# Patient Record
Sex: Male | Born: 1965 | Race: White | Hispanic: No | Marital: Married | Smoking: Former smoker
Health system: Southern US, Community
[De-identification: ages and names within clinical notes are randomized; demographics above are authoritative.]

## PROBLEM LIST (undated history)

## (undated) DIAGNOSIS — Z87442 Personal history of urinary calculi: Secondary | ICD-10-CM

## (undated) DIAGNOSIS — J45909 Unspecified asthma, uncomplicated: Secondary | ICD-10-CM

## (undated) DIAGNOSIS — N200 Calculus of kidney: Secondary | ICD-10-CM

## (undated) DIAGNOSIS — F32A Depression, unspecified: Secondary | ICD-10-CM

## (undated) DIAGNOSIS — F329 Major depressive disorder, single episode, unspecified: Secondary | ICD-10-CM

## (undated) DIAGNOSIS — N289 Disorder of kidney and ureter, unspecified: Secondary | ICD-10-CM

## (undated) DIAGNOSIS — E785 Hyperlipidemia, unspecified: Secondary | ICD-10-CM

## (undated) DIAGNOSIS — E119 Type 2 diabetes mellitus without complications: Secondary | ICD-10-CM

## (undated) DIAGNOSIS — G4730 Sleep apnea, unspecified: Secondary | ICD-10-CM

## (undated) DIAGNOSIS — Z91199 Patient's noncompliance with other medical treatment and regimen due to unspecified reason: Secondary | ICD-10-CM

## (undated) DIAGNOSIS — K219 Gastro-esophageal reflux disease without esophagitis: Secondary | ICD-10-CM

## (undated) DIAGNOSIS — I10 Essential (primary) hypertension: Secondary | ICD-10-CM

## (undated) DIAGNOSIS — E7800 Pure hypercholesterolemia, unspecified: Secondary | ICD-10-CM

## (undated) DIAGNOSIS — Z9119 Patient's noncompliance with other medical treatment and regimen: Secondary | ICD-10-CM

## (undated) HISTORY — PX: STRABISMUS SURGERY: SHX218

## (undated) HISTORY — DX: Disorder of kidney and ureter, unspecified: N28.9

## (undated) HISTORY — PX: KNEE CARTILAGE SURGERY: SHX688

## (undated) HISTORY — PX: EYE SURGERY: SHX253

## (undated) HISTORY — DX: Hyperlipidemia, unspecified: E78.5

## (undated) HISTORY — DX: Personal history of urinary calculi: Z87.442

## (undated) HISTORY — PX: LEG SURGERY: SHX1003

## (undated) HISTORY — PX: CARDIAC CATHETERIZATION: SHX172

## (undated) HISTORY — PX: CERVICAL SPINE SURGERY: SHX589

## (undated) HISTORY — PX: APPENDECTOMY: SHX54

## (undated) HISTORY — DX: Gastro-esophageal reflux disease without esophagitis: K21.9

## (undated) HISTORY — DX: Essential (primary) hypertension: I10

## (undated) HISTORY — PX: HERNIA REPAIR: SHX51

## (undated) HISTORY — PX: CHOLECYSTECTOMY: SHX55

## (undated) HISTORY — PX: SKIN GRAFT FULL THICKNESS LEG: SUR1299

---
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
## 1998-11-05 ENCOUNTER — Emergency Department (HOSPITAL_COMMUNITY): Admission: EM | Admit: 1998-11-05 | Discharge: 1998-11-05 | Payer: Self-pay | Admitting: Emergency Medicine

## 2000-12-01 ENCOUNTER — Emergency Department (HOSPITAL_COMMUNITY): Admission: EM | Admit: 2000-12-01 | Discharge: 2000-12-01 | Payer: Self-pay | Admitting: Emergency Medicine

## 2000-12-02 ENCOUNTER — Emergency Department (HOSPITAL_COMMUNITY): Admission: EM | Admit: 2000-12-02 | Discharge: 2000-12-02 | Payer: Self-pay | Admitting: Emergency Medicine

## 2002-07-22 HISTORY — PX: CHOLECYSTECTOMY: SHX55

## 2002-07-22 HISTORY — PX: APPENDECTOMY: SHX54

## 2002-08-27 ENCOUNTER — Encounter: Payer: Self-pay | Admitting: Emergency Medicine

## 2002-08-27 ENCOUNTER — Emergency Department (HOSPITAL_COMMUNITY): Admission: EM | Admit: 2002-08-27 | Discharge: 2002-08-27 | Payer: Self-pay | Admitting: Emergency Medicine

## 2002-08-29 ENCOUNTER — Inpatient Hospital Stay (HOSPITAL_COMMUNITY): Admission: EM | Admit: 2002-08-29 | Discharge: 2002-08-31 | Payer: Self-pay | Admitting: Emergency Medicine

## 2002-08-29 ENCOUNTER — Encounter: Payer: Self-pay | Admitting: Urology

## 2002-08-30 ENCOUNTER — Encounter: Payer: Self-pay | Admitting: Urology

## 2002-11-02 ENCOUNTER — Emergency Department (HOSPITAL_COMMUNITY): Admission: EM | Admit: 2002-11-02 | Discharge: 2002-11-02 | Payer: Self-pay | Admitting: Emergency Medicine

## 2002-11-15 ENCOUNTER — Emergency Department (HOSPITAL_COMMUNITY): Admission: EM | Admit: 2002-11-15 | Discharge: 2002-11-15 | Payer: Self-pay | Admitting: *Deleted

## 2002-11-15 ENCOUNTER — Encounter: Payer: Self-pay | Admitting: *Deleted

## 2002-11-17 ENCOUNTER — Ambulatory Visit (HOSPITAL_BASED_OUTPATIENT_CLINIC_OR_DEPARTMENT_OTHER): Admission: RE | Admit: 2002-11-17 | Discharge: 2002-11-17 | Payer: Self-pay | Admitting: Urology

## 2003-02-10 ENCOUNTER — Emergency Department (HOSPITAL_COMMUNITY): Admission: EM | Admit: 2003-02-10 | Discharge: 2003-02-10 | Payer: Self-pay | Admitting: Emergency Medicine

## 2003-02-10 ENCOUNTER — Encounter: Payer: Self-pay | Admitting: Emergency Medicine

## 2003-02-28 ENCOUNTER — Emergency Department (HOSPITAL_COMMUNITY): Admission: EM | Admit: 2003-02-28 | Discharge: 2003-02-28 | Payer: Self-pay | Admitting: Emergency Medicine

## 2003-03-29 ENCOUNTER — Encounter: Admission: RE | Admit: 2003-03-29 | Discharge: 2003-06-27 | Payer: Self-pay | Admitting: Family Medicine

## 2003-09-17 ENCOUNTER — Emergency Department (HOSPITAL_COMMUNITY): Admission: EM | Admit: 2003-09-17 | Discharge: 2003-09-17 | Payer: Self-pay | Admitting: Emergency Medicine

## 2003-09-28 ENCOUNTER — Emergency Department (HOSPITAL_COMMUNITY): Admission: EM | Admit: 2003-09-28 | Discharge: 2003-09-28 | Payer: Self-pay | Admitting: Emergency Medicine

## 2003-10-25 ENCOUNTER — Emergency Department (HOSPITAL_COMMUNITY): Admission: EM | Admit: 2003-10-25 | Discharge: 2003-10-25 | Payer: Self-pay | Admitting: Emergency Medicine

## 2003-12-08 ENCOUNTER — Emergency Department (HOSPITAL_COMMUNITY): Admission: EM | Admit: 2003-12-08 | Discharge: 2003-12-08 | Payer: Self-pay | Admitting: Emergency Medicine

## 2004-04-10 ENCOUNTER — Emergency Department (HOSPITAL_COMMUNITY): Admission: EM | Admit: 2004-04-10 | Discharge: 2004-04-10 | Payer: Self-pay | Admitting: Emergency Medicine

## 2004-04-19 ENCOUNTER — Emergency Department (HOSPITAL_COMMUNITY): Admission: EM | Admit: 2004-04-19 | Discharge: 2004-04-19 | Payer: Self-pay | Admitting: Emergency Medicine

## 2004-07-18 ENCOUNTER — Emergency Department (HOSPITAL_COMMUNITY): Admission: EM | Admit: 2004-07-18 | Discharge: 2004-07-19 | Payer: Self-pay | Admitting: Internal Medicine

## 2004-07-25 ENCOUNTER — Emergency Department (HOSPITAL_COMMUNITY): Admission: EM | Admit: 2004-07-25 | Discharge: 2004-07-25 | Payer: Self-pay | Admitting: *Deleted

## 2004-10-03 ENCOUNTER — Emergency Department (HOSPITAL_COMMUNITY): Admission: EM | Admit: 2004-10-03 | Discharge: 2004-10-03 | Payer: Self-pay | Admitting: Emergency Medicine

## 2004-10-17 ENCOUNTER — Emergency Department (HOSPITAL_COMMUNITY): Admission: EM | Admit: 2004-10-17 | Discharge: 2004-10-17 | Payer: Self-pay | Admitting: Emergency Medicine

## 2005-03-09 ENCOUNTER — Emergency Department (HOSPITAL_COMMUNITY): Admission: EM | Admit: 2005-03-09 | Discharge: 2005-03-09 | Payer: Self-pay | Admitting: Emergency Medicine

## 2005-03-14 ENCOUNTER — Emergency Department (HOSPITAL_COMMUNITY): Admission: EM | Admit: 2005-03-14 | Discharge: 2005-03-14 | Payer: Self-pay | Admitting: Emergency Medicine

## 2005-05-30 ENCOUNTER — Emergency Department (HOSPITAL_COMMUNITY): Admission: EM | Admit: 2005-05-30 | Discharge: 2005-05-30 | Payer: Self-pay | Admitting: Emergency Medicine

## 2005-07-15 ENCOUNTER — Emergency Department (HOSPITAL_COMMUNITY): Admission: EM | Admit: 2005-07-15 | Discharge: 2005-07-15 | Payer: Self-pay | Admitting: Emergency Medicine

## 2005-07-16 ENCOUNTER — Emergency Department (HOSPITAL_COMMUNITY): Admission: EM | Admit: 2005-07-16 | Discharge: 2005-07-16 | Payer: Self-pay | Admitting: *Deleted

## 2005-08-26 ENCOUNTER — Emergency Department (HOSPITAL_COMMUNITY): Admission: EM | Admit: 2005-08-26 | Discharge: 2005-08-26 | Payer: Self-pay | Admitting: Emergency Medicine

## 2005-10-04 ENCOUNTER — Emergency Department (HOSPITAL_COMMUNITY): Admission: EM | Admit: 2005-10-04 | Discharge: 2005-10-05 | Payer: Self-pay | Admitting: Emergency Medicine

## 2005-10-06 ENCOUNTER — Emergency Department (HOSPITAL_COMMUNITY): Admission: EM | Admit: 2005-10-06 | Discharge: 2005-10-07 | Payer: Self-pay | Admitting: Emergency Medicine

## 2005-12-02 ENCOUNTER — Emergency Department (HOSPITAL_COMMUNITY): Admission: EM | Admit: 2005-12-02 | Discharge: 2005-12-02 | Payer: Self-pay | Admitting: Emergency Medicine

## 2006-01-02 ENCOUNTER — Emergency Department (HOSPITAL_COMMUNITY): Admission: EM | Admit: 2006-01-02 | Discharge: 2006-01-02 | Payer: Self-pay | Admitting: Emergency Medicine

## 2006-01-05 ENCOUNTER — Emergency Department (HOSPITAL_COMMUNITY): Admission: EM | Admit: 2006-01-05 | Discharge: 2006-01-06 | Payer: Self-pay | Admitting: Emergency Medicine

## 2006-01-28 ENCOUNTER — Emergency Department (HOSPITAL_COMMUNITY): Admission: EM | Admit: 2006-01-28 | Discharge: 2006-01-28 | Payer: Self-pay | Admitting: Emergency Medicine

## 2006-02-22 ENCOUNTER — Emergency Department (HOSPITAL_COMMUNITY): Admission: EM | Admit: 2006-02-22 | Discharge: 2006-02-22 | Payer: Self-pay | Admitting: Emergency Medicine

## 2006-02-28 ENCOUNTER — Emergency Department (HOSPITAL_COMMUNITY): Admission: EM | Admit: 2006-02-28 | Discharge: 2006-02-28 | Payer: Self-pay | Admitting: Emergency Medicine

## 2006-03-04 ENCOUNTER — Emergency Department (HOSPITAL_COMMUNITY): Admission: EM | Admit: 2006-03-04 | Discharge: 2006-03-04 | Payer: Self-pay | Admitting: Emergency Medicine

## 2006-03-06 ENCOUNTER — Emergency Department (HOSPITAL_COMMUNITY): Admission: EM | Admit: 2006-03-06 | Discharge: 2006-03-06 | Payer: Self-pay | Admitting: Emergency Medicine

## 2006-07-01 ENCOUNTER — Emergency Department (HOSPITAL_COMMUNITY): Admission: EM | Admit: 2006-07-01 | Discharge: 2006-07-01 | Payer: Self-pay | Admitting: Emergency Medicine

## 2006-09-05 ENCOUNTER — Emergency Department (HOSPITAL_COMMUNITY): Admission: EM | Admit: 2006-09-05 | Discharge: 2006-09-05 | Payer: Self-pay | Admitting: Emergency Medicine

## 2006-09-13 ENCOUNTER — Emergency Department (HOSPITAL_COMMUNITY): Admission: EM | Admit: 2006-09-13 | Discharge: 2006-09-13 | Payer: Self-pay | Admitting: Emergency Medicine

## 2006-12-03 ENCOUNTER — Emergency Department (HOSPITAL_COMMUNITY): Admission: EM | Admit: 2006-12-03 | Discharge: 2006-12-03 | Payer: Self-pay | Admitting: *Deleted

## 2006-12-19 ENCOUNTER — Emergency Department (HOSPITAL_COMMUNITY): Admission: EM | Admit: 2006-12-19 | Discharge: 2006-12-19 | Payer: Self-pay | Admitting: Emergency Medicine

## 2007-01-19 ENCOUNTER — Emergency Department (HOSPITAL_COMMUNITY): Admission: EM | Admit: 2007-01-19 | Discharge: 2007-01-19 | Payer: Self-pay | Admitting: Emergency Medicine

## 2007-04-05 ENCOUNTER — Emergency Department (HOSPITAL_COMMUNITY): Admission: EM | Admit: 2007-04-05 | Discharge: 2007-04-05 | Payer: Self-pay | Admitting: Emergency Medicine

## 2007-05-07 ENCOUNTER — Emergency Department (HOSPITAL_COMMUNITY): Admission: EM | Admit: 2007-05-07 | Discharge: 2007-05-07 | Payer: Self-pay | Admitting: Emergency Medicine

## 2007-07-29 ENCOUNTER — Emergency Department (HOSPITAL_COMMUNITY): Admission: EM | Admit: 2007-07-29 | Discharge: 2007-07-29 | Payer: Self-pay | Admitting: Emergency Medicine

## 2007-11-26 ENCOUNTER — Encounter: Payer: Self-pay | Admitting: Endocrinology

## 2007-12-01 ENCOUNTER — Encounter: Payer: Self-pay | Admitting: Endocrinology

## 2007-12-08 ENCOUNTER — Telehealth: Payer: Self-pay | Admitting: Endocrinology

## 2007-12-15 ENCOUNTER — Encounter: Payer: Self-pay | Admitting: Endocrinology

## 2007-12-29 ENCOUNTER — Encounter: Payer: Self-pay | Admitting: Endocrinology

## 2008-01-06 ENCOUNTER — Telehealth: Payer: Self-pay | Admitting: Endocrinology

## 2008-01-13 ENCOUNTER — Telehealth: Payer: Self-pay | Admitting: Endocrinology

## 2008-01-19 ENCOUNTER — Telehealth: Payer: Self-pay | Admitting: Endocrinology

## 2008-01-20 ENCOUNTER — Telehealth: Payer: Self-pay | Admitting: Endocrinology

## 2008-01-26 ENCOUNTER — Encounter: Payer: Self-pay | Admitting: Endocrinology

## 2008-01-26 ENCOUNTER — Ambulatory Visit (HOSPITAL_COMMUNITY): Admission: RE | Admit: 2008-01-26 | Discharge: 2008-01-26 | Payer: Self-pay | Admitting: Internal Medicine

## 2008-02-01 ENCOUNTER — Encounter: Payer: Self-pay | Admitting: Endocrinology

## 2008-02-23 ENCOUNTER — Encounter: Payer: Self-pay | Admitting: Endocrinology

## 2008-04-28 ENCOUNTER — Ambulatory Visit: Payer: Self-pay | Admitting: Endocrinology

## 2008-04-28 DIAGNOSIS — R609 Edema, unspecified: Secondary | ICD-10-CM | POA: Insufficient documentation

## 2008-04-28 DIAGNOSIS — E785 Hyperlipidemia, unspecified: Secondary | ICD-10-CM | POA: Insufficient documentation

## 2008-04-28 DIAGNOSIS — Z87442 Personal history of urinary calculi: Secondary | ICD-10-CM | POA: Insufficient documentation

## 2008-04-28 DIAGNOSIS — E109 Type 1 diabetes mellitus without complications: Secondary | ICD-10-CM | POA: Insufficient documentation

## 2008-04-28 DIAGNOSIS — Z9189 Other specified personal risk factors, not elsewhere classified: Secondary | ICD-10-CM | POA: Insufficient documentation

## 2008-04-28 DIAGNOSIS — I00 Rheumatic fever without heart involvement: Secondary | ICD-10-CM | POA: Insufficient documentation

## 2008-04-28 DIAGNOSIS — I10 Essential (primary) hypertension: Secondary | ICD-10-CM | POA: Insufficient documentation

## 2008-05-06 ENCOUNTER — Inpatient Hospital Stay (HOSPITAL_COMMUNITY): Admission: EM | Admit: 2008-05-06 | Discharge: 2008-05-10 | Payer: Self-pay | Admitting: Emergency Medicine

## 2008-05-19 ENCOUNTER — Ambulatory Visit: Payer: Self-pay | Admitting: Endocrinology

## 2008-07-01 ENCOUNTER — Inpatient Hospital Stay (HOSPITAL_COMMUNITY): Admission: AD | Admit: 2008-07-01 | Discharge: 2008-07-04 | Payer: Self-pay | Admitting: Internal Medicine

## 2008-07-01 ENCOUNTER — Ambulatory Visit: Payer: Self-pay | Admitting: Cardiology

## 2008-07-28 ENCOUNTER — Ambulatory Visit: Payer: Self-pay | Admitting: Cardiology

## 2008-09-11 ENCOUNTER — Observation Stay (HOSPITAL_COMMUNITY): Admission: EM | Admit: 2008-09-11 | Discharge: 2008-09-13 | Payer: Self-pay | Admitting: Emergency Medicine

## 2008-09-11 ENCOUNTER — Ambulatory Visit: Payer: Self-pay | Admitting: Cardiology

## 2008-09-12 ENCOUNTER — Encounter (INDEPENDENT_AMBULATORY_CARE_PROVIDER_SITE_OTHER): Payer: Self-pay | Admitting: Family Medicine

## 2008-09-16 ENCOUNTER — Ambulatory Visit: Payer: Self-pay | Admitting: Cardiology

## 2008-10-29 ENCOUNTER — Emergency Department (HOSPITAL_COMMUNITY): Admission: EM | Admit: 2008-10-29 | Discharge: 2008-10-29 | Payer: Self-pay | Admitting: Emergency Medicine

## 2008-11-29 ENCOUNTER — Ambulatory Visit (HOSPITAL_COMMUNITY): Admission: RE | Admit: 2008-11-29 | Discharge: 2008-11-29 | Payer: Self-pay | Admitting: Family Medicine

## 2008-12-20 ENCOUNTER — Emergency Department (HOSPITAL_COMMUNITY): Admission: EM | Admit: 2008-12-20 | Discharge: 2008-12-20 | Payer: Self-pay | Admitting: Emergency Medicine

## 2008-12-27 ENCOUNTER — Inpatient Hospital Stay (HOSPITAL_COMMUNITY): Admission: EM | Admit: 2008-12-27 | Discharge: 2009-01-03 | Payer: Self-pay | Admitting: Emergency Medicine

## 2009-01-02 ENCOUNTER — Ambulatory Visit: Payer: Self-pay | Admitting: Psychiatry

## 2009-01-03 ENCOUNTER — Inpatient Hospital Stay (HOSPITAL_COMMUNITY): Admission: RE | Admit: 2009-01-03 | Discharge: 2009-01-09 | Payer: Self-pay | Admitting: Psychiatry

## 2009-01-18 ENCOUNTER — Emergency Department (HOSPITAL_COMMUNITY): Admission: EM | Admit: 2009-01-18 | Discharge: 2009-01-18 | Payer: Self-pay | Admitting: Emergency Medicine

## 2009-01-24 ENCOUNTER — Emergency Department (HOSPITAL_COMMUNITY): Admission: EM | Admit: 2009-01-24 | Discharge: 2009-01-24 | Payer: Self-pay | Admitting: Emergency Medicine

## 2009-04-20 ENCOUNTER — Emergency Department (HOSPITAL_COMMUNITY): Admission: EM | Admit: 2009-04-20 | Discharge: 2009-04-20 | Payer: Self-pay | Admitting: Emergency Medicine

## 2009-07-25 ENCOUNTER — Emergency Department (HOSPITAL_COMMUNITY): Admission: EM | Admit: 2009-07-25 | Discharge: 2009-07-25 | Payer: Self-pay | Admitting: Emergency Medicine

## 2009-10-30 ENCOUNTER — Emergency Department (HOSPITAL_COMMUNITY): Admission: EM | Admit: 2009-10-30 | Discharge: 2009-10-31 | Payer: Self-pay | Admitting: Emergency Medicine

## 2009-10-31 ENCOUNTER — Inpatient Hospital Stay (HOSPITAL_COMMUNITY): Admission: EM | Admit: 2009-10-31 | Discharge: 2009-11-03 | Payer: Self-pay | Admitting: Psychiatry

## 2009-10-31 ENCOUNTER — Ambulatory Visit: Payer: Self-pay | Admitting: Psychiatry

## 2010-08-07 IMAGING — CR DG FOOT COMPLETE 3+V*R*
3 series · 3 of 3 positions shown · non-contrast
Comparison: None

CLINICAL DATA: Trauma

RIGHT FOOT COMPLETE - 3+ VIEW

[view not recorded (1 of 3)]
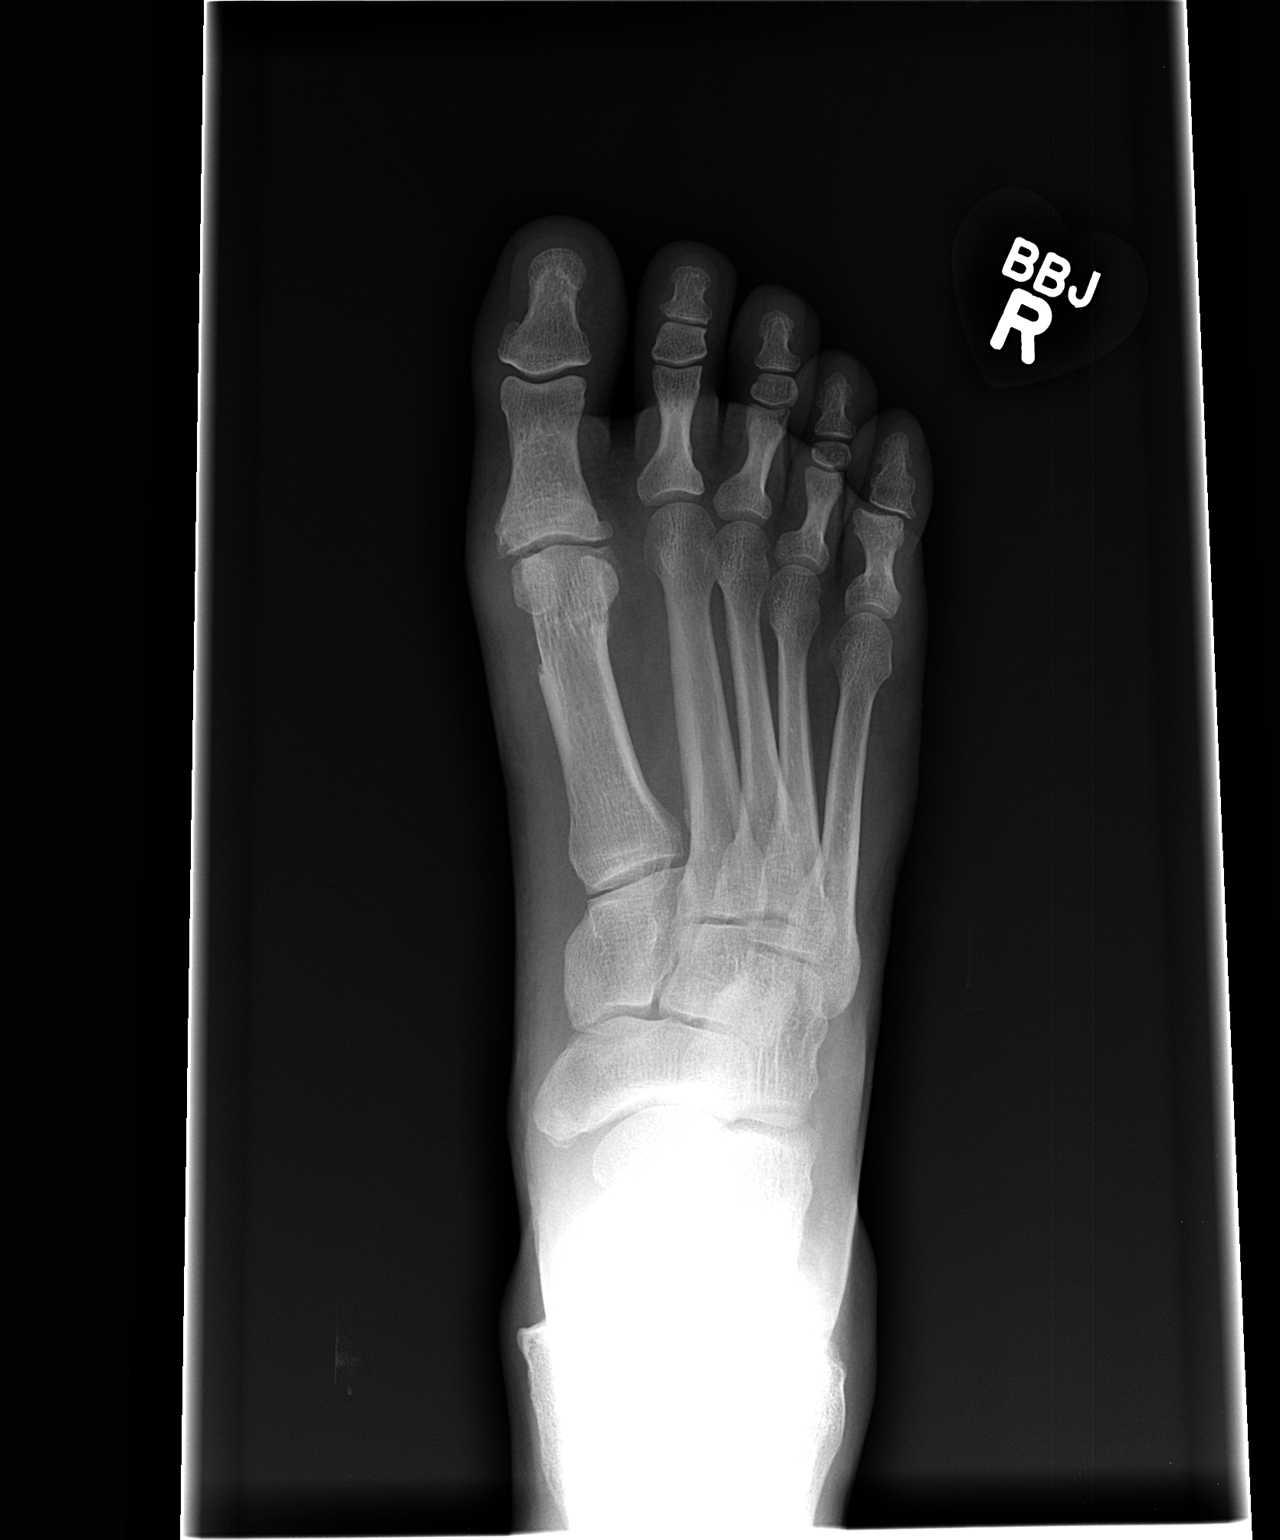

[view not recorded (2 of 3)]
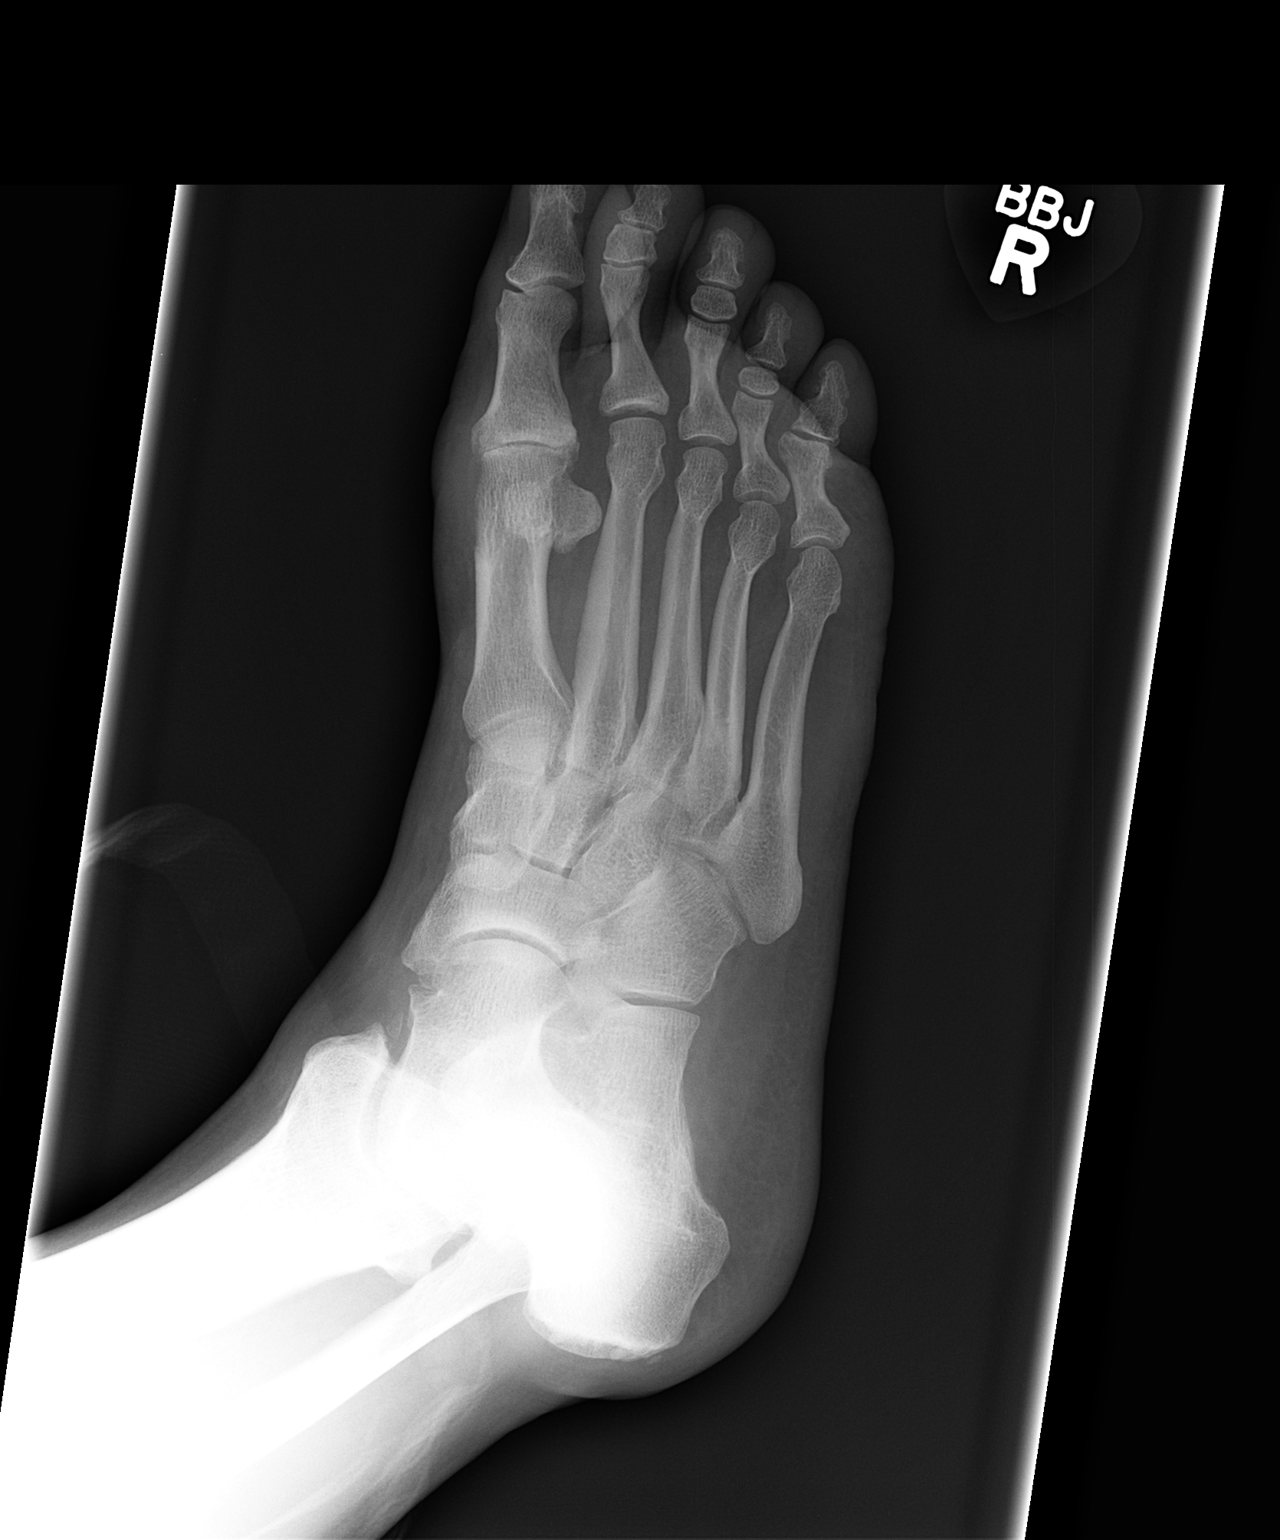

[view not recorded (3 of 3)]
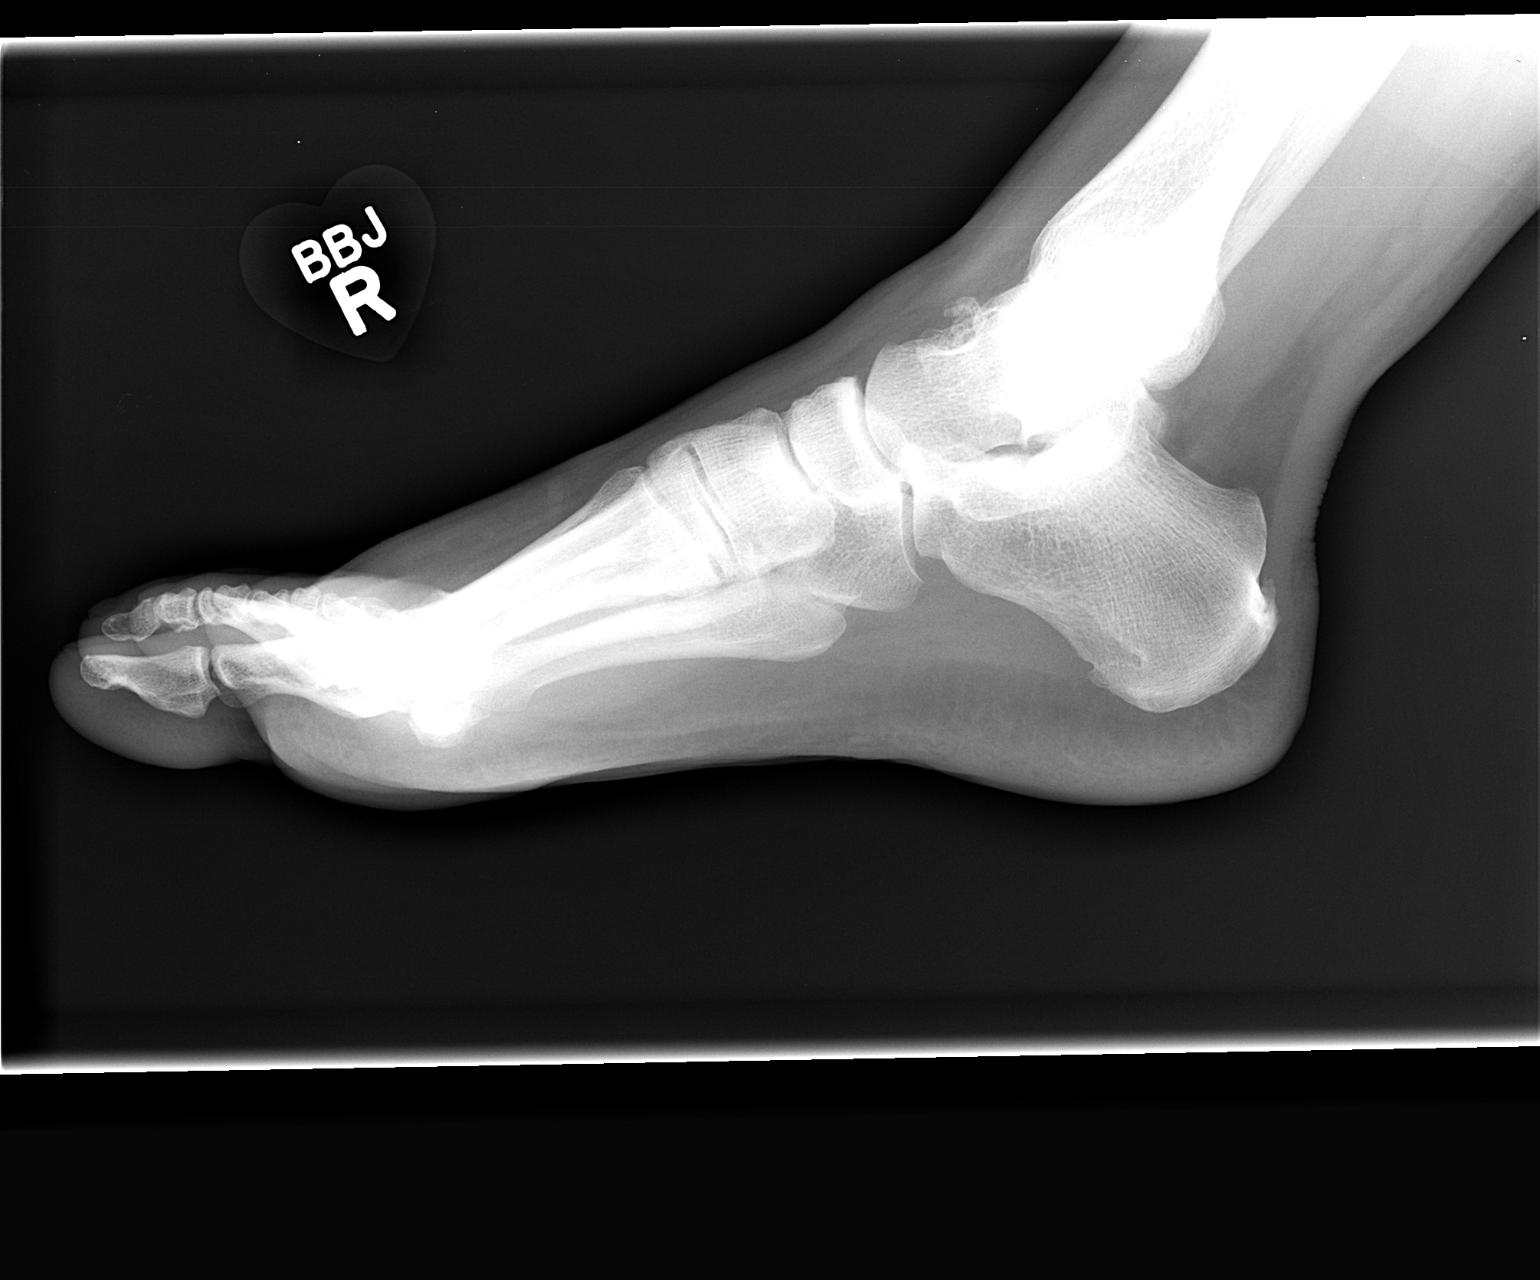

[3 of 3 positions shown; findings below may reference images not displayed]

FINDINGS: First metatarsal phalangeal degenerative changes.  Soft
tissue swelling medially.  No fracture or radiopaque soft tissue
abnormality.
IMPRESSION: Degenerative changes first MTP joint.  No fracture.

## 2010-10-06 LAB — URINALYSIS, ROUTINE W REFLEX MICROSCOPIC
Nitrite: NEGATIVE
Protein, ur: NEGATIVE mg/dL
Specific Gravity, Urine: 1.02 (ref 1.005–1.030)
Urobilinogen, UA: 0.2 mg/dL (ref 0.0–1.0)

## 2010-10-06 LAB — COMPREHENSIVE METABOLIC PANEL
BUN: 16 mg/dL (ref 6–23)
CO2: 27 mEq/L (ref 19–32)
Chloride: 104 mEq/L (ref 96–112)
Creatinine, Ser: 1.08 mg/dL (ref 0.4–1.5)
GFR calc non Af Amer: >60 mL/min (ref >60)
Glucose, Bld: 82 mg/dL (ref 70–99)
Total Bilirubin: 0.3 mg/dL (ref 0.3–1.2)

## 2010-10-06 LAB — GLUCOSE, CAPILLARY
Glucose-Capillary: 224 mg/dL — ABNORMAL HIGH (ref 70–99)
Glucose-Capillary: 78 mg/dL (ref 70–99)

## 2010-10-09 LAB — GLUCOSE, CAPILLARY: Glucose-Capillary: 153 mg/dL — ABNORMAL HIGH (ref 70–99)

## 2010-10-10 LAB — COMPREHENSIVE METABOLIC PANEL
Albumin: 4.1 g/dL (ref 3.5–5.2)
BUN: 14 mg/dL (ref 6–23)
Calcium: 9.5 mg/dL (ref 8.4–10.5)
Creatinine, Ser: 0.97 mg/dL (ref 0.4–1.5)
Glucose, Bld: 235 mg/dL — ABNORMAL HIGH (ref 70–99)
Potassium: 3.5 mEq/L (ref 3.5–5.1)
Total Protein: 7.6 g/dL (ref 6.0–8.3)

## 2010-10-10 LAB — URINALYSIS, ROUTINE W REFLEX MICROSCOPIC
Leukocytes, UA: NEGATIVE
Nitrite: NEGATIVE
Urobilinogen, UA: 0.2 mg/dL (ref 0.0–1.0)
pH: 5 (ref 5.0–8.0)

## 2010-10-10 LAB — CBC
HCT: 45.3 % (ref 39.0–52.0)
Hemoglobin: 15.6 g/dL (ref 13.0–17.0)
MCHC: 34.4 g/dL (ref 30.0–36.0)
MCV: 88.8 fL (ref 78.0–100.0)
Platelets: 258 10*3/uL (ref 150–400)
RDW: 12.9 % (ref 11.5–15.5)

## 2010-10-10 LAB — GLUCOSE, CAPILLARY
Glucose-Capillary: 200 mg/dL — ABNORMAL HIGH (ref 70–99)
Glucose-Capillary: 202 mg/dL — ABNORMAL HIGH (ref 70–99)
Glucose-Capillary: 207 mg/dL — ABNORMAL HIGH (ref 70–99)
Glucose-Capillary: 217 mg/dL — ABNORMAL HIGH (ref 70–99)
Glucose-Capillary: 241 mg/dL — ABNORMAL HIGH (ref 70–99)
Glucose-Capillary: 276 mg/dL — ABNORMAL HIGH (ref 70–99)
Glucose-Capillary: 322 mg/dL — ABNORMAL HIGH (ref 70–99)
Glucose-Capillary: 335 mg/dL — ABNORMAL HIGH (ref 70–99)

## 2010-10-10 LAB — DIFFERENTIAL
Basophils Relative: 1 % (ref 0–1)
Lymphocytes Relative: 40 % (ref 12–46)
Lymphs Abs: 4.1 10*3/uL — ABNORMAL HIGH (ref 0.7–4.0)
Monocytes Absolute: 0.8 10*3/uL (ref 0.1–1.0)
Monocytes Relative: 8 % (ref 3–12)
Neutro Abs: 5.1 10*3/uL (ref 1.7–7.7)
Neutrophils Relative %: 50 % (ref 43–77)

## 2010-10-10 LAB — RAPID URINE DRUG SCREEN, HOSP PERFORMED
Amphetamines: NOT DETECTED
Cocaine: NOT DETECTED
Tetrahydrocannabinol: NOT DETECTED

## 2010-10-10 LAB — ETHANOL: Alcohol, Ethyl (B): <5 mg/dL (ref 0–10)

## 2010-10-28 LAB — BASIC METABOLIC PANEL
BUN: 13 mg/dL (ref 6–23)
Chloride: 105 mEq/L (ref 96–112)
GFR calc Af Amer: >60 mL/min (ref >60)
GFR calc non Af Amer: >60 mL/min (ref >60)
Potassium: 3.8 mEq/L (ref 3.5–5.1)
Sodium: 139 mEq/L (ref 135–145)

## 2010-10-28 LAB — URINALYSIS, ROUTINE W REFLEX MICROSCOPIC
Glucose, UA: 500 mg/dL — AB
Hgb urine dipstick: NEGATIVE
Leukocytes, UA: NEGATIVE
pH: 6 (ref 5.0–8.0)

## 2010-10-28 LAB — CBC
HCT: 44.2 % (ref 39.0–52.0)
Hemoglobin: 15.5 g/dL (ref 13.0–17.0)
MCV: 88.7 fL (ref 78.0–100.0)
Platelets: 255 10*3/uL (ref 150–400)
RBC: 4.98 MIL/uL (ref 4.22–5.81)
WBC: 9.2 10*3/uL (ref 4.0–10.5)

## 2010-10-28 LAB — RAPID URINE DRUG SCREEN, HOSP PERFORMED
Amphetamines: NOT DETECTED
Barbiturates: NOT DETECTED
Cocaine: NOT DETECTED
Opiates: NOT DETECTED

## 2010-10-28 LAB — DIFFERENTIAL
Eosinophils Relative: 2 % (ref 0–5)
Lymphocytes Relative: 30 % (ref 12–46)
Lymphs Abs: 2.7 10*3/uL (ref 0.7–4.0)
Monocytes Relative: 7 % (ref 3–12)

## 2010-10-28 LAB — GLUCOSE, CAPILLARY

## 2010-10-28 LAB — URINE MICROSCOPIC-ADD ON

## 2010-10-28 LAB — ETHANOL: Alcohol, Ethyl (B): <5 mg/dL (ref 0–10)

## 2010-10-29 LAB — CBC
HCT: 40.4 % (ref 39.0–52.0)
HCT: 43.1 % (ref 39.0–52.0)
Hemoglobin: 14.9 g/dL (ref 13.0–17.0)
MCHC: 34.5 g/dL (ref 30.0–36.0)
MCV: 89.8 fL (ref 78.0–100.0)
MCV: 89.9 fL (ref 78.0–100.0)
Platelets: 310 10*3/uL (ref 150–400)
RBC: 4.5 MIL/uL (ref 4.22–5.81)
RBC: 4.77 MIL/uL (ref 4.22–5.81)
WBC: 13.2 10*3/uL — ABNORMAL HIGH (ref 4.0–10.5)
WBC: 7.3 10*3/uL (ref 4.0–10.5)

## 2010-10-29 LAB — GLUCOSE, CAPILLARY
Glucose-Capillary: 160 mg/dL — ABNORMAL HIGH (ref 70–99)
Glucose-Capillary: 182 mg/dL — ABNORMAL HIGH (ref 70–99)
Glucose-Capillary: 194 mg/dL — ABNORMAL HIGH (ref 70–99)
Glucose-Capillary: 207 mg/dL — ABNORMAL HIGH (ref 70–99)
Glucose-Capillary: 247 mg/dL — ABNORMAL HIGH (ref 70–99)
Glucose-Capillary: 295 mg/dL — ABNORMAL HIGH (ref 70–99)
Glucose-Capillary: 385 mg/dL — ABNORMAL HIGH (ref 70–99)
Glucose-Capillary: 102 mg/dL — ABNORMAL HIGH (ref 70–99)
Glucose-Capillary: 107 mg/dL — ABNORMAL HIGH (ref 70–99)
Glucose-Capillary: 117 mg/dL — ABNORMAL HIGH (ref 70–99)
Glucose-Capillary: 121 mg/dL — ABNORMAL HIGH (ref 70–99)
Glucose-Capillary: 122 mg/dL — ABNORMAL HIGH (ref 70–99)
Glucose-Capillary: 129 mg/dL — ABNORMAL HIGH (ref 70–99)
Glucose-Capillary: 130 mg/dL — ABNORMAL HIGH (ref 70–99)
Glucose-Capillary: 131 mg/dL — ABNORMAL HIGH (ref 70–99)
Glucose-Capillary: 132 mg/dL — ABNORMAL HIGH (ref 70–99)
Glucose-Capillary: 135 mg/dL — ABNORMAL HIGH (ref 70–99)
Glucose-Capillary: 137 mg/dL — ABNORMAL HIGH (ref 70–99)
Glucose-Capillary: 137 mg/dL — ABNORMAL HIGH (ref 70–99)
Glucose-Capillary: 144 mg/dL — ABNORMAL HIGH (ref 70–99)
Glucose-Capillary: 144 mg/dL — ABNORMAL HIGH (ref 70–99)
Glucose-Capillary: 144 mg/dL — ABNORMAL HIGH (ref 70–99)
Glucose-Capillary: 144 mg/dL — ABNORMAL HIGH (ref 70–99)
Glucose-Capillary: 148 mg/dL — ABNORMAL HIGH (ref 70–99)
Glucose-Capillary: 153 mg/dL — ABNORMAL HIGH (ref 70–99)
Glucose-Capillary: 156 mg/dL — ABNORMAL HIGH (ref 70–99)
Glucose-Capillary: 160 mg/dL — ABNORMAL HIGH (ref 70–99)
Glucose-Capillary: 160 mg/dL — ABNORMAL HIGH (ref 70–99)
Glucose-Capillary: 170 mg/dL — ABNORMAL HIGH (ref 70–99)
Glucose-Capillary: 181 mg/dL — ABNORMAL HIGH (ref 70–99)
Glucose-Capillary: 185 mg/dL — ABNORMAL HIGH (ref 70–99)
Glucose-Capillary: 191 mg/dL — ABNORMAL HIGH (ref 70–99)
Glucose-Capillary: 208 mg/dL — ABNORMAL HIGH (ref 70–99)
Glucose-Capillary: 212 mg/dL — ABNORMAL HIGH (ref 70–99)
Glucose-Capillary: 225 mg/dL — ABNORMAL HIGH (ref 70–99)
Glucose-Capillary: 236 mg/dL — ABNORMAL HIGH (ref 70–99)
Glucose-Capillary: 240 mg/dL — ABNORMAL HIGH (ref 70–99)
Glucose-Capillary: 274 mg/dL — ABNORMAL HIGH (ref 70–99)
Glucose-Capillary: 48 mg/dL — ABNORMAL LOW (ref 70–99)
Glucose-Capillary: 54 mg/dL — ABNORMAL LOW (ref 70–99)
Glucose-Capillary: 57 mg/dL — ABNORMAL LOW (ref 70–99)
Glucose-Capillary: 58 mg/dL — ABNORMAL LOW (ref 70–99)
Glucose-Capillary: 60 mg/dL — ABNORMAL LOW (ref 70–99)
Glucose-Capillary: 61 mg/dL — ABNORMAL LOW (ref 70–99)
Glucose-Capillary: 62 mg/dL — ABNORMAL LOW (ref 70–99)
Glucose-Capillary: 64 mg/dL — ABNORMAL LOW (ref 70–99)
Glucose-Capillary: 66 mg/dL — ABNORMAL LOW (ref 70–99)
Glucose-Capillary: 75 mg/dL (ref 70–99)
Glucose-Capillary: 76 mg/dL (ref 70–99)
Glucose-Capillary: 79 mg/dL (ref 70–99)
Glucose-Capillary: 81 mg/dL (ref 70–99)
Glucose-Capillary: 83 mg/dL (ref 70–99)
Glucose-Capillary: 84 mg/dL (ref 70–99)
Glucose-Capillary: 84 mg/dL (ref 70–99)
Glucose-Capillary: 87 mg/dL (ref 70–99)
Glucose-Capillary: 93 mg/dL (ref 70–99)
Glucose-Capillary: 95 mg/dL (ref 70–99)
Glucose-Capillary: 99 mg/dL (ref 70–99)
Glucose-Capillary: 185 mg/dL — ABNORMAL HIGH (ref 70–99)
Glucose-Capillary: 210 mg/dL — ABNORMAL HIGH (ref 70–99)

## 2010-10-29 LAB — DIFFERENTIAL
Basophils Relative: 0 % (ref 0–1)
Eosinophils Absolute: 0.1 10*3/uL (ref 0.0–0.7)
Eosinophils Absolute: 0.1 10*3/uL (ref 0.0–0.7)
Eosinophils Absolute: 0.2 10*3/uL (ref 0.0–0.7)
Lymphocytes Relative: 41 % (ref 12–46)
Lymphs Abs: 2.7 10*3/uL (ref 0.7–4.0)
Lymphs Abs: 3 10*3/uL (ref 0.7–4.0)
Lymphs Abs: 3.2 10*3/uL (ref 0.7–4.0)
Monocytes Relative: 13 % — ABNORMAL HIGH (ref 3–12)
Neutro Abs: 7 10*3/uL (ref 1.7–7.7)
Neutrophils Relative %: 44 % (ref 43–77)
Neutrophils Relative %: 63 % (ref 43–77)
Neutrophils Relative %: 69 % (ref 43–77)

## 2010-10-29 LAB — BASIC METABOLIC PANEL
BUN: 11 mg/dL (ref 6–23)
BUN: 5 mg/dL — ABNORMAL LOW (ref 6–23)
BUN: 9 mg/dL (ref 6–23)
BUN: 9 mg/dL (ref 6–23)
CO2: 27 mEq/L (ref 19–32)
CO2: 33 mEq/L — ABNORMAL HIGH (ref 19–32)
Calcium: 9.3 mg/dL (ref 8.4–10.5)
Chloride: 102 mEq/L (ref 96–112)
Chloride: 102 mEq/L (ref 96–112)
Chloride: 107 mEq/L (ref 96–112)
Creatinine, Ser: 0.86 mg/dL (ref 0.4–1.5)
Creatinine, Ser: 0.92 mg/dL (ref 0.4–1.5)
Creatinine, Ser: 0.99 mg/dL (ref 0.4–1.5)
GFR calc Af Amer: >60 mL/min (ref >60)
GFR calc Af Amer: >60 mL/min (ref >60)
GFR calc Af Amer: >60 mL/min (ref >60)
GFR calc non Af Amer: >60 mL/min (ref >60)
Glucose, Bld: 127 mg/dL — ABNORMAL HIGH (ref 70–99)
Potassium: 3.7 mEq/L (ref 3.5–5.1)
Potassium: 3.9 mEq/L (ref 3.5–5.1)
Potassium: 4 mEq/L (ref 3.5–5.1)

## 2010-10-29 LAB — URINALYSIS, ROUTINE W REFLEX MICROSCOPIC
Bilirubin Urine: NEGATIVE
Leukocytes, UA: NEGATIVE
Nitrite: NEGATIVE
Specific Gravity, Urine: >1.03 — ABNORMAL HIGH (ref 1.005–1.030)
Urobilinogen, UA: 0.2 mg/dL (ref 0.0–1.0)
pH: 6 (ref 5.0–8.0)

## 2010-10-29 LAB — COMPREHENSIVE METABOLIC PANEL
ALT: 22 U/L (ref 0–53)
Alkaline Phosphatase: 89 U/L (ref 39–117)
BUN: 11 mg/dL (ref 6–23)
CO2: 26 mEq/L (ref 19–32)
Calcium: 9.7 mg/dL (ref 8.4–10.5)
GFR calc non Af Amer: >60 mL/min (ref >60)
Glucose, Bld: 54 mg/dL — ABNORMAL LOW (ref 70–99)
Sodium: 142 mEq/L (ref 135–145)

## 2010-10-29 LAB — HEMOGLOBIN AND HEMATOCRIT, BLOOD
HCT: 40.2 % (ref 39.0–52.0)
Hemoglobin: 14.1 g/dL (ref 13.0–17.0)

## 2010-10-29 LAB — URINE MICROSCOPIC-ADD ON

## 2010-10-29 LAB — ACETAMINOPHEN LEVEL: Acetaminophen (Tylenol), Serum: <10 ug/mL — ABNORMAL LOW (ref 10–30)

## 2010-10-29 LAB — MAGNESIUM: Magnesium: 1.9 mg/dL (ref 1.5–2.5)

## 2010-10-29 LAB — SALICYLATE LEVEL: Salicylate Lvl: <4 mg/dL (ref 2.8–20.0)

## 2010-10-31 LAB — DIFFERENTIAL
Basophils Absolute: 0 10*3/uL (ref 0.0–0.1)
Basophils Relative: 0 % (ref 0–1)
Neutro Abs: 6.7 10*3/uL (ref 1.7–7.7)
Neutrophils Relative %: 70 % (ref 43–77)

## 2010-10-31 LAB — CBC
HCT: 40.9 % (ref 39.0–52.0)
Hemoglobin: 14.5 g/dL (ref 13.0–17.0)
RDW: 13.4 % (ref 11.5–15.5)

## 2010-10-31 LAB — GLUCOSE, CAPILLARY

## 2010-10-31 LAB — COMPREHENSIVE METABOLIC PANEL
Alkaline Phosphatase: 95 U/L (ref 39–117)
BUN: 11 mg/dL (ref 6–23)
GFR calc non Af Amer: >60 mL/min (ref >60)
Glucose, Bld: 119 mg/dL — ABNORMAL HIGH (ref 70–99)
Potassium: 2.9 mEq/L — ABNORMAL LOW (ref 3.5–5.1)
Total Bilirubin: 0.5 mg/dL (ref 0.3–1.2)
Total Protein: 6.6 g/dL (ref 6.0–8.3)

## 2010-10-31 LAB — LIPASE, BLOOD: Lipase: 31 U/L (ref 11–59)

## 2010-11-06 LAB — COMPREHENSIVE METABOLIC PANEL
ALT: 71 U/L — ABNORMAL HIGH (ref 0–53)
AST: 40 U/L — ABNORMAL HIGH (ref 0–37)
CO2: 24 mEq/L (ref 19–32)
Chloride: 102 mEq/L (ref 96–112)
GFR calc Af Amer: >60 mL/min (ref >60)
GFR calc non Af Amer: >60 mL/min (ref >60)
Sodium: 137 mEq/L (ref 135–145)
Total Bilirubin: 0.4 mg/dL (ref 0.3–1.2)

## 2010-11-06 LAB — MAGNESIUM: Magnesium: 2 mg/dL (ref 1.5–2.5)

## 2010-11-06 LAB — PHOSPHORUS: Phosphorus: 2.6 mg/dL (ref 2.3–4.6)

## 2010-11-06 LAB — HOMOCYSTEINE: Homocysteine: 4.6 umol/L (ref 4.0–15.4)

## 2010-11-06 LAB — HEPATIC FUNCTION PANEL
Albumin: 3.4 g/dL — ABNORMAL LOW (ref 3.5–5.2)
Alkaline Phosphatase: 110 U/L (ref 39–117)
Total Bilirubin: 0.4 mg/dL (ref 0.3–1.2)
Total Protein: 6.5 g/dL (ref 6.0–8.3)

## 2010-11-06 LAB — CARDIAC PANEL(CRET KIN+CKTOT+MB+TROPI)
CK, MB: 1 ng/mL (ref 0.3–4.0)
Relative Index: INVALID (ref 0.0–2.5)
Relative Index: INVALID (ref 0.0–2.5)
Relative Index: INVALID (ref 0.0–2.5)
Total CK: 69 U/L (ref 7–232)
Troponin I: <0.01 ng/mL (ref 0.00–0.06)

## 2010-11-06 LAB — CBC
Hemoglobin: 14.5 g/dL (ref 13.0–17.0)
MCHC: 35 g/dL (ref 30.0–36.0)
Platelets: 241 10*3/uL (ref 150–400)
RBC: 5.03 MIL/uL (ref 4.22–5.81)
RDW: 13.4 % (ref 11.5–15.5)
WBC: 8.1 10*3/uL (ref 4.0–10.5)

## 2010-11-06 LAB — POCT CARDIAC MARKERS: CKMB, poc: <1 ng/mL — ABNORMAL LOW (ref 1.0–8.0)

## 2010-11-06 LAB — DIFFERENTIAL
Basophils Absolute: 0 10*3/uL (ref 0.0–0.1)
Basophils Absolute: 0 10*3/uL (ref 0.0–0.1)
Basophils Relative: 1 % (ref 0–1)
Eosinophils Absolute: 0.1 10*3/uL (ref 0.0–0.7)
Eosinophils Relative: 1 % (ref 0–5)
Lymphocytes Relative: 38 % (ref 12–46)
Lymphs Abs: 2.4 10*3/uL (ref 0.7–4.0)
Monocytes Absolute: 0.8 10*3/uL (ref 0.1–1.0)
Monocytes Absolute: 0.9 10*3/uL (ref 0.1–1.0)
Neutro Abs: 3.9 10*3/uL (ref 1.7–7.7)
Neutrophils Relative %: 49 % (ref 43–77)

## 2010-11-06 LAB — GLUCOSE, CAPILLARY
Glucose-Capillary: 199 mg/dL — ABNORMAL HIGH (ref 70–99)
Glucose-Capillary: 308 mg/dL — ABNORMAL HIGH (ref 70–99)
Glucose-Capillary: 351 mg/dL — ABNORMAL HIGH (ref 70–99)

## 2010-11-06 LAB — LIPID PANEL
HDL: 25 mg/dL — ABNORMAL LOW (ref >39)
Total CHOL/HDL Ratio: 8.5 RATIO
Triglycerides: 516 mg/dL — ABNORMAL HIGH (ref <150)

## 2010-11-06 LAB — BASIC METABOLIC PANEL
GFR calc Af Amer: >60 mL/min (ref >60)
GFR calc non Af Amer: >60 mL/min (ref >60)
Glucose, Bld: 336 mg/dL — ABNORMAL HIGH (ref 70–99)
Potassium: 3.1 mEq/L — ABNORMAL LOW (ref 3.5–5.1)
Sodium: 138 mEq/L (ref 135–145)

## 2010-11-06 LAB — PROTIME-INR
Prothrombin Time: 12.4 seconds (ref 11.6–15.2)
Prothrombin Time: 12.8 seconds (ref 11.6–15.2)
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
## 2010-12-04 NOTE — H&P (Signed)
NAME:  Levi Meyers, NURSE NO.:  [id number]   MEDICAL RECORD NO.:  [id number]          PATIENT TYPE:  INP   LOCATION:  0107                         FACILITY:  [facility]   PHYSICIAN:  Lucita Ferrara, MD         DATE OF BIRTH:  [birth year]   DATE OF ADMISSION:  05/06/2008  DATE OF DISCHARGE:                              HISTORY & PHYSICAL   PRIMARY CARE DOCTOR:  Unassigned. The patient sees a charity care clinic  in [place], [place].   Primary endocrinologist is Dr. Everardo All.   CHIEF COMPLAINT:  Hypoglycemia.   HISTORY OF PRESENT ILLNESS:  The patient 45 year old nondiabetic  presented with low blood sugars here to the emergency room.  The patient  supposedly was in his usual state of health when he presented with blood  sugar that was persistently low, in the range of 29.  The patient  obviously was diaphoretic and symptomatic with changes in his mentation.  He denies recent sources of infection such as; cough, shortness of  breath, urinary frequency, urgency or burning.  The patient has had  adjustments in his insulin and diabetic regimen recently where, I think  he had a reduction of his Lantus from 70 to 60 per discussion with the  endocrinologist, Dr. Everardo All, and he was also stopped on his Avandia.  He had some nausea but no vomiting.  He denies any fevers, chills, chest  pain, shortness of breath.  His p.o. intake had been moderately well.  He also denies any diarrhea, change in bowel habits, constipation.  The  patient denies any pleuritis shortness of breath, cough or fevers.   PAST MEDICAL HISTORY:  Significant for  1. Hypertension.  2. Diabetes.  3. Hyperlipidemia.  4. History of kidney stones in the past.   FAMILY HISTORY:  Noncontributory.   SURGICAL HISTORY:  Status post:  1. Appendectomy.  2. Cholecystectomy.   SOCIAL HISTORY:  Denies drugs, alcohol, or tobacco.  He denies recent  travel.   ALLERGIES:  Allergic to CODEINE.   MEDICATIONS AT  HOME:  Include:  1. Lantus 60 mg daily  2. Lisinopril unknown dose p.o. daily.  Patient is unsure about his medications right now.   REVIEW OF SYSTEMS:  As per the HPI.  Otherwise negative.   PHYSICAL EXAMINATION:  GENERAL:  The patient is in no acute distress.  HEENT: Normocephalic, atraumatic.  Sclerae are anicteric.  NECK: Supple.  No JVD or carotid bruits.  PERRLA.  Extraocular muscles  are intact.  CARDIOVASCULAR:  S1 and S2.  Regular rate and rhythm.  No murmurs, rubs  or gallops.  LUNGS:  Clear to auscultation bilaterally. No wheezes, rales, or  rhonchi.  ABDOMEN:  Soft, nontender, nondistended.  Positive bowel sounds tremors.  EXTREMITIES:  No clubbing, cyanosis or edema.  NEURO:  Patient is alert and oriented x3.  Cranial nerves II through XII  are grossly intact.   LABORATORY DATA:  Basic metabolic panel shows abnormalities including;  potassium 2.4, glucose level down to 61, BUN 10, creatinine 0.75. Blood  alcohol level less than 5.  INR is 1.  CBC shows white count 14.9,  hemoglobin 14.5, hematocrit 42.4, platelet count 300.  Urine drug screen  is essentially negative.  Urinalysis is negative.  Chest x-ray shows a right lower lobe perihilar pneumonia.   ASSESSMENT/PLAN:  A 45 year old with;  1. Hypoglycemia.  2. Chest x-ray showing a right lower lobe perihilar pneumonia.  3. Leukocytosis.  4. hypokalemia   DISCUSSION AND PLAN:  I believe that his hypoglycemia likely persistent  from his not controlled and optimized diabetic regimen.  This could also  be exacerbated for infection which is present in his lung. Will admit  him to the medical telemetry unit for initiate him on IV fluids, and  will monitor his blood sugars every 6 hours x24 hours without any  coverage. Then will start his coverage without Lantus. Of note, I had a  discussion with Dr. Everardo All.  Will need earlier  follow-up with Dr.  Everardo All than later.  For his pneumonia will cover for  community-acquired  pneumonia with Zithromax and Rocephin.  Will get a set of blood  cultures, UA and urine culture. Will need to replete his potassium both  orally and through the intravenous line.  Will need to do diabetic  teaching, although I think earlier follow up with endocrinology, Dr.  Everardo All, is probably the best solution. The patient may eventually need  a different regimen or perhaps if he is compliant.  The rest of the  plans are really dependent on his progress, DVT and GI prophylaxis with  Lovenox and Protonix.      Lucita Ferrara, MD  Electronically Signed     RR/MEDQ  D:  05/06/2008  T:  05/06/2008  Job:  [id number]

## 2010-12-04 NOTE — Discharge Summary (Signed)
NAME:  Levi Meyers, Levi Meyers NO.:  [id number]   MEDICAL RECORD NO.:  [id number]          PATIENT TYPE:  INP   LOCATION:  2036                         FACILITY:  [facility]   PHYSICIAN:  Jesse Sans. Wall, MD, FACCDATE OF BIRTH:  15-Nov-1965   DATE OF ADMISSION:  07/01/2008  DATE OF DISCHARGE:  07/04/2008                               DISCHARGE SUMMARY   PRIMARY CARDIOLOGIST:  Maisie Fus C. Wall, MD, [facility].   DISCHARGE DIAGNOSIS:  Atypical non-cardiac chest pain.   SECONDARY DIAGNOSES:  1. Hyperlipidemia.  2. Hypertension.  3. Diabetes mellitus, insulin dependent.   ALLERGIES:  The patient is allergic to CODEINE.   PROCEDURES PERFORMED:  During this hospitalization, the patient had  electrocardiogram performed on July 01, 2008, that showed normal  sinus rhythm with a rate of 83, no acute ST changes.  The patient had an  electrocardiogram performed on July 02, 2008, normal sinus rhythm  with a rate of 83 with some non-specific T-wave abnormalities, no  significant change from July 01, 2008, and the patient had a left  heart catheterization on July 04, 2008, that showed minimal non-  obstructive coronary artery disease and an ejection fraction of  approximately 65%.   BRIEF HISTORY OF PRESENT ILLNESS:  [name] is a 45 year old white male  with a history of hypertension, diabetes mellitus, and hyperlipidemia,  who was seen in [facility] complaining of chest tightness.  He  has had multiple episodes in the last couple of months, but he has not  complained to any one including his PMDs until July 01, 2008.  These  spells  are associated with chest tightness, pressure, shortness of  breath, and last up to 30 minutes.  His latest episode lasted 30 minutes  when he was playing with his child the night previous to July 01, 2008.  He took 2 nitroglycerin with some relief.   HOSPITAL COURSE:  The patient was admitted to tele and had one set of  negative cardiac enzymes, but due to his risk factors and the nature of  his chest pain, it was felt best to do a diagnostic heart  catheterization, which was completed on July 04, 2008, that showed  minimal non-obstructive coronary artery disease.  The patient had a  negative D-dimer on July 04, 2008, as well, and it was determined  that the chest pain was non-cardiac and not associated with pulmonary  embolism and therefore could be followed on outpatient basis.  The  patient's chest pain had resolved prior to discharge.  Vital signs at  discharge, blood pressure 123/83, O2 saturation 95% on room air,  respirations 20, pulse was 88.   DISCHARGE LABS:  The patient's sodium was 135, potassium 3.3, chloride  105, CO2 24, BUN 13, creatinine 1.05, glucose 206, calcium 8.9.  D-dimer  less than 0.22.   FOLLOWUP APPOINTMENTS:  The patient is scheduled to see Dr. Daleen Squibb on  July 28, 2007, at 10:45 a.m.   DISCHARGE MEDICATIONS:  The patient was given a one-time dose of  potassium 40 mEq by mouth.  The patient  will go home on his home  medications, which are,  1. Lantus 30 units subcu at bedtime.  2. Lisinopril 5 mg p.o. daily.  3. Asa 81 mg p.o. daily.  4. Lipitor 10 mg p.o. daily.  5. Glipizide 5 mg p.o. daily.   DURATION OF DISCHARGE ENCOUNTER:  45 minutes including physician time.      Jarrett Ables, PAC      Thomas C. Daleen Squibb, MD, [facility]  Electronically Signed    MS/MEDQ  D:  07/04/2008  T:  07/05/2008  Job:  [id number]

## 2010-12-04 NOTE — Assessment & Plan Note (Signed)
[facility]                       Levi CARDIOLOGY OFFICE NOTE   Levi, Meyers                        MRN:          [id number]  DATE:09/16/2008                            DOB:          August 22, 1965    Levi Meyers comes in today for followup.   PROBLEM LIST:  1. Nonobstructive coronary artery disease.  2. Labile hypertension.  3. Type 2 diabetes.  4. Hyperlipidemia.   He just saw Dr. Phillips Odor yesterday and was asked to start Norvasc 5 mg  per day and metoprolol 25 mg p.o. b.i.d.  This was suggested after Mr.  Meyers states that he had some chest discomfort and his wife said he  looked pale during a meal recently.  He really has not had any true  angina per se.   His meds with the same as last time except he has been asked to start:  1. Norvasc 5 mg per day.  2. Metoprolol 25 b.i.d.  3. He carries sublingual nitroglycerin.   PHYSICAL EXAMINATION:  VITAL SIGNS:  His blood pressure today was  118/80, his pulse is 92 and regular, and his weight is 198.  GENERAL:  He is in no acute distress.  SKIN:  Warm and dry.  HEENT:  Unchanged.  NECK:  Carotid upstrokes are equal bilateral without bruits.  No JVD.  Thyroid is not enlarged.  Trachea is midline.  LUNGS:  Clear to auscultation and percussion.  HEART:  A nondisplaced PMI.  Soft S1 and S2.  No chest wall tenderness.  ABDOMEN:  Soft.  Good bowel sounds.  EXTREMITIES:  No cyanosis, clubbing, or edema.  Pulses are intact.  NEUROLOGIC:  Intact.   EKG was repeated and shows normal sinus rhythm with nonspecific ST-  segment changes in 1 and AVL.  There has been no acute changes.   ASSESSMENT:  1. Noncardiac chest pain.  2. Labile hypertension.   With his heart rate being in the 90s today, and his blood pressure being  on the low side, I asked him to just start the metoprolol 25 b.i.d.  We  will hold off on the Norvasc for now.  He may need this in the future.     Levi C. Daleen Squibb, MD,  [facility]  Electronically Signed    TCW/MedQ  DD: 09/16/2008  DT: 09/16/2008  Job #: [id number]   cc:   Dr. Phillips Odor

## 2010-12-04 NOTE — Discharge Summary (Signed)
NAME:  Levi Meyers, Levi Meyers NO.:  [id number]   MEDICAL RECORD NO.:  [id number]          PATIENT TYPE:  OBV   LOCATION:  A337                          FACILITY:  [facility]   PHYSICIAN:  Dorris Singh, DO    DATE OF BIRTH:  12/21/1965   DATE OF ADMISSION:  09/11/2008  DATE OF DISCHARGE:  02/23/2010LH                               DISCHARGE SUMMARY   ADMISSION DIAGNOSES:  1. Chest pain.  2. Hypertension.  3. Insulin-dependent diabetes.   DISCHARGE DIAGNOSES:  1. Chest pain, noncardiac origin.  2. Hypercholesterolemia.  3. Hypertriglyceridemia.  4. Hypertension.  5. Insulin-dependent diabetes.  6. Gastroesophageal reflux disease.  7. Elevated liver function tests.   His H and P was done by Dr. Elige Radon.  His chronic care physician is Dr.  Phillips Odor.  Testing that was done includes:  On February 21 he had a  portable chest x-ray which demonstrated marked enlargement of the  pericardial silhouette and a low __________ volume.  On the 22nd he had  an echocardiogram done which demonstrated overall left ventricular  systolic function was normal.  Left ventricle ejection fraction was  estimated range being 55-60%.  There are no left ventricular regional  wall motion abnormalities.  There is mild flattening of the  intervertebral septum, and the right ventricle was mildly dilated.   HOSPITAL COURSE:  The patient was admitted to the service of [facility]  with the above diagnoses.  He was started on a beta blocker, IV  hydration, and placed on all of his home medication.  Dr. Dietrich Pates was  consulted to see him.  He saw him today, and put him on Norvasc as well  as a PPI and read his echo.  His lipids came back with his triglycerides  elevated and his total cholesterol still high.  At this point in time we  will increase his simvastatin, and have him follow up with his primary  care physician for an addition of either a strictly triglyceride  medication or continue with the  combo, and use diet and exercise to help  adjust the other results.  Patient also had elevated LFTs it was noted  upon admission.  However, they were trending down and were almost back  to baseline at discharge.  The patient will be sent home today with the  following instructions.  He is to take his old medications which  include:   1. Lantus 34 units subcu daily.  2. Lisinopril 20 mg twice a day.  3. Simvastatin.  We are increasing that particular one from 40 mg      daily to 80 mg.  4. Glipizide 10 mg twice a day.  5. Celexa 40 mg twice a day.  6. It is recommended that he take an aspirin 81 mg p.o. daily.  7. Norvasc 5 mg p.o. daily.  8. Protonix 40 mg p.o. daily.   He is to follow up with Dr. Phillips Odor and cardiology.  We recommend Dr.  Phillips Odor this week due to his recent hospitalization and with his  cardiologist within 1 month.   DISCHARGE PLANNING:  Includes his instructions.  He will be discharged  today, and he will need to see his primary care physician.  Prior to  going back to work he will increase his activity slowly.  He will need a  diabetic, heart-healthy diet, and he is to return if symptoms worsen.   CONDITION:  Stable.   DISPOSITION:  Will be to home.      Dorris Singh, DO  Electronically Signed     CB/MEDQ  D:  09/13/2008  T:  09/13/2008  Job:  [id number]
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
## 2010-12-04 NOTE — H&P (Signed)
NAME:  KRISTOF, NADEEM NO.:  [id number]   MEDICAL RECORD NO.:  [id number]          PATIENT TYPE:  INP   LOCATION:  IC04                          FACILITY:  [facility]   PHYSICIAN:  Margaretmary Dys, M.D.DATE OF BIRTH:  [birth year]   DATE OF ADMISSION:  12/27/2008  DATE OF DISCHARGE:  LH                              HISTORY & PHYSICAL   ADMISSION DIAGNOSES:  1. Drug overdose with Lantus, possibly Victoza.  2. Severe depression with suicidal thoughts, for awhile.  3. Hypokalemia.  4. Poorly controlled type 2 diabetes.   CHIEF COMPLAINT:  The patient today injected himself with unknown dose  of insulin, Lantus, last night about 8 o'clock.   HISTORY OF PRESENT ILLNESS:  Mr. Loeber is a 45 year old male who has a 9-  year history of diabetes mellitus type 2.  Patient has a history of poor  control but recently his blood sugars have some slightly improved.  The  patient reports having significant stresses and anxiety lately,  especially with his wife.  He also reports a fair amount of depression.   The patient said it got to a height last night when they exchanged  words.  There was no physical incident.  However, the patient went into  the room and then picked up his insulin pen and injected himself with  several with multiple pens.  The patient does not know how much he took  or the strength of the pens.  The patient reports being suicidal,  feeling very stressed lately.  The patient was then brought to the  emergency room. On arrival here, the patient was awake, alert and not in  any acute distress.  The patient's blood sugars have been ranging  between the 40s and 60s.  The patient has received multiple doses of 50%  dextrose.  The patient denies any prior history of suicide attempt or  any intention or plan to do this last night.   REVIEW OF SYSTEMS:  As mentioned in history of present illness.   PAST MEDICAL HISTORY:  1. Depression.  2. Type 2 diabetes.   SOCIAL HISTORY:  The patient is married.  He lives with lives with his  wife. Does not smoke, does not drink alcohol.  Denies any substance  abuse.  Has one child who is 53 years old.  They have been married for  17 years.  The patient reports significant marital distress at this  point.   FAMILY HISTORY:  Mother had an MI at age 83 and also a stroke.   MEDICATIONS:  The patient's medications are listed as:  1. Lantus insulin, exact dose is not known, but last discharge summary      back in February 2010 reports the Lantus is 34 units subcutaneously      once a day.  2. Lisinopril 20 mg p.o. twice a day.  3. Simvastatin 80 mg p.o. once a day.  4. Glipizide 10  mg p.o. b.i.d.  5. Celexa 40 mg p.o. b.i.d.  6. Aspirin 81 mg p.o. once a day.  7. Norvasc 5 mg p.o. daily.  8. Protonix 40 mg p.o. once a day.   ALLERGIES:  CODEINE.   PHYSICAL EXAMINATION:  The patient was conscious, alert, comfortable,  not in acute distress, well-oriented in time, place and person.  VITAL SIGNS: Blood pressure is 120/60, pulse is 108, respirations 22,  temperature 98.4 degrees Fahrenheit. Oxygen saturation was 96% on room  air.  HEENT EXAM:  Normocephalic, atraumatic.  Oral mucosa was moist.  No  exudates were noted.  NECK:  Supple.  No JVD or lymphadenopathy.  LUNGS:  Clear clinically with good air entry bilaterally.  HEART: S1, S2 regular.  No S3, S4, gallops or rubs.  ABDOMEN:  Obese but soft, nontender.  Bowel sounds positive.  EXTREMITIES: No edema.  CNS EXAM:  Grossly intact with no focal neurological deficits.   LABORATORY AND DIAGNOSTIC DATA:  White blood cell count was 11,000,  hemoglobin of 14.9, hematocrit 43.1, platelet count was 314, glucose 99.  Sodium is 142, potassium is 3.0,  chloride 107, CO2 was 26, glucose of  54, BUN of 11, creatinine was 0.83.  Liver function tests were normal.  Acetaminophen level was less than 10, salicylate level was less than 4.  Alcohol level was less than  5.  Urine specific gravity was high at 1.03.  Urine glucose is also positive.  Protein was also positive.   ASSESSMENT/PLAN:  This is a 45 year old male who was admitted through  the emergency room after an intentional overdose.  The patient injected  itself of unknown amount of Lantus.  The patient has been having  episodes of hypoglycemia, although he has not been symptomatic.  The  patient has been getting 50% dextrose and has been given a regular diet.  1. Admit the patient to the intensive care unit to monitor very      closely.  2. Will put the patient on suicide precautions and suicide watch.  3. Will initiate the patient on 5% dextrose infusion at 75 mL/hour.  4. Will check his blood sugars every hour.  If blood sugars go above      250 to 300, will stop his dextrose infusion.  5. Will hold all hypoglycemic agents at this time.  6. The patient will be seen by the ACT team, in preparation for when      he is medically cleared as the patient will need psychiatric      evaluation.  7. Will replace his potassium.   I have discussed the above plan with the patient who verbalized full  understanding.     Margaretmary Dys, M.D.  Electronically Signed    AM/MEDQ  D:  12/28/2008  T:  12/28/2008  Job:  [id number]

## 2010-12-04 NOTE — H&P (Signed)
NAME:  Levi Meyers, MELLS NO.:  [id number]   MEDICAL RECORD NO.:  [id number]          PATIENT TYPE:  OBV   LOCATION:  IC05                          FACILITY:  [facility]   PHYSICIAN:  Dorris Singh, DO    DATE OF BIRTH:  05-Feb-1966   DATE OF ADMISSION:  09/11/2008  DATE OF DISCHARGE:  LH                              HISTORY & PHYSICAL   CHIEF COMPLAINT:  The patient is a 45 year old white male who presented  to [facility] Emergency Room with a chief complaint of chest pain.   He recently was seen back in December by [facility] in which he  had cardiac cath that was done that did  not find any pertinent  positives.  He was then discharged.  He has been following up with  [facility] office since that time.  He came in today complaining  of chest pain.  He was given Nitro and the pain went away, so we  readmitted him for rule out.   PAST MEDICAL HISTORY:  1. Hypertension.  2. Diabetes.  3. Hyperlipidemia.  4. History of kidney stones in the past.   PAST SURGICAL HISTORY:  1. Appendectomy.  2. Cholecystectomy.   FAMILY HISTORY:  History of his mother having an MI in her 13s.   MEDICATIONS:  1. Lantus 34 units subcutaneously once a day.  2. Lisinopril 20 mg p.o. twice a day.  3. Simvastatin.  No dose given.  4. Glipizide.  No dose given.  5. Celexa.  No dose given.   ALLERGIES:  CODEINE.   SOCIAL HISTORY:  He denies drugs, alcohol or tobacco use.  He is  currently unemployed.  He is married and has one child.   REVIEW OF SYSTEMS:  CONSTITUTIONAL:  Negative for changes in appetite.  EYES:  Negative for changes in vision.  HEAD:  Positive for migraine  headaches in the past.  EARS, NOSE, MOUTH, THROAT:  All negative.  CHEST:  Positive for chest pain.  CARDIOVASCULAR:  Positive for chest  pain.  RESPIRATORY:  Positive for dyspnea.  GI: Negative for nausea,  vomiting, abdominal pain or constipation.  MUSCULOSKELETAL:  Negative  for arthralgias,  arthritis.  NEURO:  Negative for any loss of  consciousness.   PHYSICAL EXAMINATION:  VITAL SIGNS:  Blood pressure 165/99, heart rate  80, respirations 20.  GENERAL:  The patient is a 45 year old Caucasian male who is well-  developed, well-nourished, in no acute distress.  HEENT:  No scleral icterus or a conjunctival injection.  Ears, nose,  mouth and throat:  No erythema noted.  NECK:  Supple.  No lymphadenopathy.  HEART:  Regular rate and rhythm.  S1 and S2 present.  No murmurs,  gallops or rubs.  CHEST:  Clear to auscultation bilaterally.  No wheezes, rales or  rhonchi.  ABDOMEN:  Soft, nontender, nondistended.  Bowel sounds in all for  quadrants.  EXTREMITIES:  Positive pulses.  No edema, ecchymosis or cyanosis.  NEURO:  Cranial nerves II-XII are grossly intact.  The patient is alert  and oriented x3.   CURRENT LABORATORY  DATA:  White count of 8.1, hemoglobin 15.4,  hematocrit for 4.6, platelet count 247,000.  Sodium 137, potassium 3.6,  chloride 102, carbon dioxide 24, glucose 334, BUN 8, creatinine 1.05.  First set of cardiac markers was negative.  INR is 0.9.   ASSESSMENT/PLAN:  1. Chest pain.  2. Hypertension.  3. Insulin-dependent diabetes.   PLAN:  Plan will be to admit the patient to service of [facility].  Will  go ahead and place him on observation.  Due to the fact that he has had  a recent cardiac catheter, will still have cardiology come and consult  and participate regarding his recent chest pain.  Will also have been  sublingual nitroglycerin available for the patient as well.  The  patient's blood pressure is elevated.  Will put Nitrol paste on him to  see if that improves.  We will also continue to monitor that and get  that under better control.  Will place him on his home medications.  Will have the patient obtain a dose of the medications we do not have.  Also will place him on sliding scale coverage. Will do DVT and GI  prophylaxis and continue to  monitor the patient and make changes as  necessary.      Dorris Singh, DO  Electronically Signed     CB/MEDQ  D:  09/12/2008  T:  09/12/2008  Job:  [id number]

## 2010-12-04 NOTE — Cardiovascular Report (Signed)
NAME:  Levi Meyers, BUTTRAM NO.:  [id number]   MEDICAL RECORD NO.:  [id number]          PATIENT TYPE:  INP   LOCATION:  2036                         FACILITY:  [facility]   PHYSICIAN:  Everardo Beals. Juanda Chance, MD, FACCDATE OF BIRTH:  1965/08/25   DATE OF PROCEDURE:  07/04/2008  DATE OF DISCHARGE:                            CARDIAC CATHETERIZATION   PRIMARY CARE Trevin Gartrell:  [facility] [place]   CARDIOLOGIST:  Jesse Sans. Wall, MD, [facility]   CLINICAL HISTORY:  Mr. [name] is 45 years old and has no prior history of  known heart disease, although he does have diabetes, hypertension, and  hyperlipidemia.  He had an accident related to hypoglycemia and has not  been able to drive and lost his job about a year ago.  Recently, he  developed symptoms of chest tightness and was seen in consultation by  [name] and was scheduled for catheterization today.   PROCEDURE:  The procedure was performed via the right femoral arterial  sheath, and 5-French Performa coronary catheters.  A front wall arterial  puncture was performed and Omnipaque contrast was used.  The patient  tolerated the procedure well and left the laboratory in satisfactory  condition.   RESULTS:  Left main coronary artery:  The left main coronary artery is  free of disease.   Left anterior descending artery:  The left anterior descending artery  was irregular mainly in its proximal portion and there was 30% proximal  narrowing.  This was after the first diagonal branch.  The LAD gave rise  to two diagonal branches and several septal perforators.   Circumflex artery:  The circumflex artery gave rise to a small marginal  branch, atrial branch, and a posterolateral branch.  There was 30%  ostial stenosis in the circumflex artery but no other significant  obstruction.   Right coronary artery:  The right coronary artery was a moderately large  vessel gave rise to a conus branch, 2 right ventricular branch,  posterior  descending branch, and posterolateral branch.  There are  irregularities in this vessel, but no significant obstruction.   Left ventriculogram:  The left ventriculogram performed in the RAO  projection showed  vigorous wall motion with no areas of hypokinesis.  The estimated fraction was 65%.   The aortic pressure was 112/78 with mean of 94 and left neck pressure  was 112/23.   CONCLUSION:  Mild nonobstructive coronary artery disease with 30%  narrowing in the proximal LAD, 30% ostial stenosis in the circumflex  artery, irregularities in the right coronary, and normal LV function.   RECOMMENDATIONS:  In view of the these finding, I think the patient's  recent symptoms are not likely ischemic.  We will check a D-dimer and  decide about further evaluation of his chest discomfort.      Bruce Elvera Lennox Juanda Chance, MD, [facility]  Electronically Signed     BRB/MEDQ  D:  07/04/2008  T:  07/04/2008  Job:  [id number]   cc:   Madelin Rear. Sherwood Gambler, MD  Michela Pitcher, PharmD

## 2010-12-04 NOTE — Discharge Summary (Signed)
NAME:  Levi Meyers, Levi Meyers NO.:  [id number]   MEDICAL RECORD NO.:  [id number]          PATIENT TYPE:  INP   LOCATION:  A321                          FACILITY:  [facility]   PHYSICIAN:  Osvaldo Shipper, MD     DATE OF BIRTH:  1965/08/30   DATE OF ADMISSION:  12/27/2008  DATE OF DISCHARGE:  06/13/2010LH                               DISCHARGE SUMMARY   DISPOSITION:  The patient is being transferred to [facility] for  inpatient psychiatric evaluation.   PRIMARY CARE PHYSICIAN:  Dr. Phillips Odor.   HISTORY:  Please review the history and physical dictated at the time of  admission for details regarding the patient's present illness.   DISCHARGE DIAGNOSES:  1. Intentional overdose with Lantus.  2. History of depression.  3. Insulin-dependent diabetes.  4. History of hypertension.  5. History of anxiety disorder.   HOSPITAL COURSE:  Briefly, this is a 45 year old Caucasian male who has  a past medical history of diabetes and hypertension, who presented to  the hospital after he overdosed on Lantus.  He got into an argument with  his wife, which prompted this action.  The patient was profoundly  hypoglycemic and so he was admitted to the hospital.  He was on a D-10  drip for almost two to three days.  He was taken off of the D-10 drip  yesterday, and his blood sugars have been stable.  They are running 147,  129, somewhere between 120 and 160.  His hemoglobin A1c unfortunately  was not checked during this admission.   He had hypokalemia when he came in.  That also was corrected  aggressively.  Acetaminophen and salicylate levels were non-toxic.  His  alcohol level was 10.5 as well.   The patient has been very cooperative here during this admission.  He  had some left back pain yesterday.  He was recently diagnosed with a  kidney stone, and he required a dose of morphine overnight and has not  required any since then.   At this time I am going to leave him off of his  insulin.  I have told  him to continue checking his CBGs twice a day, and if his blood sugars  go above 200, to start Lantus only at 5 units every night, and then go  up by 5 units every other day, if the blood sugars remain at about 200,  and to call his doctor, to be seen within the next one week.   CONDITION ON DISCHARGE:  On the day of discharge the patient is feeling  well.  Apart from the back pain that he had last night, he does not have  any other complaints.  He is eating well.  No nausea or vomiting.  His  vital signs are all stable.  His blood pressure was 93/59 this morning.  His lungs are clear to auscultation.  Cardiovascular:  S1 and S2 are  regular.   DISCHARGE MEDICATIONS:  1. Aspirin 81 mg daily.  2. Lisinopril 10 mg twice daily.  3. Niaspan 500 mg three times daily.  4. Simvastatin 40 mg daily.  5. Metoprolol 25 mg b.i.d.  6. Citalopram 20 mg daily.   NOTATION:  1. He has been asked to discontinue Victoza for now.  2. Lantus as instructed to him.  I have told him to stay off of Lantus      for now until his blood sugars go above 200.  Then he can start it      at 5 units q.h.s. and increase by 5 units q.o.d. if his blood      sugars remain above 200.   FOLLOWUP:  1. To follow up with Dr. Phillips Odor within one week.  2. To follow up with his endocrinologist, Dr. Alan Mulder  in      the next few weeks.   DISCHARGE DIET:  A modified carbohydrate diet.   DISCHARGE ACTIVITIES:  No restrictions to physical activity.   CONSULTATIONS:  None obtained during this admission.   PROCEDURE:  No procedures done.   X-RAYS:  He did not have any imaging studies.   The total time of this encounter was 35 minutes.   Addendum: CBG's have been greater than 200 the last three occasions.  Will resume Lantus 5 Units at bedtime daily. Further instructions as  above.      Osvaldo Shipper, MD  Electronically Signed     GK/MEDQ  D:  01/01/2009  T:  01/01/2009  Job:   [id number]   cc:   Corrie Mckusick, M.D.  Fax: [phone]   Alan Mulder, M.D.  Fax: [phone]

## 2010-12-04 NOTE — Assessment & Plan Note (Signed)
[facility]                       [facility] OFFICE NOTE   XIAN, ALVES                        MRN:          [id number]  DATE:07/28/2008                            DOB:          Jun 22, 1966    Mr. Gnau comes in today after being admitted to the hospital at [facility] via our clinic here in [place] for unstable angina.   Catheterization showed a 30% ostial lesion in the circumflex, 30%  narrowing in the proximal LAD, and irregularities in right coronary  artery.  He had normal left ventricular systolic function, EF 65%.  His  pressures were normal in the lab.   He has multiple risk factors as outlined in his discharge summary, not  to mention in my office note and admission note.   He also had a D-dimer checked which was normal.   MEDICATIONS:  He is currently on,  1. Simvastatin 40 mg nightly.  2. Glipizide 10 mg p.o. b.i.d.  3. Lisinopril 10 mg per day.  4. Lantus 30 units nightly.  5. Aspirin 81 mg a day.  6. Nitroglycerin p.r.n.   He has had no further chest pain.  He does not take any nitro.   PHYSICAL EXAMINATION:  His blood pressure is 120/90, his pulse 68 and  regular, his weight is 196.  Rest of the exam is unchanged.  His right  groin is stable.   His biggest concern is his blood sugars running from 130 up to 400.  He  recently had a change in his glipizide at the [facility].  It sounds  like this morning his blood sugar was better at 130, but by lunchtime,  it was over 250-300.  I suggest he talk to the [facility] about some  sliding-scale insulin in the morning.  He does take his Lantus at night.   I will plan on seeing him back again in [facility] in [facility] or  here, whichever is most convenient.     Thomas C. Daleen Squibb, MD, [facility]  Electronically Signed    TCW/MedQ  DD: 07/28/2008  DT: 07/29/2008  Job #: [id number]   cc:   [facility].

## 2010-12-07 NOTE — Discharge Summary (Signed)
   NAME:  Levi Meyers, Levi Meyers NO.:  [id number]   MEDICAL RECORD NO.:  [id number]                   PATIENT TYPE:  INP   LOCATION:  A313                                 FACILITY:  [facility]   PHYSICIAN:  Kirk Ruths, M.D.            DATE OF BIRTH:  1966/03/10   DATE OF ADMISSION:  08/29/2002  DATE OF DISCHARGE:  08/31/2002                                 DISCHARGE SUMMARY   DISCHARGE DIAGNOSES:  1. Intractable nausea and vomiting secondary to renal colic.  2. Recurrent ureteral stone.  3. Simple cyst of the right kidney.   HOSPITAL COURSE:  This 45 year old white male with a previous history of  ureteral stones was admitted to the emergency room with protracted nausea  and vomiting, severe sudden onset of pain.  He was treated with iron  medications without significant relief so he was admitted for oral IV  antiemetics and pain medications.  The patient's initial white count was  19,000.  This returned to normal range on repeat the following day.  The  patient also initially had an elevated blood sugar of 208.  This was while  he had D5 running in his IV fluids.  After that was corrected sugar returned  to normal.  The patient was admitted to floor, on IV antibiotics, IV pain  medications, IV antiemetics.  Was seen on consultation by Dennie Maizes,  M.D. for urology.  The patient's complaint nausea and vomiting subsided.  He  underwent an ultrasound of his kidneys which showed a simple cyst in the  right kidney.  The patient had been 24 hours without nausea, vomiting, or  pain.  The kidney stone had passed and he was discharged home on previous  medications to follow up with Dennie Maizes, M.D. in the office.  The  patient is stable at discharge.                                               Kirk Ruths, M.D.    WMM/MEDQ  D:  09/09/2002  T:  09/09/2002  Job:  [id number]

## 2010-12-07 NOTE — Discharge Summary (Signed)
NAME:  Levi Meyers, Levi Meyers NO.:  [id number]   MEDICAL RECORD NO.:  [id number]                   PATIENT TYPE:  INP   LOCATION:  A313                                 FACILITY:  [facility]   PHYSICIAN:  Kirk Ruths, M.D.            DATE OF BIRTH:  [birth year]   DATE OF ADMISSION:  08/29/2002  DATE OF DISCHARGE:  08/31/2002                                 DISCHARGE SUMMARY   DISCHARGE DIAGNOSIS:  Ureteral calculus.   HISTORY OF PRESENT ILLNESS:  This 45 year old white male has a history of  multiple ureteral stones.  He has undergone lithotripsy in the past.  He  presented to the emergency room two days before admission with symptoms of  renal colic.  A CT at the time confirmed diagnosis.  The patient was  discharged on p.o. medications.  On the day of admission, he had sudden  onset of severe pain with nausea and vomiting and was admitted through the  emergency room by the hospitalist with a complaint of nausea and vomiting  with ureteral stones.  The patient's CT showed a tiny 1 mm left renal  calculus without hydronephrosis.  There was a 1 cm exophytic lesion  associated with the lower pole of the right kidney.  The patient's symptoms  were only left.   HOSPITAL COURSE:  After being admitted to the floor, the patient was treated  with antiemetics and pain medications per IV.  The patient was somewhat  improved the following day.  He was seen in consultation by Dennie Maizes,  M.D.  The patient underwent ultrasound to evaluate the exophytic lesion on  the kidney.  This was found to be a simple cyst of the right kidney  __________  nephrosis.  The patient's acute abdominal series showed positive  gas.  The patient's labs showed a white count of 19,000 on admission.  This  was improved to 8800 the following day.  The patient's metabolic panel  showed an initial glucose of 208 right D5 was taken out of his IV fluids.  This was 120 the following day  fasting.  The patient's amylase was initially  146, but this was back to normal on the following day at 68.  There is no  urinalysis on the record.  At the time of discharge, the patient is without  pain or nausea and tolerating a regular diet.  It is felt that he has passed  the stone.   DISPOSITION:  He will be discharged home to be followed in two weeks by  Dennie Maizes, M.D.  The patient is stable condition.                                               Kirk Ruths, M.D.  WMM/MEDQ  D:  08/31/2002  T:  08/31/2002  Job:  [id number]

## 2010-12-07 NOTE — Op Note (Signed)
NAME:  Levi Meyers, Levi Meyers NO.:  [id number]   MEDICAL RECORD NO.:  [id number]                   PATIENT TYPE:  AMB   LOCATION:  NESC                                 FACILITY:  [facility]   PHYSICIAN:  Bertram Millard. Dahlstedt, M.D.          DATE OF BIRTH:  1966/03/27   DATE OF PROCEDURE:  11/17/2002  DATE OF DISCHARGE:                                 OPERATIVE REPORT   PREOPERATIVE DIAGNOSIS:  Left flank pain, hematuria.   POSTOPERATIVE DIAGNOSIS:  Left flank pain, hematuria.  No evidence of  ureteral obstruction or stone.   OPERATION PERFORMED:  Cystoscopy, bilateral retrograde ureteral pyelograms,  left ureteroscopy.   SURGEON:  Bertram Millard. Dahlstedt, M.D.\   ANESTHESIA:  General with LMA.   COMPLICATIONS:  None.   INDICATIONS FOR PROCEDURE:  The patient is a 45 year old male with recurrent  left flank pain and microscopic hematuria.  By the patient's own report, he  has had several kidney stones.  He does report that he has had two of these  extracted by Dennie Maizes, M.D. in [place].  He has been followed for  the past week or so.  He has had persistent left flank pain which has become  worse.  He had a CT urogram recently.  I failed to see any significant  evidence of a stone although there was one small calcification near the  bladder which may have been in the distal ureter.  As the patient has had  persistent pain, he has been pressing me to perform a procedure to relieve  him of his pain.  As he may have a stone, I recommended cystoscopy under  anesthesia, retrograde pyelogram and possible ureteroscopic stone  extraction.  He is aware of risks and complications of the procedure as well  as the fact that I may not find a stone.  He desires to proceed.   DESCRIPTION OF PROCEDURE:  The patient was administered a general anesthetic  after IV antibiotics were administered.  He was placed in the dorsal  lithotomy position.  The genitalia and  perineum were prepped and draped.  A  22 French panendoscope was passed through his urethra which was normal.  The  prostate was not obstructed.  The bladder was entered and inspected  circumferentially.  No tumors, no foreign bodies or trabeculation.  Bilateral retrograde pyelograms were performed.  I did not see  hydronephrosis.  There were no filling defects on the right.  On the left,  there was a small filling defect near the bladder which may have actually  been an air bubble but did not move.  There was no hydronephrosis in the  renal pelves and the caliceal systems looked normal bilaterally.  I then  performed a diagnostic left ureteroscopy up to the patient's renal pelvis  with a 6 French short ureteroscope.  I did not see any evidence of stones.  There was a  fair amount of mucus in the left ureter, however.  At this point  I terminated the procedure.  The bladder was drained.  A B&O suppository was  placed.   The patient tolerated the procedure well.  He was awakened and taken to the  post anesthesia care unit in stable condition.                                               Bertram Millard. Dahlstedt, M.D.   SMD/MEDQ  D:  11/17/2002  T:  11/17/2002  Job:  [id number]

## 2010-12-07 NOTE — H&P (Signed)
NAME:  Levi Meyers, Levi Meyers NO.:  [id number]   MEDICAL RECORD NO.:  [id number]                   PATIENT TYPE:  INP   LOCATION:  A313                                 FACILITY:  [facility]   PHYSICIAN:  Hanley Hays. Dechurch, M.D.           DATE OF BIRTH:  05-15-1966   DATE OF ADMISSION:  08/29/2002  DATE OF DISCHARGE:                                HISTORY & PHYSICAL   HISTORY OF PRESENT ILLNESS:  The patient is a 45 year old healthy white male  followed by [facility] with a past medical history of  nephrolithiasis requiring lithotripsy multiple times in the past.  He was  seen in the emergency room on the evening of August 27, 2002, with renal  colic.  A CT at that time revealed a 1 mm stone at the left pole.  It also  revealed an unidentified abnormality at the pole of the right kidney on this  noncontrast CT, and a follow-up contrast CT versus ultrasound was  recommended.  The patient had sudden onset of nausea and vomiting this  evening and represented to the emergency room.  He has ongoing left lower  flank pain.  He denies any fever or chills.  He has received 75 mg of  Phenergan and some Toradol in the emergency room, and he remains nauseous  when he moves around and quite lethargic.  He denies any headache.  There  has been no family history of nausea or vomiting illnesses.  His mental  status has been normal.  He is complaining of some heartburn now.  No  diarrhea present.  No GU complaints.  Left lower flank pain which appears to  be mild to moderate.  The review of systems is otherwise negative.   PAST MEDICAL HISTORY:  1. Cholecystectomy.  2. Appendectomy.  3. Right leg surgery, lower leg surgery.  4. Hernia repair.  5. Left knee surgery x 2.   MEDICATIONS:  Vicodin only, which was given last night.   ALLERGIES:  CODEINE causes nausea.   FAMILY MEDICAL HISTORY:  Not available.   SOCIAL HISTORY:  No alcohol or tobacco abuse.   He is employed.  He is  married.  He has a English as a second language teacher.   PHYSICAL EXAMINATION:  VITAL SIGNS:  Temperature 96, 130/70, pulse 84,  respirations are unlabored.  GENERAL:  Lethargic.  Complaining of nausea.  NECK:  No adenopathy.  HEENT:  Mucous membranes are moist.  LUNGS:  Clear to auscultation anteriorly and posteriorly.  HEART:  Regular.  Without murmur, gallop, or rub.  ABDOMEN:  Protuberant.  Nontender to palpation.  Positive bowel sounds are  noted.  EXTREMITIES:  Without clubbing, cyanosis, or edema.  SKIN:  Without rash, lesion, or breakdown.  NEUROLOGIC:  He arouses and answers briefly but then falls off to sleep.  He  moves all extremities x 4.  He can follow some commands though he  is slow.   LABORATORY DATA:  A UA thus far is unremarkable.  Unfortunately, no  laboratories were drawn in the emergency room either today or yesterday.   ASSESSMENT AND PLAN:  1. Intractable nausea and vomiting:  The patient will continue with IV     fluids and antiemetics with a trial of Zofran to see if that gives him     better relief.  Empiric Pepcid, given his complaint of heartburn, may     give him some symptomatic relief.  Will follow up laboratories once     available, and this will be deferred to the primary M.D.  2. Nephrolithiasis without hydronephrosis:  He was given Vicodin which may     or may not be involved in #1, but I suspect his nausea and vomiting is a     new unrelated problem.  He did not appear to be in significant pain at     this time though he notes some discomfort in the left flank.  3. Right soft tissue abnormality at the pole of the kidney:  Needs contrast     CT with thin slices or ultrasound to further define.                                               Hanley Hays Josefine Class, M.D.    FED/MEDQ  D:  08/29/2002  T:  08/29/2002  Job:  [id number]

## 2010-12-07 NOTE — Discharge Summary (Signed)
NAME:  Levi Meyers, Levi Meyers NO.:  [id number]   MEDICAL RECORD NO.:  [id number]          PATIENT TYPE:  INP   LOCATION:  1444                         FACILITY:  [facility]   PHYSICIAN:  Lonia Blood, M.D.       DATE OF BIRTH:  1966-02-07   DATE OF ADMISSION:  05/06/2008  DATE OF DISCHARGE:  05/10/2008                               DISCHARGE SUMMARY   PRIMARY CARE PHYSICIAN:  This patient goes to the [facility]  Department of [employer].   DISCHARGE DIAGNOSES:  1. Severe hypoglycemia of unclear cause - resolved.  2. Diabetes mellitus type 2.  3. Hypokalemia, resolved.  4. Hypertension.  5. Hyperlipidemia.  6. Coronary artery disease.  7. Pneumonia.   DISCHARGE MEDICATIONS:  1. Lantus 5 units at bedtime.  2. Doxycycline 100 mg twice a day.  3. Lisinopril 5 mg daily.  4. Aspirin 81 mg daily.  5. Lipitor 10 mg daily   CONDITION ON DISCHARGE:  [name] is discharged in good condition.  He  is instructed follow up with Dr. Romero Belling, an endocrinologist with  [facility] for further treatment and decisions about adjustment of insulin  dosage.   PROCEDURES THIS ADMISSION:  No procedures done.   CONSULTATIONS THIS ADMISSION:  No consultations obtained.   HISTORY AND PHYSICAL:  Refer to dictated H and P done by Dr. Flonnie Overman on  May 06, 2008.   HOSPITAL COURSE:  1. [name] was admitted from the emergency room with complaints of      altered mental status and persistent hypoglycemia.  The patient's      Lantus was discontinued and the patient was treated with      intravenous dextrose.  By hospital day #2, we attempted to      discontinue the infusion of intravenous dextrose, but the patient      became promptly hypoglycemic.  We resumed dextrose infusion and 48      hours into this admission, the patient again became hypoglycemic.      At that point in time, we started workup with a cosyntropin      stimulation test, C-peptide level measuring and we cultured  the      patient's blood and looked for hepatic failure.  Workup was      unrevealing.  At the time of discharge the patient was having      minimal hyperglycemia for which we have prescribed 5 units of      Lantus.  We have instructed the patient to closely monitor his CBGs      and to follow up with Dr. Romero Belling for further adjustments of      his diabetes treatment.  2. Mild pneumonia.  The patient was treated with intravenous      antibiotics throughout this admission. He was switched to oral      antibiotics at the time of discharge.  He is discharged afebrile      and with normal white blood cell count.      Lonia Blood, M.D.  Electronically Signed  SL/MEDQ  D:  06/02/2008  T:  06/03/2008  Job:  [id number]

## 2010-12-07 NOTE — Discharge Summary (Signed)
NAME:  Levi Meyers, SATZ NO.:  [id number]   MEDICAL RECORD NO.:  [id number]          PATIENT TYPE:  IPS   LOCATION:  0401                          [facility]ACILITY[facility]   PHYSICIAN:  Jasmine Pang, M.D. DATE OF BIRTH:  May 11, 1966   DATE OF ADMISSION:  01/03/2009  DATE OF DISCHARGE:  01/09/2009                               DISCHARGE SUMMARY   IDENTIFICATION:  This is a 45 year old married white male, who was  admitted on a voluntary basis.  He is from [facility].   HISTORY OF PRESENT ILLNESS:  The patient had suicidal ideation.  He had  an intentional overdose on his insulin.  He states he is upset about  male problems.  Stress and OD on insulin.  He does not have a  license due to severe illness (diabetes).  He was admitted first to  [facility].  He is currently on Celexa 20 mg daily.  He has  custody of 3 children of his sister-in-law and has 1 adopted daughter.  He reports a lot of stress taking care of 3 children of his sister-in-  law.  For further admission information, see psychiatric admission  assessment.   PHYSICAL FINDINGS:  There were no acute physical or medical problems  noted.  The patient's physical exam was done at the [facility]  ED.   HOSPITAL COURSE:  Upon admission, the patient was continued on aspirin  81 mg daily, lisinopril 10 mg b.i.d., Niaspan 500 mg p.o. t.i.d.,  simvastatin 40 mg daily, metoprolol 25 mg b.i.d. and citalopram 20 mg  daily.  He was also placed on Ambien 5 mg 1-2 pills at h.s. if needed  for sleep.  He was placed on Lantus 5 units at h.s. and Seroquel 50 mg  p.o. q.h.s.  In individual sessions with me, the patient was initially  depressed and anxious.  Affect was consistent with mood.  He had normal  psychomotor activity and speech was normal rate and flow.  He continued  to have difficulty sleeping.  He talked about having DSS involvement due  to his overdose in the home since the children were  there.  He was told  he cannot return to the home when he leaves and has to live with a  family member until DSS advices he can come home.  He discussed his  outbursts at home and stated he wanted to control these although he  denied any physical violence.  On January 04, 2009, his Seroquel was  increased to 200 mg p.o. q.h.s.  On January 05, 2009, he was still looking  for a place to go when discharged.  Family session with his wife went  well.  She felt she was very supportive.  She was concerned about his  mood instability and anger outbursts.  His mood swings and his anger  outbursts.  She was relieved to hear that we were treating this.  On  January 05, 2009, Depakote 500 mg p.o. q.6 p.m. was begun.  On January 06, 2009, the patient was less depressed, less anxious.  His thoughts were  logical and goal-directed.  He began to focus on wanting to go home.  He  was still looking for a place to stay when he left; however, he  eventually decided to live with his parents upon discharge and they were  okay with this.  His mood continued to improve.  On January 09, 2009, sleep  was good, appetite was good, mood was less depressed and less anxious.  Affect was consistent with mood.  There was no suicidal or homicidal  ideation.  No thoughts of self injurious behavior.  No auditory or  visual hallucinations.  No paranoia or delusions.  Thoughts were logical  and goal-directed.  Thought content no predominant theme.  Cognitive was  grossly back to baseline.  Insight fair.  Judgment fair.  Impulse  control good.  The patient wanted to go home today and was felt to be  safe for discharge.   DISCHARGE DIAGNOSES:  Axis I:  Mood disorder not otherwise specified.  Axis II:  None.  Axis III:  Insulin-dependent diabetes mellitus.  Axis IV:  Severe (Problems with primary support group, occupational  problem, medical problems, burden of psychiatric illness, department of  social service involvement.  Axis V:   Global assessment of functioning was 50.  Upon discharge, GAF  was 35.  Upon admission, GAF highest past year was 65.   DISCHARGE PLANS:  There were no specific activity level or dietary  restrictions.   POSTHOSPITAL CARE PLANS:  The patient will go to [facility] on January 10, 2009, at 8:00 a.m.   DISCHARGE MEDICATIONS:  1. Seroquel 200 mg at bedtime.  2. Depakote 500 mg at 6:00 p.m.  3. Lantus insulin 5 units at bedtime.  4. Aspirin 81 mg daily.  5. Lisinopril 100 mg daily.  6. Metoprolol 25 mg twice a day.  7. Niaspan 500 mg 3 times a day.  8. Celexa 20 mg daily.  9. Simvastatin 40 mg daily.   He is also to follow up with his family physician.  His primary care  physician is Dr. Phillips Meyers, on Monday January 16, 2009 for his medical  problems.      Jasmine Pang, M.D.  Electronically Signed     BHS/MEDQ  D:  01/29/2009  T:  01/30/2009  Job:  [id number]

## 2010-12-07 NOTE — Consult Note (Signed)
NAME:  Levi Meyers, Levi Meyers NO.:  [id number]   MEDICAL RECORD NO.:  [id number]                   PATIENT TYPE:  INP   LOCATION:  A313                                 FACILITY:  [facility]   PHYSICIAN:  Dennie Maizes, M.D.                DATE OF BIRTH:  [birth year]   DATE OF CONSULTATION:  DATE OF DISCHARGE:                                   CONSULTATION   REASON FOR CONSULTATION:  Severe left flank pain, nausea and vomiting.   HISTORY OF PRESENT ILLNESS:  This 45 year old male has a past history of  recurrent urolithiasis.  He has been known to me from prior evaluations.  He  has undergone urethroscopy stone extractions several years ago.  He came to  the emergency room on 08/27/02 with severe left flank pain radiating to the  front.  A non-contrast CT scan of the abdomen and pelvis was done.  This  revealed a 1 mm size left renal calculous without obstruction.  There was no  perinephric stranding or hydronephrosis.  The patient also had a 1 cm size  exophytic lesion from the right kidney.  He was sent home with p.o.  analgesics.  He developed intractable nausea and vomiting as well as  persistent pain and he returned to the emergency room.  He has been admitted  to the hospital for further evaluation.  There is no history of fever,  chills, swallowing difficulty, or gross hematuria.   PAST MEDICAL HISTORY:  1. Recurrent urolithiasis.  2. Status post cholecystectomy.  3. Status post appendectomy.  4. Status post left inguinal hernia repair.  5. Status post left knee surgery x2.   CURRENT MEDICATIONS:  None.   ALLERGIES:  Codeine which causes nausea.   PHYSICAL EXAMINATION:  GENERAL: The patient is comfortable at the time of  examination.  ABDOMEN: Soft.  No palpable flank masses, CVA tenderness.  No suprapubic  tenderness.  GENITALIA: Penis and testes are normal.   LABORATORY DATA:  Urinalysis is clear.  WBC is elevated to 19.5, hemoglobin  15.0,  hematocrit 43.7.  BUN 18, creatinine 1.0.   IMPRESSION:  1. Left renal colic.  2. Small left renal calculus.  3. Right renal mass.   RECOMMENDATIONS:  1. Right renal mass.  Schedule renal ultrasound for further evaluation.  2.     Left renal colic.  The patient has a normal urinalysis and 1 mm size non-     obstructing left renal calculous.  It is hard to account for the     patient's pain from these findings.   Thanks for this consult.  We will follow the patient with you.  Dennie Maizes, M.D.    SK/MEDQ  D:  08/29/2002  T:  08/29/2002  Job:  [id number]   cc:   Kirk Ruths, M.D.  [address]  [place]  [place] [address]  Fax: [phone]

## 2010-12-14 ENCOUNTER — Emergency Department (HOSPITAL_COMMUNITY)
Admission: EM | Admit: 2010-12-14 | Discharge: 2010-12-14 | Disposition: A | Discharge status: Home / Self Care | Payer: Medicare Other | Attending: Emergency Medicine | Admitting: Emergency Medicine

## 2010-12-14 DIAGNOSIS — Z794 Long term (current) use of insulin: Secondary | ICD-10-CM | POA: Insufficient documentation

## 2010-12-14 DIAGNOSIS — IMO0001 Reserved for inherently not codable concepts without codable children: Secondary | ICD-10-CM | POA: Insufficient documentation

## 2010-12-14 DIAGNOSIS — E7800 Pure hypercholesterolemia, unspecified: Secondary | ICD-10-CM | POA: Insufficient documentation

## 2010-12-14 DIAGNOSIS — E119 Type 2 diabetes mellitus without complications: Secondary | ICD-10-CM | POA: Insufficient documentation

## 2010-12-14 DIAGNOSIS — R05 Cough: Secondary | ICD-10-CM | POA: Insufficient documentation

## 2010-12-14 DIAGNOSIS — R6889 Other general symptoms and signs: Secondary | ICD-10-CM | POA: Insufficient documentation

## 2010-12-14 DIAGNOSIS — R059 Cough, unspecified: Secondary | ICD-10-CM | POA: Insufficient documentation

## 2010-12-14 DIAGNOSIS — J3489 Other specified disorders of nose and nasal sinuses: Secondary | ICD-10-CM | POA: Insufficient documentation

## 2010-12-14 DIAGNOSIS — I10 Essential (primary) hypertension: Secondary | ICD-10-CM | POA: Insufficient documentation

## 2010-12-14 DIAGNOSIS — J069 Acute upper respiratory infection, unspecified: Secondary | ICD-10-CM | POA: Insufficient documentation

## 2010-12-14 DIAGNOSIS — J309 Allergic rhinitis, unspecified: Secondary | ICD-10-CM | POA: Insufficient documentation

## 2011-04-08 ENCOUNTER — Ambulatory Visit (HOSPITAL_COMMUNITY)
Admission: RE | Admit: 2011-04-08 | Discharge: 2011-04-08 | Disposition: A | Discharge status: Home / Self Care | Payer: Medicare Other | Source: Home / Self Care | Attending: Psychiatry | Admitting: Psychiatry

## 2011-04-08 ENCOUNTER — Emergency Department (HOSPITAL_COMMUNITY)
Admission: EM | Admit: 2011-04-08 | Discharge: 2011-04-09 | Disposition: A | Discharge status: Other Acute Inpatient Hospital | Payer: Medicare Other | Source: Home / Self Care | Attending: Emergency Medicine | Admitting: Emergency Medicine

## 2011-04-08 DIAGNOSIS — E119 Type 2 diabetes mellitus without complications: Secondary | ICD-10-CM | POA: Insufficient documentation

## 2011-04-08 DIAGNOSIS — F332 Major depressive disorder, recurrent severe without psychotic features: Secondary | ICD-10-CM | POA: Insufficient documentation

## 2011-04-08 DIAGNOSIS — Z794 Long term (current) use of insulin: Secondary | ICD-10-CM | POA: Insufficient documentation

## 2011-04-08 DIAGNOSIS — F489 Nonpsychotic mental disorder, unspecified: Secondary | ICD-10-CM | POA: Insufficient documentation

## 2011-04-08 DIAGNOSIS — F3289 Other specified depressive episodes: Secondary | ICD-10-CM | POA: Insufficient documentation

## 2011-04-08 DIAGNOSIS — I10 Essential (primary) hypertension: Secondary | ICD-10-CM | POA: Insufficient documentation

## 2011-04-08 DIAGNOSIS — F329 Major depressive disorder, single episode, unspecified: Secondary | ICD-10-CM | POA: Insufficient documentation

## 2011-04-08 LAB — GLUCOSE, CAPILLARY: Glucose-Capillary: 163 mg/dL — ABNORMAL HIGH (ref 70–99)

## 2011-04-08 LAB — DIFFERENTIAL
Basophils Absolute: 0 10*3/uL (ref 0.0–0.1)
Lymphocytes Relative: 34 % (ref 12–46)
Lymphs Abs: 4.1 10*3/uL — ABNORMAL HIGH (ref 0.7–4.0)
Neutro Abs: 7.1 10*3/uL (ref 1.7–7.7)
Neutrophils Relative %: 58 % (ref 43–77)

## 2011-04-08 LAB — CBC
HCT: 43.9 % (ref 39.0–52.0)
Hemoglobin: 14.8 g/dL (ref 13.0–17.0)
MCV: 88.3 fL (ref 78.0–100.0)
RBC: 4.97 MIL/uL (ref 4.22–5.81)
RDW: 13.1 % (ref 11.5–15.5)
WBC: 12.2 10*3/uL — ABNORMAL HIGH (ref 4.0–10.5)

## 2011-04-09 ENCOUNTER — Inpatient Hospital Stay (HOSPITAL_COMMUNITY)
Admission: AD | Admit: 2011-04-09 | Discharge: 2011-04-12 | DRG: 885 | Disposition: A | Discharge status: Home / Self Care | Payer: Medicare Other | Source: Ambulatory Visit | Attending: Psychiatry | Admitting: Psychiatry

## 2011-04-09 DIAGNOSIS — T383X1A Poisoning by insulin and oral hypoglycemic [antidiabetic] drugs, accidental (unintentional), initial encounter: Secondary | ICD-10-CM

## 2011-04-09 DIAGNOSIS — E119 Type 2 diabetes mellitus without complications: Secondary | ICD-10-CM

## 2011-04-09 DIAGNOSIS — F3160 Bipolar disorder, current episode mixed, unspecified: Principal | ICD-10-CM

## 2011-04-09 DIAGNOSIS — T50992A Poisoning by other drugs, medicaments and biological substances, intentional self-harm, initial encounter: Secondary | ICD-10-CM

## 2011-04-09 DIAGNOSIS — I10 Essential (primary) hypertension: Secondary | ICD-10-CM

## 2011-04-09 DIAGNOSIS — E7800 Pure hypercholesterolemia, unspecified: Secondary | ICD-10-CM

## 2011-04-09 DIAGNOSIS — Z794 Long term (current) use of insulin: Secondary | ICD-10-CM

## 2011-04-09 LAB — GLUCOSE, CAPILLARY
Glucose-Capillary: 101 mg/dL — ABNORMAL HIGH (ref 70–99)
Glucose-Capillary: 166 mg/dL — ABNORMAL HIGH (ref 70–99)
Glucose-Capillary: 197 mg/dL — ABNORMAL HIGH (ref 70–99)

## 2011-04-09 LAB — COMPREHENSIVE METABOLIC PANEL
ALT: 18 U/L (ref 0–53)
Alkaline Phosphatase: 81 U/L (ref 39–117)
CO2: 26 mEq/L (ref 19–32)
Chloride: 101 mEq/L (ref 96–112)
GFR calc Af Amer: >60 mL/min (ref >60)
Glucose, Bld: 114 mg/dL — ABNORMAL HIGH (ref 70–99)
Potassium: 3.6 mEq/L (ref 3.5–5.1)
Sodium: 138 mEq/L (ref 135–145)
Total Bilirubin: 0.2 mg/dL — ABNORMAL LOW (ref 0.3–1.2)
Total Protein: 7.4 g/dL (ref 6.0–8.3)

## 2011-04-09 LAB — RAPID URINE DRUG SCREEN, HOSP PERFORMED
Barbiturates: NOT DETECTED
Benzodiazepines: NOT DETECTED

## 2011-04-09 LAB — ACETAMINOPHEN LEVEL: Acetaminophen (Tylenol), Serum: <15 ug/mL (ref 10–30)

## 2011-04-10 DIAGNOSIS — F411 Generalized anxiety disorder: Secondary | ICD-10-CM

## 2011-04-10 DIAGNOSIS — F339 Major depressive disorder, recurrent, unspecified: Secondary | ICD-10-CM

## 2011-04-10 LAB — URINALYSIS, ROUTINE W REFLEX MICROSCOPIC
Bilirubin Urine: NEGATIVE
Specific Gravity, Urine: >1.03 — ABNORMAL HIGH
pH: 5.5

## 2011-04-10 LAB — URINE MICROSCOPIC-ADD ON

## 2011-04-10 LAB — GLUCOSE, CAPILLARY
Glucose-Capillary: 128 mg/dL — ABNORMAL HIGH (ref 70–99)
Glucose-Capillary: 251 mg/dL — ABNORMAL HIGH (ref 70–99)
Glucose-Capillary: 237 mg/dL — ABNORMAL HIGH (ref 70–99)

## 2011-04-10 LAB — VALPROIC ACID LEVEL: Valproic Acid Lvl: 17.9 ug/mL — ABNORMAL LOW (ref 50.0–100.0)

## 2011-04-11 LAB — GLUCOSE, CAPILLARY
Glucose-Capillary: 180 mg/dL — ABNORMAL HIGH (ref 70–99)
Glucose-Capillary: 261 mg/dL — ABNORMAL HIGH (ref 70–99)

## 2011-04-12 LAB — GLUCOSE, CAPILLARY
Glucose-Capillary: 157 mg/dL — ABNORMAL HIGH (ref 70–99)
Glucose-Capillary: 239 mg/dL — ABNORMAL HIGH (ref 70–99)

## 2011-04-15 NOTE — Assessment & Plan Note (Signed)
NAMEMarland Meyers  KAION, TISDALE NO.:  [id number]  MEDICAL RECORD NO.:  [id number]  LOCATION:  0506                          [facility]ACILITY[facility]  PHYSICIAN:  Franchot Gallo, MD     DATE OF BIRTH:  08-21-1965  DATE OF ADMISSION:  04/09/2011 DATE OF DISCHARGE:                      PSYCHIATRIC ADMISSION ASSESSMENT   This is a voluntary admission to the services of Dr. Harvie Heck [place].  This is a 45 year old single white male.  After arguing with his wife, he reported that he injected himself with 800 units fast-acting NovoLog. His wife apparently brought him over here to [facility].  He was sent to [facility] for medical clearance.  He told them "I tried to kill myself."  He reported taking 800 units of NovoLog at 12 noon.  This was around 9:30 on the 17th.  His initial glucose was 163.  He had no other abnormalities of his BMET.  His hemoglobin showed his white blood cell count to be slightly elevated at 12.2, otherwise it was unremarkable.  He never lost consciousness.  He stated that he had attempted suicide about 3 years ago and went to [facility] at that time.  He started to hear voices 2-3 days ago.  No words, just noise.  He states that he is still feeling suicidal and states that he might still overdose on his insulin.  PAST PSYCHIATRIC HISTORY:  He reports 2 prior admissions here and 1 at [facility].  He was here in June 2010 and at [facility] in July 2010 and April 2011 at [facility].  He is currently in outpatient care at [facility] with Dr. Raliegh Scarlet.  He states that about 2 weeks ago Wellbutrin was started.  SOCIAL HISTORY:  He went to the 10th grade.  He has been married once for 19 years.  He has a 41 year old daughter.  He has been receiving SSDI for the past 2 years for his diabetes.  FAMILY HISTORY:  His mother apparently had a stroke and MI at age 89.  ALCOHOL/DRUG HISTORY:  He denies.  PRIMARY CARE PROVIDER:  Dr. Renette Butters in  [place].  MEDICAL PROBLEMS:  Diabetes, hypertension, increased cholesterol.  MEDICATIONS:  We need to call [facility].  He believes that he gets NovoLog 20 mg according to a sliding scale t.i.d. a.c., Wellbutrin 100 mg, he has just been started on that recently.  He also states Lantus 120 units at bedtime, pravastatin 20 mg at bedtime, metoprolol 20 mg b.i.d., lisinopril 20 mg b.i.d., trazodone 100 mg at bedtime, and Depakote ER 1000 mg at bedtime.  DRUG ALLERGIES:  CODEINE.  He gets nausea and vomiting.  POSITIVE PHYSICAL FINDINGS:  He is an obese white male who has disconjugate gaze, probably lazy eye.  He appears to be low normal to borderline intelligence. VITAL SIGNS:  He is afebrile at 98.1 to 98.4.  His pulse was 73-83, respirations 16-18, and blood pressure ranged from 111/73 to 141/85.  His other pertinent labs have already been commented on.  MENTAL STATUS EXAM:  He is alert and oriented.  He is casually groomed and dressed in hospital scrubs.  He appears to be adequately nourished.  His speech has a normal rate, rhythm and tone.  His mood is appropriate to the situation.  His affect is normal range.  Judgment and insight are poor.  Concentration and memory are intact.  Intelligence is borderline normal to below normal.  He reports that he still suicidal.  He is not homicidal.  He states that he has been hearing mumbling noises for the past 2 weeks, but he did not mention this to Dr. Raliegh Scarlet.  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  AXIS II:  Deferred.  AXIS III:  Hypertension, diabetes, hypercholesteremia.  AXIS IV:  Upset after arguing with wife.  AXIS V: 45.  PLAN:  Admit for safety and stabilization.  His meds will be verified and adjusted as indicated.  He already has outpatient at least med management.  Will have to work on maybe getting him  some therapy. Estimated length of stay is 3-5 days.     Mickie Leonarda Salon, P.A.-C.   ______________________________ Franchot Gallo, MD    MD/MEDQ  D:  04/09/2011  T:  04/09/2011  Job:  [id number]  Electronically Signed by Jaci Lazier ADAMS P.A.-C. on 04/15/2011 05:49:41 PM Electronically Signed by Franchot Gallo MD on 04/15/2011 09:56:34 PM

## 2011-04-22 LAB — GLUCOSE, CAPILLARY
Glucose-Capillary: 117 — ABNORMAL HIGH
Glucose-Capillary: 118 — ABNORMAL HIGH
Glucose-Capillary: 125 — ABNORMAL HIGH
Glucose-Capillary: 142 — ABNORMAL HIGH
Glucose-Capillary: 153 — ABNORMAL HIGH
Glucose-Capillary: 157 — ABNORMAL HIGH
Glucose-Capillary: 160 — ABNORMAL HIGH
Glucose-Capillary: 160 — ABNORMAL HIGH
Glucose-Capillary: 161 — ABNORMAL HIGH
Glucose-Capillary: 182 — ABNORMAL HIGH
Glucose-Capillary: 206 — ABNORMAL HIGH
Glucose-Capillary: 207 — ABNORMAL HIGH
Glucose-Capillary: 214 — ABNORMAL HIGH
Glucose-Capillary: 215 — ABNORMAL HIGH
Glucose-Capillary: 220 — ABNORMAL HIGH
Glucose-Capillary: 226 — ABNORMAL HIGH
Glucose-Capillary: 26 — CL
Glucose-Capillary: 35 — CL
Glucose-Capillary: 53 — ABNORMAL LOW
Glucose-Capillary: 62 — ABNORMAL LOW
Glucose-Capillary: 70
Glucose-Capillary: 72
Glucose-Capillary: 85

## 2011-04-22 LAB — URINE CULTURE
Colony Count: NO GROWTH
Culture: NO GROWTH

## 2011-04-22 LAB — DIFFERENTIAL
Basophils Absolute: 0.1
Basophils Relative: 1
Eosinophils Absolute: 0
Monocytes Relative: 7
Neutro Abs: 10.7 — ABNORMAL HIGH
Neutrophils Relative %: 72

## 2011-04-22 LAB — BASIC METABOLIC PANEL
BUN: 10
CO2: 21
Calcium: 8.8
Chloride: 104
Creatinine, Ser: 0.75
Glucose, Bld: 261 — ABNORMAL HIGH

## 2011-04-22 LAB — URINALYSIS, ROUTINE W REFLEX MICROSCOPIC
Bilirubin Urine: NEGATIVE
Glucose, UA: 100 — AB
Hgb urine dipstick: NEGATIVE
Specific Gravity, Urine: 1.015
Urobilinogen, UA: 0.2

## 2011-04-22 LAB — RAPID URINE DRUG SCREEN, HOSP PERFORMED
Barbiturates: NOT DETECTED
Benzodiazepines: NOT DETECTED

## 2011-04-22 LAB — COMPREHENSIVE METABOLIC PANEL
Albumin: 2.9 — ABNORMAL LOW
BUN: 8
Chloride: 107
Creatinine, Ser: 1.07
Total Bilirubin: 0.5

## 2011-04-22 LAB — CARDIAC PANEL(CRET KIN+CKTOT+MB+TROPI)
CK, MB: 1.2
CK, MB: 1.4
Total CK: 56
Total CK: 62

## 2011-04-22 LAB — CBC
HCT: 39.6
MCHC: 34
MCHC: 34.1
MCV: 88.5
MCV: 89.4
Platelets: 253
RBC: 4.79
RDW: 13.4
RDW: 13.5

## 2011-04-22 LAB — CULTURE, BLOOD (ROUTINE X 2): Culture: NO GROWTH

## 2011-04-22 LAB — CK TOTAL AND CKMB (NOT AT ARMC)
Relative Index: INVALID
Total CK: 60

## 2011-04-22 LAB — CORTISOL
Cortisol, Plasma: 27.4
Cortisol, Plasma: 6.8

## 2011-04-22 LAB — HEMOGLOBIN A1C: Mean Plasma Glucose: 240

## 2011-04-22 LAB — PROTIME-INR: INR: 1

## 2011-04-22 LAB — MISCELLANEOUS TEST

## 2011-04-22 LAB — MAGNESIUM: Magnesium: 1.9

## 2011-04-22 LAB — TROPONIN I: Troponin I: 0.03

## 2011-04-25 NOTE — Discharge Summary (Signed)
  NAME:  Levi Meyers, LUSTY NO.:  [id number]  MEDICAL RECORD NO.:  [id number]  LOCATION:  0506                          [facility]ACILITY[facility]  [facility]  PHYSICIAN:  Franchot Gallo, MD     DATE OF BIRTH:  1966-03-06  DATE OF ADMISSION:  04/09/2011 DATE OF DISCHARGE:  04/12/2011                              DISCHARGE SUMMARY   REASON FOR ADMISSION:  This is a 45 year old male who was admitted after he injected himself with 800 units of fast acting NovoLog stating that he wanted to "kill himself."  The patient never lost consciousness.  Has a past history of a suicide attempt about three  years ago and was still endorsing suicidal thinking.  FINAL IMPRESSION:  AXIS I:  Bipolar disorder mixed. AXIS II:  Deferred. AXIS III:  History of hypertension, hypercholesteremia and diabetes. AXIS IV:  Relationship issues. AXIS V:  GAF on discharge is 65.  PERTINENT LABS:  Glucose was 163, hemoglobin 12.2.  SIGNIFICANT FINDINGS:  The patient was admitted to the adult milieu for safety and stabilization while medications were clarified.  The patient was attending the discharge planning groups and endorsing overdosing on his insulin after a disagreement with his wife.  He initially was having problems with sleep, reporting initial insomnia but reporting a good appetite.  Having severe depressive symptoms rating it a 9 on a of 1-10. We increased his trazodone for sleep and started Abilify to augment his antidepressant and monitor his blood sugars.  The patient was improving.  Counselor had worked with patient to improve coping skills.  On day of discharge, the patient denied any suicidal or homicidal thoughts. He was alert and oriented.  Recent and remote memory were intact and had improving insight.  DISCHARGE MEDICATIONS: 1. Abilify 5 mg at bedtime. 2. Aspirin 81 mg daily. 3. Wellbutrin 150 mg one tablet b.i.d. 4. Depakote 500 mg taking two at bedtime. 5. Trazodone 150 mg at bedtime. 6.  Lisinopril 10 mg daily. 7. Metoprolol 25 mg one b.i.d. 8. Pravachol 20 mg daily. 9. Lantus insulin 20 units subcu at bedtime and sliding scale as     directed.  The patient was to stop taking Wellbutrin SR 150 mg daily, Abilify 15 mg daily and Depakote ER 500 mg b.i.d.  He was also to stop his ibuprofen and Lantus 120 mg at bedtime.  His follow-up appointment was with [facility], phone number [phone],HQ Tuesday, September 25 at 8 a.m.     Landry Corporal, N.P.   ______________________________ Franchot Gallo, MD    JO/MEDQ  D:  04/22/2011  T:  04/22/2011  Job:  [id number]  Electronically Signed by Limmie PatriciaP. on 04/25/2011 09:24:18 AM Electronically Signed by Franchot Gallo MD on 04/25/2011 04:59:36 PM

## 2011-04-26 LAB — CBC
HCT: 46.9 % (ref 39.0–52.0)
Hemoglobin: 15.7 g/dL (ref 13.0–17.0)
MCHC: 33.5 g/dL (ref 30.0–36.0)
MCV: 89.6 fL (ref 78.0–100.0)
RBC: 5.23 MIL/uL (ref 4.22–5.81)
WBC: 15 10*3/uL — ABNORMAL HIGH (ref 4.0–10.5)

## 2011-04-26 LAB — BASIC METABOLIC PANEL
CO2: 23 mEq/L (ref 19–32)
CO2: 24 mEq/L (ref 19–32)
Chloride: 101 mEq/L (ref 96–112)
Chloride: 105 mEq/L (ref 96–112)
Creatinine, Ser: 1.05 mg/dL (ref 0.4–1.5)
GFR calc Af Amer: >60 mL/min (ref >60)
GFR calc Af Amer: >60 mL/min (ref >60)
Potassium: 3.3 mEq/L — ABNORMAL LOW (ref 3.5–5.1)
Sodium: 133 mEq/L — ABNORMAL LOW (ref 135–145)

## 2011-04-26 LAB — GLUCOSE, CAPILLARY
Glucose-Capillary: 144 mg/dL — ABNORMAL HIGH (ref 70–99)
Glucose-Capillary: 182 mg/dL — ABNORMAL HIGH (ref 70–99)
Glucose-Capillary: 191 mg/dL — ABNORMAL HIGH (ref 70–99)
Glucose-Capillary: 207 mg/dL — ABNORMAL HIGH (ref 70–99)
Glucose-Capillary: 208 mg/dL — ABNORMAL HIGH (ref 70–99)
Glucose-Capillary: 210 mg/dL — ABNORMAL HIGH (ref 70–99)
Glucose-Capillary: 228 mg/dL — ABNORMAL HIGH (ref 70–99)
Glucose-Capillary: 360 mg/dL — ABNORMAL HIGH (ref 70–99)

## 2011-04-26 LAB — CARDIAC PANEL(CRET KIN+CKTOT+MB+TROPI)
CK, MB: 1 ng/mL (ref 0.3–4.0)
Total CK: 140 U/L (ref 7–232)
Total CK: 66 U/L (ref 7–232)
Troponin I: 0.01 ng/mL (ref 0.00–0.06)

## 2011-04-26 LAB — TSH: TSH: 1.504 u[IU]/mL (ref 0.350–4.500)

## 2011-05-01 LAB — CBC
Platelets: 293
RDW: 13.5

## 2011-05-01 LAB — DIFFERENTIAL
Basophils Absolute: 0
Lymphocytes Relative: 9 — ABNORMAL LOW
Neutro Abs: 11.1 — ABNORMAL HIGH
Neutrophils Relative %: 85 — ABNORMAL HIGH

## 2011-05-01 LAB — BASIC METABOLIC PANEL
BUN: 15
Calcium: 9.1
GFR calc non Af Amer: >60
Glucose, Bld: 113 — ABNORMAL HIGH
Sodium: 141

## 2011-05-01 LAB — POCT CARDIAC MARKERS
CKMB, poc: <1 — ABNORMAL LOW
Operator id: 189501
Troponin i, poc: <0.05

## 2011-05-08 LAB — CBC
MCV: 87.7
Platelets: 260
WBC: 6.8

## 2011-05-08 LAB — URINALYSIS, ROUTINE W REFLEX MICROSCOPIC
Glucose, UA: >1000 — AB
Leukocytes, UA: NEGATIVE
Nitrite: NEGATIVE
Protein, ur: NEGATIVE
Urobilinogen, UA: 0.2

## 2011-05-08 LAB — BASIC METABOLIC PANEL
BUN: 17
Chloride: 103
Creatinine, Ser: 1.11
GFR calc non Af Amer: >60

## 2011-05-08 LAB — DIFFERENTIAL
Basophils Absolute: 0
Eosinophils Absolute: 0.1
Lymphs Abs: 2.2
Neutrophils Relative %: 57

## 2011-05-08 LAB — URINE MICROSCOPIC-ADD ON

## 2011-07-22 ENCOUNTER — Encounter: Payer: Self-pay | Admitting: Cardiology

## 2011-09-18 ENCOUNTER — Emergency Department (HOSPITAL_COMMUNITY)
Admission: EM | Admit: 2011-09-18 | Discharge: 2011-09-18 | Disposition: A | Discharge status: Home / Self Care | Payer: Medicare Other | Attending: Emergency Medicine | Admitting: Emergency Medicine

## 2011-09-18 ENCOUNTER — Emergency Department (HOSPITAL_COMMUNITY): Payer: Medicare Other

## 2011-09-18 ENCOUNTER — Other Ambulatory Visit: Payer: Self-pay

## 2011-09-18 ENCOUNTER — Encounter (HOSPITAL_COMMUNITY): Payer: Self-pay | Admitting: *Deleted

## 2011-09-18 DIAGNOSIS — I498 Other specified cardiac arrhythmias: Secondary | ICD-10-CM | POA: Insufficient documentation

## 2011-09-18 DIAGNOSIS — Z7982 Long term (current) use of aspirin: Secondary | ICD-10-CM | POA: Insufficient documentation

## 2011-09-18 DIAGNOSIS — R0989 Other specified symptoms and signs involving the circulatory and respiratory systems: Secondary | ICD-10-CM | POA: Insufficient documentation

## 2011-09-18 DIAGNOSIS — E119 Type 2 diabetes mellitus without complications: Secondary | ICD-10-CM | POA: Insufficient documentation

## 2011-09-18 DIAGNOSIS — R0602 Shortness of breath: Secondary | ICD-10-CM | POA: Insufficient documentation

## 2011-09-18 DIAGNOSIS — E785 Hyperlipidemia, unspecified: Secondary | ICD-10-CM | POA: Insufficient documentation

## 2011-09-18 DIAGNOSIS — R079 Chest pain, unspecified: Secondary | ICD-10-CM | POA: Insufficient documentation

## 2011-09-18 DIAGNOSIS — I10 Essential (primary) hypertension: Secondary | ICD-10-CM | POA: Insufficient documentation

## 2011-09-18 DIAGNOSIS — Z9889 Other specified postprocedural states: Secondary | ICD-10-CM | POA: Insufficient documentation

## 2011-09-18 DIAGNOSIS — Z794 Long term (current) use of insulin: Secondary | ICD-10-CM | POA: Insufficient documentation

## 2011-09-18 DIAGNOSIS — E876 Hypokalemia: Secondary | ICD-10-CM

## 2011-09-18 DIAGNOSIS — K219 Gastro-esophageal reflux disease without esophagitis: Secondary | ICD-10-CM | POA: Insufficient documentation

## 2011-09-18 DIAGNOSIS — R0609 Other forms of dyspnea: Secondary | ICD-10-CM | POA: Insufficient documentation

## 2011-09-18 LAB — BASIC METABOLIC PANEL
BUN: 10 mg/dL (ref 6–23)
BUN: 11 mg/dL (ref 6–23)
CO2: 24 mEq/L (ref 19–32)
CO2: 21 mEq/L (ref 19–32)
Calcium: 9.8 mg/dL (ref 8.4–10.5)
Calcium: 9.1 mg/dL (ref 8.4–10.5)
Chloride: 106 mEq/L (ref 96–112)
Chloride: 107 mEq/L (ref 96–112)
Creatinine, Ser: 1.05 mg/dL (ref 0.50–1.35)
Creatinine, Ser: 0.91 mg/dL (ref 0.50–1.35)
GFR calc Af Amer: >90 mL/min (ref >90)
GFR calc Af Amer: >90 mL/min (ref >90)
GFR calc non Af Amer: 84 mL/min — ABNORMAL LOW (ref >90)
GFR calc non Af Amer: >90 mL/min (ref >90)
Glucose, Bld: 55 mg/dL — ABNORMAL LOW (ref 70–99)
Glucose, Bld: 135 mg/dL — ABNORMAL HIGH (ref 70–99)
Potassium: 2.7 mEq/L — CL (ref 3.5–5.1)
Potassium: 3.4 mEq/L — ABNORMAL LOW (ref 3.5–5.1)
Sodium: 142 mEq/L (ref 135–145)
Sodium: 138 mEq/L (ref 135–145)

## 2011-09-18 LAB — CBC
HCT: 43.7 % (ref 39.0–52.0)
Hemoglobin: 15.5 g/dL (ref 13.0–17.0)
MCH: 31.6 pg (ref 26.0–34.0)
MCHC: 35.5 g/dL (ref 30.0–36.0)
MCV: 89 fL (ref 78.0–100.0)
Platelets: 312 10*3/uL (ref 150–400)
RBC: 4.91 MIL/uL (ref 4.22–5.81)
RDW: 13.4 % (ref 11.5–15.5)
WBC: 16 10*3/uL — ABNORMAL HIGH (ref 4.0–10.5)

## 2011-09-18 LAB — TROPONIN I
Troponin I: <0.3 ng/mL (ref <0.30)
Troponin I: <0.3 ng/mL (ref <0.30)

## 2011-09-18 LAB — GLUCOSE, CAPILLARY
Glucose-Capillary: 64 mg/dL — ABNORMAL LOW (ref 70–99)
Glucose-Capillary: 130 mg/dL — ABNORMAL HIGH (ref 70–99)

## 2011-09-18 MED ORDER — SODIUM CHLORIDE 0.9 % IV BOLUS (SEPSIS)
1000.0000 mL | Freq: Once | INTRAVENOUS | Status: AC
  Administered 2011-09-18: 1000 mL via INTRAVENOUS

## 2011-09-18 MED ORDER — MORPHINE SULFATE 4 MG/ML IJ SOLN
6.0000 mg | Freq: Once | INTRAMUSCULAR | Status: AC
  Administered 2011-09-18: 6 mg via INTRAVENOUS
  Filled 2011-09-18: qty 2

## 2011-09-18 MED ORDER — MORPHINE SULFATE 4 MG/ML IJ SOLN
4.0000 mg | Freq: Once | INTRAMUSCULAR | Status: AC
  Administered 2011-09-18: 4 mg via INTRAVENOUS
  Filled 2011-09-18: qty 1

## 2011-09-18 MED ORDER — POTASSIUM CHLORIDE CRYS ER 20 MEQ PO TBCR
40.0000 meq | EXTENDED_RELEASE_TABLET | Freq: Once | ORAL | Status: AC
  Administered 2011-09-18: 40 meq via ORAL
  Filled 2011-09-18: qty 2

## 2011-09-18 MED ORDER — POTASSIUM CHLORIDE 10 MEQ/100ML IV SOLN
10.0000 meq | Freq: Once | INTRAVENOUS | Status: AC
  Administered 2011-09-18: 10 meq via INTRAVENOUS
  Filled 2011-09-18: qty 100

## 2011-09-18 MED ORDER — ONDANSETRON HCL 4 MG/2ML IJ SOLN
4.0000 mg | Freq: Once | INTRAMUSCULAR | Status: AC
  Administered 2011-09-18: 4 mg via INTRAVENOUS
  Filled 2011-09-18: qty 2

## 2011-09-18 MED ORDER — IOHEXOL 300 MG/ML  SOLN
100.0000 mL | Freq: Once | INTRAMUSCULAR | Status: AC | PRN
  Administered 2011-09-18: 100 mL via INTRAVENOUS

## 2011-09-18 MED ORDER — POTASSIUM CHLORIDE CRYS ER 20 MEQ PO TBCR
20.0000 meq | EXTENDED_RELEASE_TABLET | Freq: Once | ORAL | Status: AC
  Administered 2011-09-18: 20 meq via ORAL
  Filled 2011-09-18: qty 1

## 2011-09-18 NOTE — ED Notes (Addendum)
Per EMS- Patient was found at home hyperventilating with substernal chest pain. 12 Lead was unremarkable and patient coached to take slow er breaths. Patient is diabetic and CBG was 144. BP 113/74 HR 100 Resp. 48. EMS gave aspirin 324 mg and 1 sublingual nitro with slight improvement in pain. HX of cath her at [facility] but patient does not remember when. Allergic to codeine.

## 2011-09-18 NOTE — ED Notes (Signed)
Patient CBG is 64.

## 2011-09-18 NOTE — ED Notes (Signed)
Dr. Juleen China advised patient's blood glucose is 55 and critical potassium of 2.7. Per Dr. Juleen China give patient juice when he returns from CT.

## 2011-09-18 NOTE — ED Notes (Signed)
D/c instructions reviewed w/ pt and family - pt and family deny any further questions or concerns at present.\ 

## 2011-09-18 NOTE — ED Notes (Signed)
Patient given 8oz. Orange juice.

## 2011-09-18 NOTE — ED Notes (Signed)
Patient states he had just finished eating and he started to have chest pain and was SOB and nausea. Patient denies vomiting. Patient placed on monitor and 2L oxygen with sats of 94%. Family at bedside.

## 2011-09-18 NOTE — ED Notes (Signed)
Patient resting and remains on monitor with NAD at this time. Family at bedside.

## 2011-09-18 NOTE — Discharge Instructions (Signed)
Chest Pain (Nonspecific) It is often hard to give a specific diagnosis for the cause of chest pain. There is always a chance that your pain could be related to something serious, such as a heart attack or a blood clot in the lungs. You need to follow up with your caregiver for further evaluation. CAUSES   Heartburn.   Pneumonia or bronchitis.   Anxiety and stress.   Inflammation around your heart (pericarditis) or lung (pleuritis or pleurisy).   A blood clot in the lung.   A collapsed lung (pneumothorax). It can develop suddenly on its own (spontaneous pneumothorax) or from injury (trauma) to the chest.  The chest wall is composed of bones, muscles, and cartilage. Any of these can be the source of the pain.  The bones can be bruised by injury.   The muscles or cartilage can be strained by coughing or overwork.   The cartilage can be affected by inflammation and become sore (costochondritis).  DIAGNOSIS  Lab tests or other studies, such as X-rays, an EKG, stress testing, or cardiac imaging, may be needed to find the cause of your pain.  TREATMENT   Treatment depends on what may be causing your chest pain. Treatment may include:   Acid blockers for heartburn.   Anti-inflammatory medicine.   Pain medicine for inflammatory conditions.   Antibiotics if an infection is present.   You may be advised to change lifestyle habits. This includes stopping smoking and avoiding caffeine and chocolate.   You may be advised to keep your head raised (elevated) when sleeping. This reduces the chance of acid going backward from your stomach into your esophagus.   Most of the time, nonspecific chest pain will improve within 2 to 3 days with rest and mild pain medicine.  HOME CARE INSTRUCTIONS   If antibiotics were prescribed, take the full amount even if you start to feel better.   For the next few days, avoid physical activities that bring on chest pain. Continue physical activities as  directed.   Do not smoke cigarettes or drink alcohol until your symptoms are gone.   Only take over-the-counter or prescription medicine for pain, discomfort, or fever as directed by your caregiver.   Follow your caregiver's suggestions for further testing if your chest pain does not go away.   Keep any follow-up appointments you made. If you do not go to an appointment, you could develop lasting (chronic) problems with pain. If there is any problem keeping an appointment, you must call to reschedule.  SEEK MEDICAL CARE IF:   You think you are having problems from the medicine you are taking. Read your medicine instructions carefully.   Your chest pain does not go away, even after treatment.   You develop a rash with blisters on your chest.  SEEK IMMEDIATE MEDICAL CARE IF:   You have increased chest pain or pain that spreads to your arm, neck, jaw, back, or belly (abdomen).   You develop shortness of breath, an increasing cough, or you are coughing up blood.   You have severe back or abdominal pain, feel sick to your stomach (nauseous) or throw up (vomit).   You develop severe weakness, fainting, or chills.   You have an oral temperature above 102 F (38.9 C), not controlled by medicine.  THIS IS AN EMERGENCY. Do not wait to see if the pain will go away. Get medical help at once. Call your local emergency services (911 in [place] Do not drive yourself to   the hospital. MAKE SURE YOU:   Understand these instructions.   Will watch your condition.   Will get help right away if you are not doing well or get worse.  Document Released: 04/17/2005 Document Revised: 03/20/2011 Document Reviewed: 02/11/2008 Exit[employer] Patient Information 2012 [employer].  RESOURCE GUIDE  Dental Problems  Patients with Medicaid: [facility]                     [facility] [address]                                           [address] Phone:  [phone]                                                   Phone:  [phone]  If unable to pay or uninsured, contact:  [facility] or [facility] to become qualified for the adult dental clinic.  Chronic Pain Problems Contact [facility]  [phone] Patients need to be referred by their primary care doctor.  Insufficient Money for Medicine Contact United Way:  call "211" or [facility] 271-5999.  No Primary Care Doctor Call [facility]  [phone] Other agencies that provide inexpensive medical care    [facility]  [phone]    [facility]  [phone]    [facility]  [phone]    Women's Clinic  [phone]    [facility]  [phone]    [facility]  [phone]  Psychological Services [facility]  [phone] [facility]  [phone] [facility]   [phone] (emergency services [phone])  Substance Abuse Resources [facility]  336-882-2125 [facility] 336-784-9470 [facility] 336-285-9073 [facility] 336-845-3988 Residential & Outpatient Substance Abuse Program  800-659-3381  Abuse/Neglect [place] Child Abuse Hotline (336) 641-3795 [place] Child Abuse Hotline 800-378-5315 (After Hours)  Emergency Shelter [facility] (336) 271-5985  [facility] (336) 275-9566 [facility] (704) 372-4663  MRSA Hotline #:   832-7006    [place] Resources  [facility] [place]     United Way                          [facility] [address] [place]                       [address]      [address]  [place]                                                [place]                            [place] Phone:  [phone]                                     Phone:  [phone]                 Phone:   [phone]  [facility] Phone:  [phone]  [facility] Child Abuse Hotline 7203245593 579-483-3906 (After Hours)  Hypokalemia Hypokalemia means a low potassium level in the blood.Potassium is an electrolyte that helps regulate the amount of fluid in the body. It also stimulates muscle contraction and maintains a stable acid-base balance.Most of the body's potassium is inside of cells, and only a very small amount is in the blood. Because the amount in the blood is so small, minor changes can have big effects. PREPARATION FOR TEST Testing for potassium requires taking a blood sample taken by needle from a vein in the arm. The skin is cleaned thoroughly before the sample is drawn. There is no other special preparation needed. NORMAL VALUES Potassium levels below 3.5 mEq/L are abnormally low. Levels above 5.1 mEq/L are abnormally high. Ranges for normal findings may vary among different laboratories and hospitals. You should always check with your doctor after having lab work or other tests done to discuss the meaning of your test results and whether your values are considered within normal limits. MEANING OF TEST  Your caregiver will go over the test results with you and discuss the importance and meaning of your results, as well as treatment options and the need for additional tests, if necessary. A potassium level is frequently part of a routine medical exam. It is usually included as part of a whole "panel" of tests for several blood salts (such as Sodium and Chloride). It may be done as part of follow-up when a low potassium level was found in the past or other blood salts are suspected of being out of balance. A low potassium level might be suspected if you have one or more of the following:  Symptoms of weakness.   Abnormal heart rhythms.   High blood pressure and are taking medication to control this, especially water pills (diuretics).   Kidney disease  that can affect your potassium level .   Diabetes requiring the use of insulin. The potassium may fall after taking insulin, especially if the diabetes had been out of control for a while.   A condition requiring the use of cortisone-type medication or certain types of antibiotics.   Vomiting and/or diarrhea for more than a day or two.   A stomach or intestinal condition that may not permit appropriate absorption of potassium.   Fainting episodes.   Mental confusion.  OBTAINING TEST RESULTS It is your responsibility to obtain your test results. Ask the lab or department performing the test when and how you will get your results.  Please contact your caregiver directly if you have not received the results within one week. At that time, ask if there is anything different or new you should be doing in relation to the results. TREATMENT Hypokalemia can be treated with potassium supplements taken by mouth and/or adjustments in your current medications. A diet high in potassium is also helpful. Foods with high potassium content are:  Peas, lentils, lima beans, nuts, and dried fruit.   Whole grain and bran cereals and breads.   Fresh fruit, vegetables (bananas, cantaloupe, grapefruit, oranges, tomatoes, honeydew melons, potatoes).   Orange and tomato juices.   Meats. If potassium supplement has been prescribed for you today or your medications have been adjusted, see your personal caregiver in time02 for a re-check.  SEEK MEDICAL CARE IF:  There is a feeling of worsening weakness.   You experience repeated chest palpitations.   You are diabetic and having difficulty keeping your blood sugars in the normal range.   You are experiencing vomiting and/or diarrhea.   You are having difficulty with any of your regular medications.  SEEK IMMEDIATE MEDICAL CARE IF:  You experience chest pain, shortness of breath, or episodes of dizziness.   You have been having vomiting or diarrhea for  more than 2 days.   You have a fainting episode.  MAKE SURE YOU:   Understand these instructions.   Will watch your condition.   Will get help right away if you are not doing well or get worse.  Document Released: 07/08/2005 Document Revised: 03/20/2011 Document Reviewed: 06/18/2008 [facility] Patient Information 2012 [place], [place].  RESOURCE GUIDE  Dental Problems  Patients with Medicaid: [facility] [phone]                                           [phone] Phone:  [phone]                                                  Phone:  [phone]  If unable to pay or uninsured, contact:  [facility] or [facility]. to become qualified for the adult dental clinic.  Chronic Pain Problems Contact [facility]  [phone] Patients need to be referred by their primary care doctor.  Insufficient Money for Medicine Contact United Way:  call "211" or [facility] 4167519286.  No Primary Care Doctor Call [facility]  ([phone] Other agencies that provide inexpensive medical care    [facility]  [phone]    [facility]  [phone]    [facility]  [phone]    [facility]  [phone]    [facility]  [phone]    [facility]  [phone]  Psychological Services [facility]  [phone] [facility]  [phone] [facility]   [phone] (emergency services [phone])  Substance Abuse Resources [facility]  2081667862 [facility] (301)729-9696 [facility] [phone] Floydene Flock [phone] Residential & Outpatient Substance Abuse Program  ([phone]  Abuse/Neglect [facility] Child Abuse Hotline 803-462-8208 [facility] Child Abuse Hotline 769-660-6175 (After Hours)  Emergency Shelter [facility] [phone]  M[facility] Homes [facility] [place] [facility] 5106074098 [facility] [phone]  MRSA Hotline #:   (205)705-0539    [facility] Resources  [facility] [place]     United Way                          [facility] [address] [place]                       [address]      [address]  [address]  [place]ela Hua Phone:  [phone]                                   Phone:  [phone]                 Phone:  [place] Phone:  [place] [phone] (After Hours)

## 2011-09-18 NOTE — ED Notes (Signed)
Patient resting and remains on monitor and 2L oxygen with sats of 94%. Family at bedside.

## 2011-09-18 NOTE — ED Notes (Signed)
Patient remains on monitor and resting with NAD. Family at bedside.

## 2011-09-18 NOTE — ED Notes (Signed)
Patient remains on monitor and 2L oxygen with sats of 93%. Family at bedside.

## 2011-09-18 NOTE — ED Provider Notes (Signed)
History    46 year old male with chest pain shortness of breath. Relatively acute onset around 11:30 today. Onset was just before eating lunch. Patient doesn't remember his exact activity at onset. Since onset since having constant. Describes the pain as a pressure in the center of his chest. No appreciable exacerbating relieving factors. Mild dyspnea which is constant as well. No fevers or chills. . Denies history of blood clot. No unusual swelling. No fevers or chills. No sick contacts. Patient does have a history of hyperlipidemia, hypertension and diabetes. No diagnosed coronary disease that he is aware of. Says previous heart catheterization but unaware of the results of this. When reviewing records did not identify catheterization report. Did see an echo from 2 years ago though which was relatively normal. [facility]  CSN: [id number]  Arrival date & time 09/18/11  1359   First MD Initiated Contact with Patient 09/18/11 1401      Chief Complaint  Patient presents with  . Chest Pain    (Consider location/radiation/quality/duration/timing/severity/associated sxs/prior treatment) HPI  Past Medical History  Diagnosis Date  . Diabetes mellitus   . Hyperlipidemia   . Hypertension   . Kidney disease   . GERD (gastroesophageal reflux disease)     Past Surgical History  Procedure Date  . Appendectomy   . Cholecystectomy     Family History  Problem Relation Age of Onset  . Diabetes Mother   . Heart attack Mother   . Stroke Mother   . Diabetes Father     History  Substance Use Topics  . Smoking status: Not on file  . Smokeless tobacco: Not on file  . Alcohol Use:       Review of Systems   Review of symptoms negative unless otherwise noted in HPI.   Allergies  Codeine  Home Medications   Current Outpatient Rx  Name Route Sig Dispense Refill  . ASPIRIN 81 MG PO TABS Oral Take 160 mg by mouth daily.      . BUPROPION HCL ER (SR) 100 MG PO TB12 Oral Take 100  mg by mouth 2 (two) times daily.    Marland Kitchen DIVALPROEX SODIUM ER 500 MG PO TB24 Oral Take 1,000 mg by mouth daily.    . INSULIN ASPART 100 UNIT/ML [place] SOLN Subcutaneous Inject 10-20 Units into the skin 3 (three) times daily before meals.    . INSULIN GLARGINE 100 UNIT/ML [place] SOLN Subcutaneous Inject 20 Units into the skin at bedtime.     Marland Kitchen LISINOPRIL 20 MG PO TABS Oral Take 20 mg by mouth daily.      Marland Kitchen METOPROLOL SUCCINATE ER 25 MG PO TB24 Oral Take 25 mg by mouth daily.    Marland Kitchen PRAVASTATIN SODIUM 40 MG PO TABS Oral Take 40 mg by mouth at bedtime.    Marland Kitchen TADALAFIL 5 MG PO TABS Oral Take 5 mg by mouth daily as needed. E.d.    Marland Kitchen TRAZODONE HCL 100 MG PO TABS Oral Take 100 mg by mouth at bedtime.      BP 98/59  Pulse 102  Temp(Src) 97.9 F (36.6 C) (Oral)  Resp 26  SpO2 91%  Physical Exam  Nursing note and vitals reviewed. Constitutional: He appears well-developed and well-nourished. No distress.       Laying in bed. No acute distress  HENT:  Head: Normocephalic and atraumatic.  Eyes: Conjunctivae are normal. Right eye exhibits no discharge. Left eye exhibits no discharge.       Disconjugate gaze  Neck: Normal  range of motion. Neck supple.  Cardiovascular: Regular rhythm and normal heart sounds.  Exam reveals no gallop and no friction rub.   No murmur heard.      Mildly tachycardic with a regular rhythm. No murmur or rub appreciated.  Pulmonary/Chest: Effort normal and breath sounds normal. No respiratory distress.  Abdominal: Soft. He exhibits no distension. There is no tenderness.  Musculoskeletal: He exhibits no edema and no tenderness.       Multiple scars to left lower extremity. Patient reports this is from an accident in childhood. No Tenderness. No significance swelling appreciated.  Neurological: He is alert.  Skin: Skin is warm and dry. He is not diaphoretic.  Psychiatric: He has a normal mood and affect. His behavior is normal. Thought content normal.    ED Course  Procedures  (including critical care time)  Labs Reviewed  CBC - Abnormal; Notable for the following:    WBC 16.0 (*)    All other components within normal limits  BASIC METABOLIC PANEL - Abnormal; Notable for the following:    Potassium 2.7 (*)    Glucose, Bld 55 (*)    GFR calc non Af Amer 84 (*)    All other components within normal limits  TROPONIN I   Dg Chest 2 View  09/18/2011  *RADIOLOGY REPORT*  Clinical Data: Chest pain, shortness of breath, dizziness, history hypertension, diabetes, hyperlipidemia  CHEST - 2 VIEW  Comparison: 09/11/2008  Findings: Enlargement of cardiac silhouette. Mediastinal contours and pulmonary vascularity normal. Chronic accentuation of basilar markings. Minimal chronic peribronchial thickening. No pulmonary infiltrate, pleural effusion or pneumothorax. No acute osseous findings.  IMPRESSION: Enlargement of cardiac silhouette. Bronchitic changes with probable mild chronic bibasilar atelectasis.  Original Report Authenticated By: Lollie Marrow, M.D.   Ct Angio Chest W/cm &/or Wo Cm  09/18/2011  *RADIOLOGY REPORT*  Clinical Data: 46 year old male with chest pain, shortness of breath, nausea.  CT ANGIOGRAPHY CHEST  Technique:  Multidetector CT imaging of the chest using the standard protocol during bolus administration of intravenous contrast. Multiplanar reconstructed images including MIPs were obtained and reviewed to evaluate the vascular anatomy.  Contrast:  100 ml Omnipaque-300.  Comparison: Chest radiographs from the same day and earlier. CT abdomen and pelvis 11/29/2008 and earlier.  Findings: Adequate contrast bolus timing in the pulmonary arterial tree.  Mild respiratory motion artifact intermittently noted.  No convincing pulmonary artery filling defect.  Pericardial calcifications are noted along the left heart border and at the base of the heart (series 6 image 53).  No pericardial effusion.  No pleural effusion.  No mediastinal lymphadenopathy. There is a degree of  mediastinal lipomatosis.  Negative visualized thoracic inlet.  Some coronary artery calcified atherosclerosis is suspected.  Major airways are patent with mild atelectatic changes.  Dependent bilateral pulmonary atelectasis and crowding of lung markings.  No consolidation.  No acute osseous abnormality identified.  In the visualized upper abdomen there is a geographic area of decreased density in the liver (series 6 image 77).  This is stable compared to the previous abdominal studies.  The gallbladder surgically absent.  The liver contour is within normal limits. Other visualized upper abdominal viscera are within normal limits.  IMPRESSION: 1. No evidence of acute pulmonary embolus. 2.  Chronic calcific pericarditis.  No pericardial or pleural effusion. 3.  Pulmonary atelectasis. 4. Chronic geographic hepatic steatosis.  Original Report Authenticated By: Ulla Potash III, M.D.    EKG:  Rhythm: Sinus tachycardia  Rate: 100 and  Axis: normal axis Intervals: Prolonged QTc at 513 ms. Large s wave in lead 1. ST segments: Nonspecific ST changes.   1. Chest pain   2. Hypokalemia       MDM  46 year old male with chest pain and dyspnea. Patient is mildly tachycardic and also hypoxic on initial vitals. Given this, he is not low risk for PE. CT angiography was performed.  No CT evidence of PE or other pathology which would explain pt's symptoms. An initial negative troponin and a negative troponin on repeat 8 hours after the onset of symptoms. Potassium was repleted. Suspect some component of anxiety. Return precautions discussed. outpt fu.      Raeford Razor, MD 09/19/11 [phone]

## 2011-11-01 ENCOUNTER — Emergency Department (INDEPENDENT_AMBULATORY_CARE_PROVIDER_SITE_OTHER): Payer: Medicare Other

## 2011-11-01 ENCOUNTER — Inpatient Hospital Stay (HOSPITAL_BASED_OUTPATIENT_CLINIC_OR_DEPARTMENT_OTHER)
Admission: EM | Admit: 2011-11-01 | Discharge: 2011-11-03 | DRG: 203 | Disposition: A | Discharge status: Home / Self Care | Payer: Medicare Other | Source: Ambulatory Visit | Attending: Internal Medicine | Admitting: Internal Medicine

## 2011-11-01 ENCOUNTER — Encounter (HOSPITAL_BASED_OUTPATIENT_CLINIC_OR_DEPARTMENT_OTHER): Payer: Self-pay | Admitting: *Deleted

## 2011-11-01 DIAGNOSIS — R0602 Shortness of breath: Secondary | ICD-10-CM

## 2011-11-01 DIAGNOSIS — J209 Acute bronchitis, unspecified: Principal | ICD-10-CM | POA: Diagnosis present

## 2011-11-01 DIAGNOSIS — Z833 Family history of diabetes mellitus: Secondary | ICD-10-CM

## 2011-11-01 DIAGNOSIS — J40 Bronchitis, not specified as acute or chronic: Secondary | ICD-10-CM

## 2011-11-01 DIAGNOSIS — R05 Cough: Secondary | ICD-10-CM

## 2011-11-01 DIAGNOSIS — F3289 Other specified depressive episodes: Secondary | ICD-10-CM | POA: Diagnosis present

## 2011-11-01 DIAGNOSIS — R0789 Other chest pain: Secondary | ICD-10-CM | POA: Diagnosis present

## 2011-11-01 DIAGNOSIS — R059 Cough, unspecified: Secondary | ICD-10-CM

## 2011-11-01 DIAGNOSIS — I10 Essential (primary) hypertension: Secondary | ICD-10-CM

## 2011-11-01 DIAGNOSIS — E162 Hypoglycemia, unspecified: Secondary | ICD-10-CM

## 2011-11-01 DIAGNOSIS — E1169 Type 2 diabetes mellitus with other specified complication: Secondary | ICD-10-CM | POA: Diagnosis present

## 2011-11-01 DIAGNOSIS — Z7982 Long term (current) use of aspirin: Secondary | ICD-10-CM

## 2011-11-01 DIAGNOSIS — E876 Hypokalemia: Secondary | ICD-10-CM | POA: Diagnosis present

## 2011-11-01 DIAGNOSIS — F329 Major depressive disorder, single episode, unspecified: Secondary | ICD-10-CM | POA: Diagnosis present

## 2011-11-01 DIAGNOSIS — R9431 Abnormal electrocardiogram [ECG] [EKG]: Secondary | ICD-10-CM

## 2011-11-01 LAB — DIFFERENTIAL
Basophils Absolute: 0 10*3/uL (ref 0.0–0.1)
Basophils Relative: 0 % (ref 0–1)
Eosinophils Absolute: 0.1 10*3/uL (ref 0.0–0.7)
Neutro Abs: 7.3 10*3/uL (ref 1.7–7.7)
Neutrophils Relative %: 62 % (ref 43–77)

## 2011-11-01 LAB — TROPONIN I: Troponin I: <0.3 ng/mL (ref <0.30)

## 2011-11-01 LAB — GLUCOSE, CAPILLARY
Glucose-Capillary: 109 mg/dL — ABNORMAL HIGH (ref 70–99)
Glucose-Capillary: 40 mg/dL — CL (ref 70–99)
Glucose-Capillary: 102 mg/dL — ABNORMAL HIGH (ref 70–99)

## 2011-11-01 LAB — CBC
MCH: 31.1 pg (ref 26.0–34.0)
MCHC: 34.7 g/dL (ref 30.0–36.0)
RDW: 12.9 % (ref 11.5–15.5)

## 2011-11-01 LAB — BASIC METABOLIC PANEL
BUN: 9 mg/dL (ref 6–23)
Chloride: 105 mEq/L (ref 96–112)
Creatinine, Ser: 0.8 mg/dL (ref 0.50–1.35)
GFR calc Af Amer: >90 mL/min (ref >90)
GFR calc non Af Amer: >90 mL/min (ref >90)
Potassium: 2.6 mEq/L — CL (ref 3.5–5.1)

## 2011-11-01 MED ORDER — SODIUM CHLORIDE 0.9 % IV BOLUS (SEPSIS)
1000.0000 mL | Freq: Once | INTRAVENOUS | Status: AC
  Administered 2011-11-01: 1000 mL via INTRAVENOUS

## 2011-11-01 MED ORDER — POTASSIUM CHLORIDE 20 MEQ/15ML (10%) PO LIQD
40.0000 meq | Freq: Once | ORAL | Status: AC
  Administered 2011-11-01: 40 meq via ORAL
  Filled 2011-11-01: qty 15

## 2011-11-01 NOTE — ED Provider Notes (Addendum)
History     CSN: [id number]  Arrival date & time 11/01/11  2037   First MD Initiated Contact with Patient 11/01/11 2147      Chief Complaint  Patient presents with  . URI    (Consider location/radiation/quality/duration/timing/severity/associated sxs/prior treatment) HPI  C/o congestion x 3 days. Nasal congestion, rhinorrhea, sore throat. Began to cough today, +phlegm unk color. +Shortness of breath. Chest soreness with coughing only. Denies fever +chills. States "my sugar has been bottoming out". Reports to as low as 51 at home x 3 times in the past two days. Denies rash. Denies hematuria/dysuria/freq/urgency. +nausea without vomiting. Tolerating PO without complaints.  Denies N/V/diaphoresis. Last insulin use [facility] at [facility]  Has been taking tylenol cold/sinus without relief. Does not feel like his typical seasonal allergies   ED Notes, ED Provider Notes from 11/01/11 0000 to 11/01/11 20:53:20       Julieanne Manson, RN 11/01/2011 20:51      3 days he has had cough, fever, chills, runny nose and sore throat. Has been taking Tylenol cold and sinus. Hx of diabetes. Last time he felt like this he had pneumonia.    Past Medical History  Diagnosis Date  . Diabetes mellitus   . Hyperlipidemia   . Hypertension   . Kidney disease   . GERD (gastroesophageal reflux disease)     Past Surgical History  Procedure Date  . Appendectomy   . Cholecystectomy     Family History  Problem Relation Age of Onset  . Diabetes Mother   . Heart attack Mother   . Stroke Mother   . Diabetes Father     History  Substance Use Topics  . Smoking status: Never Smoker   . Smokeless tobacco: Not on file  . Alcohol Use: No    Review of Systems  All other systems reviewed and are negative.  except as noted HPI   Allergies  Codeine  Home Medications   Current Outpatient Rx  Name Route Sig Dispense Refill  . ASPIRIN 81 MG PO TABS Oral Take 160 mg by mouth daily.      . BUPROPION HCL ER  (SR) 100 MG PO TB12 Oral Take 100 mg by mouth 2 (two) times daily.    Marland Kitchen DIVALPROEX SODIUM ER 500 MG PO TB24 Oral Take 1,000 mg by mouth daily.    . IBUPROFEN 200 MG PO TABS Oral Take 800 mg by mouth every 6 (six) hours as needed. Patient used this medication for headache and body aches.    . INSULIN ASPART 100 UNIT/ML [place] SOLN Subcutaneous Inject 10-20 Units into the skin 3 (three) times daily before meals.    . INSULIN GLARGINE 100 UNIT/ML [place] SOLN Subcutaneous Inject 20 Units into the skin at bedtime.     Marland Kitchen LISINOPRIL 20 MG PO TABS Oral Take 20 mg by mouth daily.      Marland Kitchen METOPROLOL SUCCINATE ER 25 MG PO TB24 Oral Take 25 mg by mouth daily.    Marland Kitchen PRAVASTATIN SODIUM 40 MG PO TABS Oral Take 40 mg by mouth at bedtime.    Ronney Asters COLD PO Oral Take 2 tablets by mouth daily as needed.    . TRAZODONE HCL 100 MG PO TABS Oral Take 100 mg by mouth at bedtime.    . AZITHROMYCIN 250 MG PO TABS Oral Take 1 tablet (250 mg total) by mouth daily. Take first 2 tablets together, then 1 every day until finished. 6 tablet 0  . BENZONATATE 100  MG PO CAPS Oral Take 1 capsule (100 mg total) by mouth every 8 (eight) hours. 21 capsule 0  . TADALAFIL 5 MG PO TABS Oral Take 5 mg by mouth daily as needed. E.d.      BP 133/77  Pulse 104  Temp(Src) 97.6 F (36.4 C) (Oral)  Resp 20  SpO2 95%  Physical Exam  Nursing note and vitals reviewed. Constitutional: He is oriented to person, place, and time. He appears well-developed and well-nourished. No distress.  HENT:  Head: Atraumatic.       Mm dry  Eyes: Conjunctivae are normal. Pupils are equal, round, and reactive to light.  Neck: Neck supple.  Cardiovascular: Normal rate, regular rhythm, normal heart sounds and intact distal pulses.  Exam reveals no gallop and no friction rub.   No murmur heard. Pulmonary/Chest: Effort normal. No respiratory distress. He has no wheezes. He has no rales.  Abdominal: Soft. Bowel sounds are normal. There is no tenderness. There is no  rebound and no guarding.  Musculoskeletal: Normal range of motion. He exhibits no edema and no tenderness.  Neurological: He is alert and oriented to person, place, and time.  Skin: Skin is warm and dry.  Psychiatric: He has a normal mood and affect.    Date: 11/01/2011  Rate: 104  Rhythm: sinus tachycardia  QRS Axis: normal  Intervals: normal  ST/T Wave abnormalities: nonspecific ST changes and nonspecific T wave changes  Conduction Disutrbances:none  Narrative Interpretation:   Old EKG Reviewed: changes noted   ED Course  Procedures (including critical care time)  Labs Reviewed  CBC - Abnormal; Notable for the following:    WBC 11.9 (*)    All other components within normal limits  DIFFERENTIAL - Abnormal; Notable for the following:    Monocytes Absolute 1.3 (*)    All other components within normal limits  BASIC METABOLIC PANEL - Abnormal; Notable for the following:    Potassium 2.6 (*)    Glucose, Bld 101 (*)    All other components within normal limits  GLUCOSE, CAPILLARY - Abnormal; Notable for the following:    Glucose-Capillary 40 (*)    All other components within normal limits  GLUCOSE, CAPILLARY - Abnormal; Notable for the following:    Glucose-Capillary 109 (*)    All other components within normal limits  GLUCOSE, CAPILLARY - Abnormal; Notable for the following:    Glucose-Capillary 102 (*)    All other components within normal limits  TROPONIN I  GLUCOSE, CAPILLARY   Dg Chest 2 View  11/01/2011  *RADIOLOGY REPORT*  Clinical Data: Cough, shortness of breath, flu-like symptoms  CHEST - 2 VIEW  Comparison: CT chest dated 09/18/2011  Findings: Lungs are essentially clear.  No pleural effusion or pneumothorax.  Heart is top normal in size.  Visualized osseous structures are within normal limits.  IMPRESSION: No evidence of acute cardiopulmonary disease.  Original Report Authenticated By: Charline Bills, M.D.    1. Bronchitis   2. Hypoglycemia   3. Abnormal  EKG     MDM  H/o IDDM pw persistent hypoglycemia, cough/URI type symptoms with atypical chest pain, likely from coughing. Pt with h/o HTn, DM, HLD, abnl EKG changes from previous including min st depression/t wave inversion inferior leads. Glu 40 in ED--> 100s--> 70s even with food. Patient and family not comfortable with discharge home in middle of the night. Patient also with ekg changes. First troponin negative. Will discuss admit with triad hospitalist.  Forbes Cellar, MD 11/02/11 0272  Forbes Cellar, MD 11/02/11 5366

## 2011-11-01 NOTE — ED Notes (Signed)
Critical potassium 2.6. Reported to Dr. Hyman Hopes.

## 2011-11-01 NOTE — ED Notes (Signed)
3 days he has had cough, fever, chills, runny nose and sore throat. Has been taking Tylenol cold and sinus. Hx of diabetes. Last time he felt like this he had pneumonia.

## 2011-11-01 NOTE — ED Notes (Signed)
Pt states he is feeling better, drank 120 cc fluid,

## 2011-11-02 ENCOUNTER — Encounter (HOSPITAL_COMMUNITY): Payer: Self-pay | Admitting: General Practice

## 2011-11-02 LAB — CARDIAC PANEL(CRET KIN+CKTOT+MB+TROPI)
CK, MB: 1.4 ng/mL (ref 0.3–4.0)
CK, MB: 1.9 ng/mL (ref 0.3–4.0)
Total CK: 74 U/L (ref 7–232)
Troponin I: <0.3 ng/mL (ref <0.30)
Troponin I: <0.3 ng/mL (ref <0.30)

## 2011-11-02 LAB — COMPREHENSIVE METABOLIC PANEL
ALT: 18 U/L (ref 0–53)
AST: 12 U/L (ref 0–37)
Albumin: 3 g/dL — ABNORMAL LOW (ref 3.5–5.2)
Calcium: 8.5 mg/dL (ref 8.4–10.5)
Chloride: 106 mEq/L (ref 96–112)
Creatinine, Ser: 0.87 mg/dL (ref 0.50–1.35)
Sodium: 143 mEq/L (ref 135–145)
Total Bilirubin: 0.2 mg/dL — ABNORMAL LOW (ref 0.3–1.2)

## 2011-11-02 LAB — GLUCOSE, CAPILLARY
Glucose-Capillary: 68 mg/dL — ABNORMAL LOW (ref 70–99)
Glucose-Capillary: 70 mg/dL (ref 70–99)

## 2011-11-02 LAB — BASIC METABOLIC PANEL
BUN: 9 mg/dL (ref 6–23)
Calcium: 8.9 mg/dL (ref 8.4–10.5)
GFR calc non Af Amer: 88 mL/min — ABNORMAL LOW (ref >90)
Glucose, Bld: 229 mg/dL — ABNORMAL HIGH (ref 70–99)

## 2011-11-02 MED ORDER — POTASSIUM CHLORIDE 10 MEQ/100ML IV SOLN
10.0000 meq | INTRAVENOUS | Status: AC
  Administered 2011-11-02 (×4): 10 meq via INTRAVENOUS
  Filled 2011-11-02 (×4): qty 100

## 2011-11-02 MED ORDER — IBUPROFEN 800 MG PO TABS
800.0000 mg | ORAL_TABLET | Freq: Four times a day (QID) | ORAL | Status: DC | PRN
  Administered 2011-11-03: 800 mg via ORAL
  Filled 2011-11-02: qty 1

## 2011-11-02 MED ORDER — BENZONATATE 100 MG PO CAPS: 100.0000 mg | ORAL_CAPSULE | Freq: Three times a day (TID) | ORAL | Status: AC

## 2011-11-02 MED ORDER — ASPIRIN 81 MG PO CHEW
CHEWABLE_TABLET | ORAL | Status: AC
  Filled 2011-11-02: qty 1

## 2011-11-02 MED ORDER — METOPROLOL SUCCINATE ER 25 MG PO TB24
25.0000 mg | ORAL_TABLET | Freq: Every day | ORAL | Status: DC
  Administered 2011-11-02 – 2011-11-03 (×2): 25 mg via ORAL
  Filled 2011-11-02 (×2): qty 1

## 2011-11-02 MED ORDER — AZITHROMYCIN 250 MG PO TABS: 250.0000 mg | ORAL_TABLET | Freq: Every day | ORAL | Status: AC

## 2011-11-02 MED ORDER — TRAZODONE HCL 100 MG PO TABS
100.0000 mg | ORAL_TABLET | Freq: Every day | ORAL | Status: DC
  Administered 2011-11-02: 100 mg via ORAL
  Filled 2011-11-02 (×2): qty 1

## 2011-11-02 MED ORDER — OXYCODONE HCL 5 MG PO TABS: 5.0000 mg | ORAL_TABLET | ORAL | Status: DC | PRN

## 2011-11-02 MED ORDER — LEVOFLOXACIN IN D5W 500 MG/100ML IV SOLN
500.0000 mg | INTRAVENOUS | Status: DC
  Administered 2011-11-02 – 2011-11-03 (×2): 500 mg via INTRAVENOUS
  Filled 2011-11-02 (×3): qty 100

## 2011-11-02 MED ORDER — WHITE PETROLATUM GEL
Status: AC
  Administered 2011-11-02: 05:00:00
  Filled 2011-11-02: qty 5

## 2011-11-02 MED ORDER — ASPIRIN 81 MG PO CHEW
81.0000 mg | CHEWABLE_TABLET | Freq: Every day | ORAL | Status: DC
  Administered 2011-11-02 – 2011-11-03 (×3): 81 mg via ORAL
  Filled 2011-11-02 (×3): qty 1

## 2011-11-02 MED ORDER — BENZONATATE 100 MG PO CAPS
100.0000 mg | ORAL_CAPSULE | Freq: Three times a day (TID) | ORAL | Status: DC | PRN
  Administered 2011-11-02: 100 mg via ORAL
  Filled 2011-11-02 (×3): qty 1

## 2011-11-02 MED ORDER — ASPIRIN 81 MG PO CHEW
324.0000 mg | CHEWABLE_TABLET | Freq: Once | ORAL | Status: AC
  Administered 2011-11-02: 324 mg via ORAL
  Filled 2011-11-02: qty 2
  Filled 2011-11-02: qty 1

## 2011-11-02 MED ORDER — LISINOPRIL 20 MG PO TABS
20.0000 mg | ORAL_TABLET | Freq: Every day | ORAL | Status: DC
  Administered 2011-11-02 – 2011-11-03 (×2): 20 mg via ORAL
  Filled 2011-11-02 (×2): qty 1

## 2011-11-02 MED ORDER — DIVALPROEX SODIUM ER 500 MG PO TB24
1000.0000 mg | ORAL_TABLET | Freq: Every day | ORAL | Status: DC
  Administered 2011-11-02: 1000 mg via ORAL
  Filled 2011-11-02 (×3): qty 2

## 2011-11-02 MED ORDER — BUPROPION HCL ER (SR) 100 MG PO TB12
100.0000 mg | ORAL_TABLET | Freq: Two times a day (BID) | ORAL | Status: DC
  Administered 2011-11-02 – 2011-11-03 (×3): 100 mg via ORAL
  Filled 2011-11-02 (×5): qty 1

## 2011-11-02 MED ORDER — ENOXAPARIN SODIUM 40 MG/0.4ML ~~LOC~~ SOLN
40.0000 mg | SUBCUTANEOUS | Status: DC
  Administered 2011-11-02: 40 mg via SUBCUTANEOUS
  Filled 2011-11-02 (×2): qty 0.4

## 2011-11-02 MED ORDER — SODIUM CHLORIDE 0.9 % IV SOLN
INTRAVENOUS | Status: DC
  Administered 2011-11-02: 50 mL via INTRAVENOUS

## 2011-11-02 MED ORDER — SIMVASTATIN 20 MG PO TABS
20.0000 mg | ORAL_TABLET | Freq: Every day | ORAL | Status: DC
  Administered 2011-11-02: 20 mg via ORAL
  Filled 2011-11-02 (×2): qty 1

## 2011-11-02 MED ORDER — INSULIN ASPART 100 UNIT/ML ~~LOC~~ SOLN
0.0000 [IU] | SUBCUTANEOUS | Status: DC
  Administered 2011-11-02: 3 [IU] via SUBCUTANEOUS
  Administered 2011-11-02: 5 [IU] via SUBCUTANEOUS
  Administered 2011-11-03: 3 [IU] via SUBCUTANEOUS
  Administered 2011-11-03 (×2): 2 [IU] via SUBCUTANEOUS
  Administered 2011-11-03: 1 [IU] via SUBCUTANEOUS

## 2011-11-02 MED ORDER — POTASSIUM CHLORIDE CRYS ER 20 MEQ PO TBCR
40.0000 meq | EXTENDED_RELEASE_TABLET | Freq: Once | ORAL | Status: AC
  Administered 2011-11-02: 40 meq via ORAL
  Filled 2011-11-02: qty 2

## 2011-11-02 MED ORDER — MENTHOL 3 MG MT LOZG
1.0000 | LOZENGE | OROMUCOSAL | Status: DC | PRN
  Filled 2011-11-02: qty 9

## 2011-11-02 MED ORDER — DEXTROSE-NACL 5-0.45 % IV SOLN
INTRAVENOUS | Status: DC
  Administered 2011-11-02 – 2011-11-03 (×2): via INTRAVENOUS

## 2011-11-02 NOTE — Progress Notes (Signed)
Mr Blazier feels better since admission. I have seen him at bed side in the presence of his brothers, and I have reviewed his chart. Will continue with mx per Dr Bernell List, hopefully d/c in am.  Frandy Basnett,MD pager [phone].

## 2011-11-02 NOTE — H&P (Signed)
Levi Meyers is an 46 y.o. male.   Chief Complaint: cough, dyspnea, hypoglycemia HPI:  Levi Meyers was seen in the [facility] with symptoms of fever, chills, nasal congestion, rhinorrhea, and sore throat that started approximately 3 days ago. He began to have associated symptoms of cough and dyspnea that started on 11/01/2011. He reports that he his chest hurts with coughing. He states that he does have seasonal allergies but that these symptoms are different and much more severe than symptoms of allergies. He also states that the symptoms are similar to what he experienced in February of this year when he had pneumonia. He has been taking tylenol cold/sinus without relief. He also reports that his blood glucose levels have been low even though he has been eating.     Past Medical History  Diagnosis Date  . Diabetes mellitus   . Hyperlipidemia   . Hypertension   . Kidney disease   . GERD (gastroesophageal reflux disease)     Past Surgical History  Procedure Date  . Appendectomy   . Cholecystectomy     Family History  Problem Relation Age of Onset  . Diabetes Mother   . Heart attack Mother   . Stroke Mother   . Diabetes Father    Social History:  reports that he has never smoked. He does not have any smokeless tobacco history on file. He reports that he does not drink alcohol or use illicit drugs.  Allergies:  Allergies  Allergen Reactions  . Codeine Nausea And Vomiting    Medications Prior to Admission  Medication Dose Route Frequency Provider Last Rate Last Dose  . 0.9 %  sodium chloride infusion   Intravenous Continuous Jinger Neighbors, NP      . aspirin chewable tablet 324 mg  324 mg Oral Once Forbes Cellar, MD   324 mg at 11/02/11 0044  . aspirin tablet 81 mg  81 mg Oral Daily Jinger Neighbors, NP      . buPROPion [facility] SR) 12 hr tablet 100 mg  100 mg Oral BID Jinger Neighbors, NP      . divalproex (DEPAKOTE ER) 24 hr tablet 1,000 mg  1,000 mg Oral Daily Jinger Neighbors, NP      . enoxaparin (LOVENOX) injection 40 mg  40 mg Subcutaneous Q24H Jinger Neighbors, NP      . ibuprofen (ADVIL,MOTRIN) tablet 800 mg  800 mg Oral Q6H PRN Jinger Neighbors, NP      . lisinopril (PRINIVIL,ZESTRIL) tablet 20 mg  20 mg Oral Daily Jinger Neighbors, NP      . metoprolol succinate (TOPROL-XL) 24 hr tablet 25 mg  25 mg Oral Daily Jinger Neighbors, NP      . oxyCODONE (Oxy IR/ROXICODONE) immediate release tablet 5 mg  5 mg Oral Q4H PRN Jinger Neighbors, NP      . potassium chloride 20 MEQ/15ML (10%) liquid 40 mEq  40 mEq Oral Once Forbes Cellar, MD   40 mEq at 11/01/11 2325  . simvastatin (ZOCOR) tablet 20 mg  20 mg Oral q1800 Jinger Neighbors, NP      . sodium chloride 0.9 % bolus 1,000 mL  1,000 mL Intravenous Once Forbes Cellar, MD   1,000 mL at 11/01/11 2229  . traZODone (DESYREL) tablet 100 mg  100 mg Oral QHS Jinger Neighbors, NP      . white petrolatum (VASELINE) gel  Medications Prior to Admission  Medication Sig Dispense Refill  . aspirin 81 MG tablet Take 160 mg by mouth daily.        Marland Kitchen buPROPion (WELLBUTRIN SR) 100 MG 12 hr tablet Take 100 mg by mouth 2 (two) times daily.      . divalproex (DEPAKOTE ER) 500 MG 24 hr tablet Take 1,000 mg by mouth daily.      . insulin aspart (NOVOLOG) 100 UNIT/ML injection Inject 10-20 Units into the skin 3 (three) times daily before meals.      . insulin glargine (LANTUS) 100 UNIT/ML injection Inject 80 Units into the skin at bedtime.       Marland Kitchen lisinopril (PRINIVIL,ZESTRIL) 20 MG tablet Take 20 mg by mouth daily.        . metoprolol succinate (TOPROL-XL) 25 MG 24 hr tablet Take 25 mg by mouth daily.      . pravastatin (PRAVACHOL) 40 MG tablet Take 40 mg by mouth at bedtime.      . traZODone (DESYREL) 100 MG tablet Take 100 mg by mouth at bedtime.      Marland Kitchen azithromycin (ZITHROMAX) 250 MG tablet Take 1 tablet (250 mg total) by mouth daily. Take first 2 tablets together, then 1 every day until finished.  6 tablet  0  . benzonatate (TESSALON) 100 MG  capsule Take 1 capsule (100 mg total) by mouth every 8 (eight) hours.  21 capsule  0  . tadalafil (CIALIS) 5 MG tablet Take 5 mg by mouth daily as needed. E.d.        Results for orders placed during the hospital encounter of 11/01/11 (from the past 48 hour(s))  GLUCOSE, CAPILLARY     Status: Abnormal   Collection Time   11/01/11  9:55 PM      Component Value Range Comment   Glucose-Capillary 40 (*) 70 - 99 (mg/dL)   CBC     Status: Abnormal   Collection Time   11/01/11 10:22 PM      Component Value Range Comment   WBC 11.9 (*) 4.0 - 10.5 (K/uL)    RBC 4.82  4.22 - 5.81 (MIL/uL)    Hemoglobin 15.0  13.0 - 17.0 (g/dL)    HCT 16.1  09.6 - 04.5 (%)    MCV 89.6  78.0 - 100.0 (fL)    MCH 31.1  26.0 - 34.0 (pg)    MCHC 34.7  30.0 - 36.0 (g/dL)    RDW 40.9  81.1 - 91.4 (%)    Platelets 283  150 - 400 (K/uL)   DIFFERENTIAL     Status: Abnormal   Collection Time   11/01/11 10:22 PM      Component Value Range Comment   Neutrophils Relative 62  43 - 77 (%)    Neutro Abs 7.3  1.7 - 7.7 (K/uL)    Lymphocytes Relative 27  12 - 46 (%)    Lymphs Abs 3.2  0.7 - 4.0 (K/uL)    Monocytes Relative 11  3 - 12 (%)    Monocytes Absolute 1.3 (*) 0.1 - 1.0 (K/uL)    Eosinophils Relative 0  0 - 5 (%)    Eosinophils Absolute 0.1  0.0 - 0.7 (K/uL)    Basophils Relative 0  0 - 1 (%)    Basophils Absolute 0.0  0.0 - 0.1 (K/uL)   BASIC METABOLIC PANEL     Status: Abnormal   Collection Time   11/01/11 10:22 PM      Component  Value Range Comment   Sodium 142  135 - 145 (mEq/L)    Potassium 2.6 (*) 3.5 - 5.1 (mEq/L)    Chloride 105  96 - 112 (mEq/L)    CO2 24  19 - 32 (mEq/L)    Glucose, Bld 101 (*) 70 - 99 (mg/dL)    BUN 9  6 - 23 (mg/dL)    Creatinine, Ser 1.61  0.50 - 1.35 (mg/dL)    Calcium 9.6  8.4 - 10.5 (mg/dL)    GFR calc non Af Amer >90  >90 (mL/min)    GFR calc Af Amer >90  >90 (mL/min)   TROPONIN I     Status: Normal   Collection Time   11/01/11 10:22 PM      Component Value Range Comment    Troponin I <0.30  <0.30 (ng/mL)   GLUCOSE, CAPILLARY     Status: Abnormal   Collection Time   11/01/11 10:25 PM      Component Value Range Comment   Glucose-Capillary 109 (*) 70 - 99 (mg/dL)   GLUCOSE, CAPILLARY     Status: Abnormal   Collection Time   11/01/11 11:12 PM      Component Value Range Comment   Glucose-Capillary 102 (*) 70 - 99 (mg/dL)   GLUCOSE, CAPILLARY     Status: Normal   Collection Time   11/02/11 12:12 AM      Component Value Range Comment   Glucose-Capillary 70  70 - 99 (mg/dL)   GLUCOSE, CAPILLARY     Status: Abnormal   Collection Time   11/02/11  3:44 AM      Component Value Range Comment   Glucose-Capillary 68 (*) 70 - 99 (mg/dL)    Comment 1 Documented in Chart      Comment 2 Notify RN      Dg Chest 2 View  11/01/2011  *RADIOLOGY REPORT*  Clinical Data: Cough, shortness of breath, flu-like symptoms  CHEST - 2 VIEW  Comparison: CT chest dated 09/18/2011  Findings: Lungs are essentially clear.  No pleural effusion or pneumothorax.  Heart is top normal in size.  Visualized osseous structures are within normal limits.  IMPRESSION: No evidence of acute cardiopulmonary disease.  Original Report Authenticated By: Charline Bills, M.D.    Review of Systems  Constitutional: Positive for fever and chills.  HENT: Positive for congestion and sore throat. Negative for hearing loss, nosebleeds and neck pain.   Eyes: Negative for blurred vision and double vision.  Respiratory: Positive for cough, sputum production and shortness of breath. Negative for hemoptysis and stridor.   Cardiovascular: Negative for chest pain, palpitations, orthopnea and leg swelling.  Gastrointestinal: Negative for heartburn, nausea, vomiting, abdominal pain and diarrhea.  Genitourinary: Negative for dysuria, urgency, frequency and hematuria.  Musculoskeletal: Negative for back pain.  Skin: Negative for rash.  Neurological: Negative for dizziness, focal weakness and headaches.    Endo/Heme/Allergies: Does not bruise/bleed easily.  Psychiatric/Behavioral: Negative for depression. The patient is not nervous/anxious.     Blood pressure 136/86, pulse 86, temperature 98.4 F (36.9 C), temperature source Oral, resp. rate 19, height 5' 5.5" (1.664 m), weight 100.472 kg (221 lb 8 oz), SpO2 98.00%. Physical Exam  Constitutional: He is oriented to person, place, and time. He appears well-developed and well-nourished. No distress.  HENT:  Head: Normocephalic and atraumatic.       Mucous membranes are dry and the is mild erythema of the posterior pharynx  Eyes: Conjunctivae and EOM are normal. Pupils are  equal, round, and reactive to light.  Neck: Normal range of motion. Neck supple. No thyromegaly present.  Cardiovascular: Normal rate, regular rhythm, normal heart sounds and intact distal pulses.  Exam reveals no gallop and no friction rub.   No murmur heard. Respiratory: Effort normal and breath sounds normal. No respiratory distress.  GI: Soft. Bowel sounds are normal. There is no tenderness.  Musculoskeletal: Normal range of motion. He exhibits no edema.  Lymphadenopathy:    He has no cervical adenopathy.  Neurological: He is alert and oriented to person, place, and time. No cranial nerve deficit.  Skin: Skin is warm and dry. No rash noted. No erythema.  Psychiatric: He has a normal mood and affect. His behavior is normal. Judgment and thought content normal.     Assessment/Plan 1. URI. As chest xray is negative, symptoms are likely due to sinusitis. Will treat with Levaquin. Treat symptoms with anti-tussive and throat lozenges. 2. Hypoglycemia. Monitor CBG every 4 hours. Will hold Lantus insulin for now and use sliding scale Novolog insulin if needed. 3. Hypertension: continue Lisinopril and Toprol XL. 4. Diabetes.  See #2. 5. Chest pain. Likely musculoskeletal due to coughing (see #1) however as EKG from ED was abnormal. Will continue to cycle cardiac  enzymes/troponin. 6. Depression: continue Bupropion and Depakote. 7. Prophylaxis. Lovenox for DVT prophylaxis. 8. FEN:  IVF with D51/2NS at 50 cc/hr, electrolytes replaced as indicated, carb modified diet. 9. Ethics: Full code.  [facility], MARY A. 11/02/2011, 4:19 AM  Patient seen and examined by me. Appears comfortable chest pain free. Chest pain  - mostly consistent with chest wall pain. But will cycle CE, and monitor Likely viral URI, but for now on Levaquin to cover sinusitis.  Hypoglycemia -  would hold off on lantus for now until BG stabilizes now improving. Last BG was 106. Hypokalemia  - will replace  Levi Meyers 7:07 AM

## 2011-11-02 NOTE — Discharge Instructions (Signed)
Hold your insulin until your dosage is adjusted by your doctor.   Bronchitis Bronchitis is a problem of the air tubes leading to your lungs. This problem makes it hard for air to get in and out of the lungs. You may cough a lot because your air tubes are narrow. Going without care can cause lasting (chronic) bronchitis. HOME CARE   Drink enough fluids to keep your pee (urine) clear or pale yellow.   Use a cool mist humidifier.   Quit smoking if you smoke. If you keep smoking, the bronchitis might not get better.   Only take medicine as told by your doctor.  GET HELP RIGHT AWAY IF:   Coughing keeps you awake.   You start to wheeze.   You become more sick or weak.   You have a hard time breathing or get short of breath.   You cough up blood.   Coughing lasts more than 2 weeks.   You have a fever.   Your baby is older than 3 months with a rectal temperature of 102 F (38.9 C) or higher.   Your baby is 71 months old or younger with a rectal temperature of 100.4 F (38 C) or higher.  MAKE SURE YOU:  Understand these instructions.   Will watch your condition.   Will get help right away if you are not doing well or get worse.  Document Released: 12/25/2007 Document Revised: 06/27/2011 Document Reviewed: 06/09/2009 [facility] Patient Information 2012 [place], [place].  Hypoglycemia (Low Blood Sugar) Hypoglycemia is when the glucose (sugar) in your blood is too low. Hypoglycemia can happen for many reasons. It can happen to people with or without diabetes. Hypoglycemia can develop quickly and can be a medical emergency.  CAUSES  Having hypoglycemia does not mean that you will develop diabetes. Different causes include:  Missed or delayed meals or not enough carbohydrates eaten.   Medication overdose. This could be by accident or deliberate. If by accident, your medication may need to be adjusted or changed.   Exercise or increased activity without adjustments in carbohydrates  or medications.   A nerve disorder that affects body functions like your heart rate, blood pressure and digestion (autonomic neuropathy).   A condition where the stomach muscles do not function properly (gastroparesis). Therefore, medications may not absorb properly.   The inability to recognize the signs of hypoglycemia (hypoglycemic unawareness).   Absorption of insulin - may be altered.   Alcohol consumption.   Pregnancy/menstrual cycles/postpartum. This may be due to hormones.   Certain kinds of tumors. This is very rare.  SYMPTOMS   Sweating.   Hunger.   Dizziness.   Blurred vision.   Drowsiness.   Weakness.   Headache.   Rapid heart beat.   Shakiness.   Nervousness.  DIAGNOSIS  Diagnosis is made by monitoring blood glucose in one or all of the following ways:  Fingerstick blood glucose monitoring.   Laboratory results.  TREATMENT  If you think your blood glucose is low:  Check your blood glucose, if possible. If it is less than 70 mg/dl, take one of the following:   3-4 glucose tablets.    cup juice (prefer clear like apple).    cup "regular" soda pop.   1 cup milk.   -1 tube of glucose gel.   5-6 hard candies.   Do not over treat because your blood glucose (sugar) will only go too high.   Wait 15 minutes and recheck your blood glucose. If  it is still less than 70 mg/dl (or below your target range), repeat treatment.   Eat a snack if it is more than one hour until your next meal.  Sometimes, your blood glucose may go so low that you are unable to treat yourself. You may need someone to help you. You may even pass out or be unable to swallow. This may require you to get an injection of glucagon, which raises the blood glucose. HOME CARE INSTRUCTIONS  Check blood glucose as recommended by your caregiver.   Take medication as prescribed by your caregiver.   Follow your meal plan. Do not skip meals. Eat on time.   If you are going to drink  alcohol, drink it only with meals.   Check your blood glucose before driving.   Check your blood glucose before and after exercise. If you exercise longer or different than usual, be sure to check blood glucose more frequently.   Always carry treatment with you. Glucose tablets are the easiest to carry.   Always wear medical alert jewelry or carry some form of identification that states that you have diabetes. This will alert people that you have diabetes. If you have hypoglycemia, they will have a better idea on what to do.  SEEK MEDICAL CARE IF:   You are having problems keeping your blood sugar at target range.   You are having frequent episodes of hypoglycemia.   You feel you might be having side effects from your medicines.   You have symptoms of an illness that is not improving after 3-4 days.   You notice a change in vision or a new problem with your vision.  SEEK IMMEDIATE MEDICAL CARE IF:   You are a family member or friend of a person whose blood glucose goes below 70 mg/dl and is accompanied by:   Confusion.   A change in mental status.   The inability to swallow.   Passing out.  Document Released: 07/08/2005 Document Revised: 06/27/2011 Document Reviewed: 03/02/2009 [facility] Patient Information 2012 [place], [place].  RESOURCE GUIDE  Dental Problems  Patients with Medicaid: [facility] [phone] [address]                                           [phone] [address] Phone:  [phone]                                                   Phone:  [phone]  If unable to pay or uninsured, contact:  [facility] or [facility]. to become qualified for the adult dental clinic.  Chronic Pain Problems Contact [facility]  [phone] Patients need to be referred by their primary care doctor.  Insufficient Money for Medicine Contact [employer]:  call "211" or [facility]  [phone].  No Primary Care Doctor Call [facility]  [phone] Other agencies that provide inexpensive medical care    [facility]  [phone]    [facility]  [phone]    [facility]  [phone]  [facility]  [phone]    [facility]  [phone]    [facility]  [phone]  Psychological Services [facility]  [phone] [facility]  [phone] [facility]   [phone] (emergency services [phone])  Abuse/Neglect [facility] Child Abuse Hotline 5746933818 [facility] Child Abuse Hotline 972-600-9988 (After Hours)  Emergency Shelter [facility] [phone]  [facility] [place] [facility] [place] [phone] [facility] [phone]  MRSA Hotline #:   (587)079-3910    [facility] Resources  [facility]  [employer]                           [facility] Dept. [address] [place]                     [address]         [address]  Blondell Reveal Phone:  [phone]                                  Phone:  [phone]                   Phone:  [phone]  [facility] Mental [facility] Phone:  [phone]  [facility] Child Abuse Hotline (430)758-9440 [phone] (After Hours)

## 2011-11-02 NOTE — ED Notes (Signed)
I placed a call for consult to the [facility] hospitalist, the call was returned by Dr. Adela Glimpse @ 0127 am.

## 2011-11-02 NOTE — Progress Notes (Signed)
ANTIBIOTIC CONSULT NOTE - INITIAL  Pharmacy Consult for Levaquin Indication: sinusitis  Allergies  Allergen Reactions  . Codeine Nausea And Vomiting    Patient Measurements: Height: 5' 5.5" (166.4 cm) Weight: 221 lb 8 oz (100.472 kg) IBW/kg (Calculated) : 62.65   Vital Signs: Temp: 98.4 F (36.9 C) (04/13 0340) Temp src: Oral (04/13 0340) BP: 136/86 mmHg (04/13 0340) Pulse Rate: 86  (04/13 0340)  Labs:  Basename 11/01/11 2222  WBC 11.9*  HGB 15.0  PLT 283  LABCREA --  CREATININE 0.80   Estimated Creatinine Clearance: 128.3 ml/min (by C-G formula based on Cr of 0.8).  Microbiology: No results found for this or any previous visit (from the past 720 hour(s)).  Medical History: Past Medical History  Diagnosis Date  . Diabetes mellitus   . Hyperlipidemia   . Hypertension   . Kidney disease   . GERD (gastroesophageal reflux disease)     Assessment/Plan: 46yo male with recent CAP c/o generalized URI Sx, CXR negative, to begin IV ABX for sinusitis.  Will begin Levaquin 500mg  IV Q24H and consider changing to PO when appropriate.   Colleen Can PharmD BCPS 11/02/2011,5:18 AM

## 2011-11-03 DIAGNOSIS — J209 Acute bronchitis, unspecified: Secondary | ICD-10-CM | POA: Diagnosis present

## 2011-11-03 LAB — GLUCOSE, CAPILLARY
Glucose-Capillary: 155 mg/dL — ABNORMAL HIGH (ref 70–99)
Glucose-Capillary: 192 mg/dL — ABNORMAL HIGH (ref 70–99)

## 2011-11-03 LAB — COMPREHENSIVE METABOLIC PANEL
ALT: 22 U/L (ref 0–53)
BUN: 9 mg/dL (ref 6–23)
CO2: 27 mEq/L (ref 19–32)
Calcium: 9 mg/dL (ref 8.4–10.5)
Creatinine, Ser: 0.98 mg/dL (ref 0.50–1.35)
GFR calc Af Amer: >90 mL/min (ref >90)
GFR calc non Af Amer: >90 mL/min (ref >90)
Glucose, Bld: 157 mg/dL — ABNORMAL HIGH (ref 70–99)

## 2011-11-03 LAB — CBC
HCT: 40.4 % (ref 39.0–52.0)
Hemoglobin: 13.6 g/dL (ref 13.0–17.0)
MCH: 30.8 pg (ref 26.0–34.0)
MCV: 91.4 fL (ref 78.0–100.0)
RBC: 4.42 MIL/uL (ref 4.22–5.81)

## 2011-11-03 MED ORDER — LEVOFLOXACIN 500 MG PO TABS: 500.0000 mg | ORAL_TABLET | Freq: Every day | ORAL | Status: AC

## 2011-11-03 MED ORDER — ALBUTEROL SULFATE HFA 108 (90 BASE) MCG/ACT IN AERS: 2.0000 | INHALATION_SPRAY | Freq: Four times a day (QID) | RESPIRATORY_TRACT | Status: DC | PRN

## 2011-11-03 NOTE — Discharge Summary (Addendum)
DISCHARGE SUMMARY  Levi Meyers  Levi#: [id number]  DOB:1965-09-14  Date of Admission: 11/01/2011 Date of Discharge: 11/03/2011  Attending Physician:Kierston Plasencia  Patient's UYQ:[id number], Levi Signs, MD, MD  Consults: -none  Discharge Diagnoses: Present on Admission:  .Bronchitis, acute   Hospital Course: Levi Meyers was admitted with worsening cough/runny nose. He had a wbc of 11.9, a normal cxr. He failed outpatient zithromax. He responded well to levaquin/bronchodilators. He likely had acute bronchitis versus early pna. He is much better and eager to go home. Will d/c him on 6 more days of levaquin. He is discharged in stable condition.   Medication List  As of 11/03/2011  2:23 PM   STOP taking these medications         TYLENOL COLD PO         TAKE these medications         albuterol 108 (90 BASE) MCG/ACT inhaler   Commonly known as: PROVENTIL HFA;VENTOLIN HFA   Inhale 2 puffs into the lungs every 6 (six) hours as needed for wheezing.      aspirin 81 MG tablet   Take 160 mg by mouth daily.                  benzonatate 100 MG capsule   Commonly known as: TESSALON   Take 1 capsule (100 mg total) by mouth every 8 (eight) hours.      buPROPion 100 MG 12 hr tablet   Commonly known as: WELLBUTRIN SR   Take 100 mg by mouth 2 (two) times daily.      divalproex 500 MG 24 hr tablet   Commonly known as: DEPAKOTE ER   Take 1,000 mg by mouth daily.      ibuprofen 200 MG tablet   Commonly known as: ADVIL,MOTRIN   Take 800 mg by mouth every 6 (six) hours as needed. Patient used this medication for headache and body aches.      insulin aspart 100 UNIT/ML injection   Commonly known as: novoLOG   Inject 10-20 Units into the skin 3 (three) times daily before meals.      insulin glargine 100 UNIT/ML injection   Commonly known as: LANTUS   Inject 80 Units into the skin at bedtime.      levofloxacin 500 MG tablet   Commonly known as: LEVAQUIN   Take 1 tablet (500 mg total) by  mouth daily.      lisinopril 20 MG tablet   Commonly known as: PRINIVIL,ZESTRIL   Take 20 mg by mouth daily.      metoprolol succinate 25 MG 24 hr tablet   Commonly known as: TOPROL-XL   Take 25 mg by mouth daily.      pravastatin 40 MG tablet   Commonly known as: PRAVACHOL   Take 40 mg by mouth at bedtime.      tadalafil 5 MG tablet   Commonly known as: CIALIS   Take 5 mg by mouth daily as needed. E.d.      traZODone 100 MG tablet   Commonly known as: DESYREL   Take 100 mg by mouth at bedtime.             Day of Discharge BP 129/87  Pulse 84  Temp(Src) 97.7 F (36.5 C) (Oral)  Resp 16  Ht 5' 5.5" (1.664 m)  Wt 101.152 kg (223 lb)  BMI 36.54 kg/m2  SpO2 93%  Physical Exam: At baseline.  Results for orders placed during the hospital encounter of  11/01/11 (from the past 24 hour(s))  CARDIAC PANEL(CRET KIN+CKTOT+MB+TROPI)     Status: Normal   Collection Time   11/02/11  4:31 PM      Component Value Range   Total CK 74  7 - 232 (U/L)   CK, MB 1.9  0.3 - 4.0 (ng/mL)   Troponin I <0.30  <0.30 (ng/mL)   Relative Index RELATIVE INDEX IS INVALID  0.0 - 2.5   BASIC METABOLIC PANEL     Status: Abnormal   Collection Time   11/02/11  4:31 PM      Component Value Range   Sodium 135  135 - 145 (mEq/L)   Potassium 4.1  3.5 - 5.1 (mEq/L)   Chloride 102  96 - 112 (mEq/L)   CO2 23  19 - 32 (mEq/L)   Glucose, Bld 229 (*) 70 - 99 (mg/dL)   BUN 9  6 - 23 (mg/dL)   Creatinine, Ser 1.61  0.50 - 1.35 (mg/dL)   Calcium 8.9  8.4 - 09.6 (mg/dL)   GFR calc non Af Amer 88 (*) >90 (mL/min)   GFR calc Af Amer >90  >90 (mL/min)  VALPROIC ACID LEVEL     Status: Abnormal   Collection Time   11/02/11  4:31 PM      Component Value Range   Valproic Acid Lvl <10.0 (*) 50.0 - 100.0 (ug/mL)  GLUCOSE, CAPILLARY     Status: Abnormal   Collection Time   11/02/11  4:44 PM      Component Value Range   Glucose-Capillary 208 (*) 70 - 99 (mg/dL)  GLUCOSE, CAPILLARY     Status: Abnormal    Collection Time   11/02/11  8:22 PM      Component Value Range   Glucose-Capillary 258 (*) 70 - 99 (mg/dL)  GLUCOSE, CAPILLARY     Status: Abnormal   Collection Time   11/03/11 12:09 AM      Component Value Range   Glucose-Capillary 208 (*) 70 - 99 (mg/dL)   Comment 1 Notify RN    GLUCOSE, CAPILLARY     Status: Abnormal   Collection Time   11/03/11  4:11 AM      Component Value Range   Glucose-Capillary 149 (*) 70 - 99 (mg/dL)  CBC     Status: Normal   Collection Time   11/03/11  5:30 AM      Component Value Range   WBC 9.1  4.0 - 10.5 (K/uL)   RBC 4.42  4.22 - 5.81 (MIL/uL)   Hemoglobin 13.6  13.0 - 17.0 (g/dL)   HCT 04.5  40.9 - 81.1 (%)   MCV 91.4  78.0 - 100.0 (fL)   MCH 30.8  26.0 - 34.0 (pg)   MCHC 33.7  30.0 - 36.0 (g/dL)   RDW 91.4  78.2 - 95.6 (%)   Platelets 264  150 - 400 (K/uL)  COMPREHENSIVE METABOLIC PANEL     Status: Abnormal   Collection Time   11/03/11  5:30 AM      Component Value Range   Sodium 139  135 - 145 (mEq/L)   Potassium 4.0  3.5 - 5.1 (mEq/L)   Chloride 103  96 - 112 (mEq/L)   CO2 27  19 - 32 (mEq/L)   Glucose, Bld 157 (*) 70 - 99 (mg/dL)   BUN 9  6 - 23 (mg/dL)   Creatinine, Ser 2.13  0.50 - 1.35 (mg/dL)   Calcium 9.0  8.4 - 08.6 (mg/dL)  Total Protein 6.5  6.0 - 8.3 (g/dL)   Albumin 3.2 (*) 3.5 - 5.2 (g/dL)   AST 13  0 - 37 (U/L)   ALT 22  0 - 53 (U/L)   Alkaline Phosphatase 84  39 - 117 (U/L)   Total Bilirubin 0.3  0.3 - 1.2 (mg/dL)   GFR calc non Af Amer >90  >90 (mL/min)   GFR calc Af Amer >90  >90 (mL/min)  GLUCOSE, CAPILLARY     Status: Abnormal   Collection Time   11/03/11  7:52 AM      Component Value Range   Glucose-Capillary 155 (*) 70 - 99 (mg/dL)  GLUCOSE, CAPILLARY     Status: Abnormal   Collection Time   11/03/11 11:40 AM      Component Value Range   Glucose-Capillary 192 (*) 70 - 99 (mg/dL)    Disposition: home today.   Follow-up Appts: Discharge Orders    Future Orders Please Complete By Expires   Diet - low  sodium heart healthy      Increase activity slowly         Follow-up Information    Follow up with Your primary care doctor. Schedule an appointment as soon as possible for a visit in 3 days. (call for insulin adjustments)       Follow up with [facility]. (As needed if symptoms worsen)    Contact information:   [address] [place] [address] [phone]      Follow up with Romero Belling, MD .         Tests Needing Follow-up: Bmp/cbc in week.  Time spent in discharge (includes decision making & examination of pt): 20 minutes P/s- I called Levi Wandel and asked him to stop taking zithromax, which I had instructed him to continue by mistake. He will be on levaquin.  Signed: Naresh Althaus 11/03/2011, 2:23 PM

## 2011-11-04 LAB — GLUCOSE, CAPILLARY: Glucose-Capillary: 98 mg/dL (ref 70–99)

## 2011-11-21 ENCOUNTER — Emergency Department (HOSPITAL_COMMUNITY): Payer: Medicare Other

## 2011-11-21 ENCOUNTER — Observation Stay (HOSPITAL_COMMUNITY)
Admission: EM | Admit: 2011-11-21 | Discharge: 2011-11-22 | Disposition: A | Discharge status: Home / Self Care | Payer: Medicare Other | Attending: Internal Medicine | Admitting: Internal Medicine

## 2011-11-21 ENCOUNTER — Encounter (HOSPITAL_COMMUNITY): Payer: Self-pay | Admitting: Emergency Medicine

## 2011-11-21 DIAGNOSIS — E785 Hyperlipidemia, unspecified: Secondary | ICD-10-CM | POA: Insufficient documentation

## 2011-11-21 DIAGNOSIS — I10 Essential (primary) hypertension: Secondary | ICD-10-CM | POA: Diagnosis present

## 2011-11-21 DIAGNOSIS — F329 Major depressive disorder, single episode, unspecified: Secondary | ICD-10-CM | POA: Insufficient documentation

## 2011-11-21 DIAGNOSIS — E162 Hypoglycemia, unspecified: Secondary | ICD-10-CM

## 2011-11-21 DIAGNOSIS — E1169 Type 2 diabetes mellitus with other specified complication: Secondary | ICD-10-CM

## 2011-11-21 DIAGNOSIS — K219 Gastro-esophageal reflux disease without esophagitis: Secondary | ICD-10-CM | POA: Insufficient documentation

## 2011-11-21 DIAGNOSIS — F3289 Other specified depressive episodes: Secondary | ICD-10-CM | POA: Insufficient documentation

## 2011-11-21 DIAGNOSIS — E10649 Type 1 diabetes mellitus with hypoglycemia without coma: Secondary | ICD-10-CM | POA: Diagnosis present

## 2011-11-21 DIAGNOSIS — D72829 Elevated white blood cell count, unspecified: Secondary | ICD-10-CM | POA: Insufficient documentation

## 2011-11-21 DIAGNOSIS — E1069 Type 1 diabetes mellitus with other specified complication: Principal | ICD-10-CM | POA: Insufficient documentation

## 2011-11-21 DIAGNOSIS — Z79899 Other long term (current) drug therapy: Secondary | ICD-10-CM | POA: Insufficient documentation

## 2011-11-21 DIAGNOSIS — Z7982 Long term (current) use of aspirin: Secondary | ICD-10-CM | POA: Insufficient documentation

## 2011-11-21 DIAGNOSIS — E876 Hypokalemia: Secondary | ICD-10-CM | POA: Insufficient documentation

## 2011-11-21 LAB — DIFFERENTIAL
Basophils Absolute: 0 10*3/uL (ref 0.0–0.1)
Basophils Relative: 0 % (ref 0–1)
Eosinophils Absolute: 0.1 10*3/uL (ref 0.0–0.7)
Neutrophils Relative %: 61 % (ref 43–77)

## 2011-11-21 LAB — URINALYSIS, ROUTINE W REFLEX MICROSCOPIC
Glucose, UA: NEGATIVE mg/dL
Hgb urine dipstick: NEGATIVE
Leukocytes, UA: NEGATIVE
Specific Gravity, Urine: 1.023 (ref 1.005–1.030)
Urobilinogen, UA: 0.2 mg/dL (ref 0.0–1.0)

## 2011-11-21 LAB — CBC
MCH: 31.7 pg (ref 26.0–34.0)
MCHC: 35.7 g/dL (ref 30.0–36.0)
Platelets: 342 10*3/uL (ref 150–400)
RBC: 5.17 MIL/uL (ref 4.22–5.81)
RDW: 12.7 % (ref 11.5–15.5)

## 2011-11-21 LAB — COMPREHENSIVE METABOLIC PANEL
ALT: 26 U/L (ref 0–53)
Albumin: 3.9 g/dL (ref 3.5–5.2)
Alkaline Phosphatase: 91 U/L (ref 39–117)
Calcium: 10.2 mg/dL (ref 8.4–10.5)
Potassium: 3.1 mEq/L — ABNORMAL LOW (ref 3.5–5.1)
Sodium: 137 mEq/L (ref 135–145)
Total Protein: 8.1 g/dL (ref 6.0–8.3)

## 2011-11-21 LAB — GLUCOSE, CAPILLARY
Glucose-Capillary: 103 mg/dL — ABNORMAL HIGH (ref 70–99)
Glucose-Capillary: 41 mg/dL — CL (ref 70–99)
Glucose-Capillary: 115 mg/dL — ABNORMAL HIGH (ref 70–99)

## 2011-11-21 MED ORDER — IBUPROFEN 800 MG PO TABS
800.0000 mg | ORAL_TABLET | Freq: Once | ORAL | Status: AC
  Administered 2011-11-21: 800 mg via ORAL
  Filled 2011-11-21: qty 1

## 2011-11-21 MED ORDER — ONDANSETRON HCL 4 MG/2ML IJ SOLN
4.0000 mg | Freq: Once | INTRAMUSCULAR | Status: AC
  Administered 2011-11-21: 4 mg via INTRAVENOUS
  Filled 2011-11-21: qty 2

## 2011-11-21 MED ORDER — SODIUM CHLORIDE 0.9 % IV BOLUS (SEPSIS)
1000.0000 mL | Freq: Once | INTRAVENOUS | Status: AC
  Administered 2011-11-21: 1000 mL via INTRAVENOUS

## 2011-11-21 MED ORDER — DEXTROSE-NACL 5-0.45 % IV SOLN
INTRAVENOUS | Status: DC
  Administered 2011-11-21: 125 mL/h via INTRAVENOUS

## 2011-11-21 NOTE — H&P (Signed)
History and Physical  Levi KUZEL Meyers:[id number] DOB: 06/30/1966 DOA: 11/21/2011  Referring physician: Judie Petit. Alto [place], M.D. PCP: Colette Ribas, MD, MD   Chief Complaint: Hypoglycemia  HPI:  46 year old man presents to the emergency department with recurrent hypoglycemia. He reports symptomatic hypoglycemia the last several days which includes sweatiness, nausea. He is a diabetic and is usually on insulin. He denies any insulin use over the last 48 hours. He did the lunch around 2 PM yesterday but oral intake appears to be decreased.  Workup in the emergency department was notable for recurrent hypoglycemia and he was placed on an infusion of dextrose. He was referred for admission.  Chart Review:  September 2012 psychiatric admission: Maj. depressive disorder. After arguing with his wife the patient injected himself with 800 units of fast acting NovoLog.  01/01/2009 hospitalization: Intentional overdose with Lantus. This occurred in the context of an argument with his wife.  October 2009 hospitalization: Severe hypoglycemia of unclear cause.  Review of Systems:  Negative for fever, changes to his vision, sore throat, rash, muscle aches, chest pain, shortness of breath, dysuria, bleeding, nausea, vomiting.  Past Medical History  Diagnosis Date  . Diabetes mellitus   . Hyperlipidemia   . Hypertension   . Kidney disease   . GERD (gastroesophageal reflux disease)    Past Surgical History  Procedure Date  . Appendectomy   . Cholecystectomy   . Leg surgery   . Strabismus surgery    Social History:  reports that he has never smoked. He does not have any smokeless tobacco history on file. He reports that he does not drink alcohol or use illicit drugs.  Allergies  Allergen Reactions  . Codeine Nausea And Vomiting   Family History  Problem Relation Age of Onset  . Diabetes Mother   . Heart attack Mother   . Stroke Mother   . Diabetes Father    Prior to Admission medications     Medication Sig Start Date End Date Taking? Authorizing Provider  albuterol (PROVENTIL HFA;VENTOLIN HFA) 108 (90 BASE) MCG/ACT inhaler Inhale 2 puffs into the lungs every 6 (six) hours as needed for wheezing. 11/03/11 11/02/12 Yes Simbiso Ranga, MD  aspirin 81 MG tablet Take 81 mg by mouth daily.    Yes Historical Provider, MD  buPROPion (WELLBUTRIN SR) 100 MG 12 hr tablet Take 100 mg by mouth 2 (two) times daily.   Yes Historical Provider, MD  divalproex (DEPAKOTE ER) 500 MG 24 hr tablet Take 1,000 mg by mouth daily.   Yes Historical Provider, MD  ibuprofen (ADVIL,MOTRIN) 200 MG tablet Take 800 mg by mouth every 8 (eight) hours as needed. Patient used this medication for headache and body aches.   Yes Historical Provider, MD  insulin aspart (NOVOLOG) 100 UNIT/ML injection Inject 10-20 Units into the skin 3 (three) times daily before meals.   Yes Historical Provider, MD  insulin glargine (LANTUS) 100 UNIT/ML injection Inject 80 Units into the skin at bedtime.    Yes Historical Provider, MD  lisinopril (PRINIVIL,ZESTRIL) 20 MG tablet Take 20 mg by mouth daily.     Yes Historical Provider, MD  metoprolol succinate (TOPROL-XL) 25 MG 24 hr tablet Take 25 mg by mouth 2 (two) times daily.    Yes Historical Provider, MD  pravastatin (PRAVACHOL) 40 MG tablet Take 40 mg by mouth at bedtime.   Yes Historical Provider, MD  tadalafil (CIALIS) 5 MG tablet Take 5 mg by mouth daily as needed. E.d.   Yes Historical Provider,  MD  traZODone (DESYREL) 100 MG tablet Take 100 mg by mouth at bedtime.   Yes Historical Provider, MD   Physical Exam: Filed Vitals:   11/21/11 2007  BP: 136/99  Pulse: 97  Temp: 97.7 F (36.5 C)  TempSrc: Oral  Resp: 15  Height: 5\' 5"  (1.651 m)  Weight: 99.791 kg (220 lb)  SpO2: 93%    General:  Appears calm and comfortable. Exam and in the emergency department the  Eyes: Pupils grossly unremarkable. Right exotropia noted. Normal lids, irises, conjunctiva appear  ENT: Grossly  normal hearing. Lips and tongue appear unremarkable.  Neck: No lymphadenopathy or masses. No thyromegaly.  Cardiovascular: Regular rate and rhythm. No murmur, rub, gallop. No lower extremity edema.  Respiratory: Clear to auscultation bilaterally. No wheezes, rales, rhonchi. Normal respiratory effort.  Abdomen: Soft, nontender, nondistended.  Skin: Grossly unremarkable.  Musculoskeletal: Grossly unremarkable.  Psychiatric: Grossly normal mood and affect. Speech fluent and appropriate.  Labs on Admission:  Basic Metabolic Panel:  Lab 11/21/11 1308  NA 137  K 3.1*  CL 100  CO2 24  GLUCOSE 42*  BUN 13  CREATININE 0.85  CALCIUM 10.2  MG --  PHOS --   Liver Function Tests:  Lab 11/21/11 2146  AST 15  ALT 26  ALKPHOS 91  BILITOT 0.2*  PROT 8.1  ALBUMIN 3.9    Lab 11/21/11 2146  LIPASE 16  AMYLASE --   CBC:  Lab 11/21/11 2146  WBC 16.1*  NEUTROABS 9.7*  HGB 16.4  HCT 46.0  MCV 89.0  PLT 342   BNP:    Component Value Date/Time   PROBNP <30.0 09/12/2008 0230   CBG:  Lab 11/21/11 2331 11/21/11 2234 11/21/11 2142 11/21/11 2005  GLUCAP 76 115* 41* 103*   Radiological Exams on Admission: Dg Chest 2 View  11/21/2011  *RADIOLOGY REPORT*  Clinical Data: Hypoglycemia, hypertension. Pericarditis history.  CHEST - 2 VIEW  Comparison: 11/01/2011, 09/18/2011 CT.  Findings: Prominent cardiomediastinal contours. No focal consolidation, pleural effusion, or pneumothorax.  No acute osseous abnormality.  IMPRESSION: Prominent cardiomediastinal contours in this patient with known mediastinal lipomatosis. Cannot exclude superimposed pericardial effusion.  Original Report Authenticated By: Waneta Martins, M.D.   Assessment/Plan 1. Persistent hypoglycemia/diabetes: Etiology not clear however the patient has a history of intentional overdose of insulin. He denies any insulin use in the last 48 hours. Check C-peptide. Dextrose infusion. Frequent blood sugar checks. Hold  metoprolol for now as beta blockers can mask symptoms of hypoglycemia. Monitor for rebound hypertension or tachycardia. Further evaluation as indicated. 2. Hypokalemia: Replete. 3. Leukocytosis: Etiology and significance unclear. No obvious infection at this point. Repeat CBC in the morning. 4. Diabetes mellitus: Plan as above. 5. History of depression: Patient reports no additional stress at home. No current psychiatric symptoms or complaints. Monitor. Continue outpatient psychiatric medications.   Code Status: Full code Family Communication: None at bedside. Disposition Plan: Pending further evaluation and treatment  Brendia Sacks, MD  [facility] Pager [phone] 11/22/2011, 12:00 AM

## 2011-11-21 NOTE — ED Notes (Signed)
Patient reports that his blood sugar is running low. He has been in the hospital with low blood sugar

## 2011-11-21 NOTE — ED Notes (Signed)
Pt CBG-103 

## 2011-11-21 NOTE — ED Notes (Signed)
Critical result, serum  Glucose of 42mg /dl., received from lab, specimen was taken an hour before CBG rechecked with result of 115mg /dl., MD aware.

## 2011-11-21 NOTE — ED Notes (Addendum)
Pt.'s latest cbg is 41mg /dl., MD aware, 3 cups of orange juice offered. Will continue to monitor. Pt. Is asymptomatic for hypoglycemia.

## 2011-11-22 ENCOUNTER — Ambulatory Visit (INDEPENDENT_AMBULATORY_CARE_PROVIDER_SITE_OTHER): Payer: Medicare Other | Admitting: Endocrinology

## 2011-11-22 ENCOUNTER — Encounter: Payer: Self-pay | Admitting: Endocrinology

## 2011-11-22 ENCOUNTER — Encounter (HOSPITAL_COMMUNITY): Payer: Self-pay | Admitting: Family Medicine

## 2011-11-22 VITALS — BP 132/82 | HR 118 | Temp 97.6°F | Ht 65.0 in | Wt 219.8 lb

## 2011-11-22 DIAGNOSIS — E10649 Type 1 diabetes mellitus with hypoglycemia without coma: Secondary | ICD-10-CM | POA: Diagnosis present

## 2011-11-22 DIAGNOSIS — E1169 Type 2 diabetes mellitus with other specified complication: Secondary | ICD-10-CM

## 2011-11-22 DIAGNOSIS — H53009 Unspecified amblyopia, unspecified eye: Secondary | ICD-10-CM | POA: Insufficient documentation

## 2011-11-22 DIAGNOSIS — E1069 Type 1 diabetes mellitus with other specified complication: Secondary | ICD-10-CM

## 2011-11-22 LAB — GLUCOSE, CAPILLARY
Glucose-Capillary: 179 mg/dL — ABNORMAL HIGH (ref 70–99)
Glucose-Capillary: 186 mg/dL — ABNORMAL HIGH (ref 70–99)
Glucose-Capillary: 258 mg/dL — ABNORMAL HIGH (ref 70–99)

## 2011-11-22 LAB — CBC
Hemoglobin: 14.5 g/dL (ref 13.0–17.0)
MCH: 30.7 pg (ref 26.0–34.0)
MCV: 90.1 fL (ref 78.0–100.0)
Platelets: 308 10*3/uL (ref 150–400)
RBC: 4.73 MIL/uL (ref 4.22–5.81)

## 2011-11-22 LAB — BASIC METABOLIC PANEL
CO2: 23 mEq/L (ref 19–32)
Calcium: 9.2 mg/dL (ref 8.4–10.5)
Glucose, Bld: 174 mg/dL — ABNORMAL HIGH (ref 70–99)
Sodium: 135 mEq/L (ref 135–145)

## 2011-11-22 MED ORDER — ONDANSETRON HCL 4 MG/2ML IJ SOLN: 4.0000 mg | Freq: Four times a day (QID) | INTRAMUSCULAR | Status: DC | PRN

## 2011-11-22 MED ORDER — ALBUTEROL SULFATE HFA 108 (90 BASE) MCG/ACT IN AERS
2.0000 | INHALATION_SPRAY | Freq: Four times a day (QID) | RESPIRATORY_TRACT | Status: DC | PRN
  Filled 2011-11-22: qty 6.7

## 2011-11-22 MED ORDER — LISINOPRIL 20 MG PO TABS
20.0000 mg | ORAL_TABLET | Freq: Every day | ORAL | Status: DC
  Administered 2011-11-22: 20 mg via ORAL
  Filled 2011-11-22: qty 1

## 2011-11-22 MED ORDER — SIMVASTATIN 20 MG PO TABS
20.0000 mg | ORAL_TABLET | Freq: Every day | ORAL | Status: DC
  Filled 2011-11-22: qty 1

## 2011-11-22 MED ORDER — TRAZODONE HCL 100 MG PO TABS
100.0000 mg | ORAL_TABLET | Freq: Every day | ORAL | Status: DC
  Filled 2011-11-22: qty 1

## 2011-11-22 MED ORDER — POTASSIUM CL IN DEXTROSE 5% 20 MEQ/L IV SOLN
20.0000 meq | INTRAVENOUS | Status: DC
  Administered 2011-11-22 (×2): 20 meq via INTRAVENOUS
  Filled 2011-11-22 (×3): qty 1000

## 2011-11-22 MED ORDER — IBUPROFEN 800 MG PO TABS: 800.0000 mg | ORAL_TABLET | Freq: Three times a day (TID) | ORAL | Status: DC | PRN

## 2011-11-22 MED ORDER — ACETAMINOPHEN 325 MG PO TABS: 650.0000 mg | ORAL_TABLET | Freq: Four times a day (QID) | ORAL | Status: DC | PRN

## 2011-11-22 MED ORDER — ASPIRIN EC 81 MG PO TBEC
81.0000 mg | DELAYED_RELEASE_TABLET | Freq: Every day | ORAL | Status: DC
  Administered 2011-11-22: 81 mg via ORAL
  Filled 2011-11-22: qty 1

## 2011-11-22 MED ORDER — ACETAMINOPHEN 650 MG RE SUPP: 650.0000 mg | Freq: Four times a day (QID) | RECTAL | Status: DC | PRN

## 2011-11-22 MED ORDER — DEXTROSE-NACL 5-0.45 % IV SOLN: INTRAVENOUS | Status: DC

## 2011-11-22 MED ORDER — ALUM & MAG HYDROXIDE-SIMETH 200-200-20 MG/5ML PO SUSP: 30.0000 mL | Freq: Four times a day (QID) | ORAL | Status: DC | PRN

## 2011-11-22 MED ORDER — ONDANSETRON HCL 4 MG/2ML IJ SOLN
4.0000 mg | Freq: Three times a day (TID) | INTRAMUSCULAR | Status: DC | PRN
  Administered 2011-11-22: 4 mg via INTRAVENOUS
  Filled 2011-11-22: qty 2

## 2011-11-22 MED ORDER — DIVALPROEX SODIUM ER 500 MG PO TB24
1000.0000 mg | ORAL_TABLET | Freq: Every day | ORAL | Status: DC
  Filled 2011-11-22 (×2): qty 2

## 2011-11-22 MED ORDER — INSULIN GLARGINE 100 UNIT/ML ~~LOC~~ SOLN: 60.0000 [IU] | Freq: Every day | SUBCUTANEOUS | Status: DC

## 2011-11-22 MED ORDER — INSULIN ASPART 100 UNIT/ML ~~LOC~~ SOLN: SUBCUTANEOUS | Status: DC

## 2011-11-22 MED ORDER — ONDANSETRON HCL 4 MG PO TABS: 4.0000 mg | ORAL_TABLET | Freq: Four times a day (QID) | ORAL | Status: DC | PRN

## 2011-11-22 MED ORDER — BUPROPION HCL ER (SR) 100 MG PO TB12
100.0000 mg | ORAL_TABLET | Freq: Two times a day (BID) | ORAL | Status: DC
  Administered 2011-11-22: 100 mg via ORAL
  Filled 2011-11-22 (×2): qty 1

## 2011-11-22 NOTE — ED Provider Notes (Signed)
History     CSN: [id number]  Arrival date & time 11/21/11  5621   First MD Initiated Contact with Patient 11/21/11 2112      Chief Complaint  Patient presents with  . low blood sugar    . muscle aches     (Consider location/radiation/quality/duration/timing/severity/associated sxs/prior treatment) HPI Patient is a 46 yo M who presents with chief complaint of low blood sugar.  He is a diabetic and reports that his glucose is normally in the 200s.  He was last admitted for this 2 weeks ago and only possible cause was noted to be "slight PNA".  He was discharged on antibiotics and completed these.  Today he complains of nusea and diffuse muscle aches.  Patient had BG of 40s at home and ate before coming for this.  Initial value here is 41.  Patient denies rash, fever, cough, diarrhea, or known sick contacts.  Patient does endorse vomiting today.  His symptoms have been persistent since last discharge but not this low. Patient reports taking no insulin for the past 2 days because of this and is not on oral hypoglycemic agents.  There are no other associated or modifying factors.  Past Medical History  Diagnosis Date  . Diabetes mellitus   . Hyperlipidemia   . Hypertension   . Kidney disease   . GERD (gastroesophageal reflux disease)     Past Surgical History  Procedure Date  . Appendectomy   . Cholecystectomy   . Leg surgery   . Strabismus surgery     Family History  Problem Relation Age of Onset  . Diabetes Mother   . Heart attack Mother   . Stroke Mother   . Diabetes Father     History  Substance Use Topics  . Smoking status: Never Smoker   . Smokeless tobacco: Not on file  . Alcohol Use: No      Review of Systems  Constitutional: Positive for fatigue.  HENT: Negative.   Eyes: Negative.   Respiratory: Negative.   Cardiovascular: Negative.   Gastrointestinal: Positive for nausea and vomiting.  Genitourinary: Negative.   Musculoskeletal: Positive for  arthralgias.  Skin: Negative.   Neurological: Positive for light-headedness.  Hematological: Negative.   Psychiatric/Behavioral: Negative.   All other systems reviewed and are negative.    Allergies  Codeine  Home Medications   Current Outpatient Rx  Name Route Sig Dispense Refill  . ALBUTEROL SULFATE HFA 108 (90 BASE) MCG/ACT IN AERS Inhalation Inhale 2 puffs into the lungs every 6 (six) hours as needed for wheezing. 1 Inhaler 2  . ASPIRIN 81 MG PO TABS Oral Take 81 mg by mouth daily.     . BUPROPION HCL ER (SR) 100 MG PO TB12 Oral Take 100 mg by mouth 2 (two) times daily.    Marland Kitchen DIVALPROEX SODIUM ER 500 MG PO TB24 Oral Take 1,000 mg by mouth daily.    . IBUPROFEN 200 MG PO TABS Oral Take 800 mg by mouth every 8 (eight) hours as needed. Patient used this medication for headache and body aches.    . INSULIN ASPART 100 UNIT/ML [place] SOLN Subcutaneous Inject 10-20 Units into the skin 3 (three) times daily before meals.    . INSULIN GLARGINE 100 UNIT/ML  SOLN Subcutaneous Inject 80 Units into the skin at bedtime.     Marland Kitchen LISINOPRIL 20 MG PO TABS Oral Take 20 mg by mouth daily.      Marland Kitchen METOPROLOL SUCCINATE ER 25 MG PO TB24 Oral  Take 25 mg by mouth 2 (two) times daily.     Marland Kitchen PRAVASTATIN SODIUM 40 MG PO TABS Oral Take 40 mg by mouth at bedtime.    Marland Kitchen TADALAFIL 5 MG PO TABS Oral Take 5 mg by mouth daily as needed. E.d.    Marland Kitchen TRAZODONE HCL 100 MG PO TABS Oral Take 100 mg by mouth at bedtime.      BP 136/99  Pulse 97  Temp(Src) 97.7 F (36.5 C) (Oral)  Resp 15  Ht 5\' 5"  (1.651 m)  Wt 220 lb (99.791 kg)  BMI 36.61 kg/m2  SpO2 93%  Physical Exam  Nursing note and vitals reviewed. GEN: Well-developed, well-nourished male in no distress HEENT: Atraumatic, normocephalic. Oropharynx clear without erythema EYES: PERRLA BL, no scleral icterus. NECK: Trachea midline, no meningismus CV: regular rate and rhythm. No murmurs, rubs, or gallops PULM: No respiratory distress.  No crackles, wheezes, or  rales. GI: soft, non-tender. No guarding, rebound, or tenderness. + bowel sounds  GU: deferred Neuro: cranial nerves 2-12 intact, no abnormalities of strength or sensation, A and O x 3 MSK: Patient moves all 4 extremities symmetrically, no deformity, edema, or injury noted Skin: No rashes petechiae, purpura, or jaundice Psych: no abnormality of mood   ED Course  Procedures (including critical care time)  Labs Reviewed  GLUCOSE, CAPILLARY - Abnormal; Notable for the following:    Glucose-Capillary 103 (*)    All other components within normal limits  CBC - Abnormal; Notable for the following:    WBC 16.1 (*)    All other components within normal limits  DIFFERENTIAL - Abnormal; Notable for the following:    Neutro Abs 9.7 (*)    Lymphs Abs 5.0 (*)    Monocytes Absolute 1.2 (*)    All other components within normal limits  COMPREHENSIVE METABOLIC PANEL - Abnormal; Notable for the following:    Potassium 3.1 (*)    Glucose, Bld 42 (*)    Total Bilirubin 0.2 (*)    All other components within normal limits  GLUCOSE, CAPILLARY - Abnormal; Notable for the following:    Glucose-Capillary 41 (*)    All other components within normal limits  GLUCOSE, CAPILLARY - Abnormal; Notable for the following:    Glucose-Capillary 115 (*)    All other components within normal limits  LIPASE, BLOOD  LACTIC ACID, PLASMA  URINALYSIS, ROUTINE W REFLEX MICROSCOPIC  GLUCOSE, CAPILLARY   Dg Chest 2 View  11/21/2011  *RADIOLOGY REPORT*  Clinical Data: Hypoglycemia, hypertension. Pericarditis history.  CHEST - 2 VIEW  Comparison: 11/01/2011, 09/18/2011 CT.  Findings: Prominent cardiomediastinal contours. No focal consolidation, pleural effusion, or pneumothorax.  No acute osseous abnormality.  IMPRESSION: Prominent cardiomediastinal contours in this patient with known mediastinal lipomatosis. Cannot exclude superimposed pericardial effusion.  Original Report Authenticated By: Waneta Martins, M.D.      1. Hypoglycemia       MDM  Patient was evaluated by myself and was given orange juice for hypoglycemia.  Work-up for possible infectious causes was negative.  Patient had glucose increase to 103.  He then had decrease to 70s.  Patient was started on D51/2NS at 125 cc/hr.  D/w hospitalist.  Admitted for further eval.  Of note, on hospitalist review of records it was noted that patient has prior admissions both psychiatrically and medically for this complaint where he was found to have given exogenous insulin with same presentation as today.        Cyndra Numbers, MD 11/23/11  0817 

## 2011-11-22 NOTE — Progress Notes (Signed)
Pt. Received per cart from ER.  Alert, oriented.  Ambulated from ER cart to bed. Pt. Oriented to room, unit.  IV continued per pump infusing per right hand site.   VS checked, safety video viewed.  Pt. Told that Blood sugars will need to be checked every hour.  Pt. Asked for, was given saltine crackers and diet drink. Lester Kinsman Lonia Mad, RN

## 2011-11-22 NOTE — Progress Notes (Signed)
DC to home. No change from AM assessment. Pt ambulated from floor per his preferance. To go to Dr.Ellison's office for appointment today at 3 or 345pm for follow up.Hartley Barefoot

## 2011-11-22 NOTE — Patient Instructions (Addendum)
good diet and exercise habits significanly improve the control of your diabetes.  please let me know if you wish to be referred to a dietician.  high blood sugar is very risky to your health.  you should see an eye doctor every year. controlling your blood pressure and cholesterol drastically reduces the damage diabetes does to your body.  this also applies to quitting smoking.  please discuss these with your doctor.  you should take an aspirin every day, unless you have been advised by a doctor not to. check your blood sugar 3 times a day.  vary the time of day when you check, between before the 3 meals, and at bedtime.  also check if you have symptoms of your blood sugar being too high or too low.  please keep a record of the readings and bring it to your next appointment here.  please call us sooner if your blood sugar goes below 70, or if it stays over 200. For now, take lantus 60 units daily, and: Take the as-needed novolog as listed below. Please come back for a follow-up appointment in 5-7 days.

## 2011-11-22 NOTE — Progress Notes (Signed)
Inpatient Diabetes Program Recommendations  AACE/ADA: New Consensus Statement on Inpatient Glycemic Control (2009)  Target Ranges:  Prepandial:   less than 140 mg/dL      Peak postprandial:   less than 180 mg/dL (1-2 hours)      Critically ill patients:  140 - 180 mg/dL   Reason for Visit: Hypoglycemia  Results for DEVELLE, SIEVERS (MRN [id number]) as of 11/22/2011 12:30  Ref. Range 05/07/2008 05:20  Hemoglobin A1C No range found 10.0... (H)  Results for FAHD, GALEA (MRN [id number]) as of 11/22/2011 12:30  Ref. Range 11/02/2011 03:44 11/02/2011 04:16 11/02/2011 07:50 11/02/2011 12:02 11/02/2011 16:44 11/02/2011 20:22 11/03/2011 00:09 11/03/2011 04:11 11/03/2011 07:52 11/03/2011 11:40 11/21/2011 20:05 11/21/2011 21:42 11/21/2011 22:34 11/21/2011 23:31 11/22/2011 02:02 11/22/2011 02:55 11/22/2011 04:08 11/22/2011 06:13 11/22/2011 07:30 11/22/2011 08:53 11/22/2011 09:56 11/22/2011 10:51 11/22/2011 11:52  Glucose-Capillary Latest Range: 70-99 mg/dL 68 (L) 782 (H) 956 (H) 186 (H) 208 (H) 258 (H) 208 (H) 149 (H) 155 (H) 192 (H) 103 (H) 41 (LL) 115 (H) 76 114 (H) 170 (H) 179 (H) 182 (H) 186 (H) 233 (H) 248 (H) 242 (H) 258 (H)   C-peptide tomorrow am. Home meds:  Lantus 80 QHS, Novolog S/S  Inpatient Diabetes Program Recommendations HgbA1C: To assess glycemic control prior to hospitalization - Last HgbA1C - 05/07/2008 - 10.% Diet: CHO-mod medium diet  Note: Will need at least 1/2 of Lantus dose tonight.
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
## 2011-11-22 NOTE — Discharge Summary (Signed)
Discharge Summary  BASEM YANNUZZI MR#: [id number]  DOB:[birth year]  Date of Admission: 11/21/2011 Date of Discharge: 11/22/2011  Patient's PCP: Colette Ribas, MD, MD  Attending Physician:Arlys Scatena  Consults: None  Discharge Diagnoses: Principal Problem:  *Type 1 diabetes mellitus with hypoglycemia Active Problems:  HYPERTENSION   Brief Admitting History and Physical 46 year old male with history of type 1 diabetes, hypertension, hyperlipidemia, major depressive disorder and prior episodes of intentional overdose of insulins, presented to the emergency department with recurrent hypoglycemia. He denied intentional overdose of insulins. He was admitted for further observation, evaluation and management.  Discharge Medications Current Discharge Medication List    CONTINUE these medications which have CHANGED   Details  insulin aspart (NOVOLOG) 100 UNIT/ML injection 0-15 Units, Subcutaneous, 3 times daily with meals, CBG < 70: eat or drink something sweet and recheck blood sugar, CBG 70 - 120: 0 units, CBG 121 - 150: 2 units, CBG 151 - 200: 3 units, CBG 201 - 250: 5 units, CBG 251 - 300: 8 units, CBG 301 - 350: 11 units  CBG 351 - 400: 15 units, CBG > 400: call MD.    insulin glargine (LANTUS) 100 UNIT/ML injection Inject 60 Units into the skin at bedtime.      CONTINUE these medications which have NOT CHANGED   Details  albuterol (PROVENTIL HFA;VENTOLIN HFA) 108 (90 BASE) MCG/ACT inhaler Inhale 2 puffs into the lungs every 6 (six) hours as needed for wheezing. Qty: 1 Inhaler, Refills: 2    aspirin 81 MG tablet Take 81 mg by mouth daily.     buPROPion (WELLBUTRIN SR) 100 MG 12 hr tablet Take 100 mg by mouth 2 (two) times daily.    divalproex (DEPAKOTE ER) 500 MG 24 hr tablet Take 1,000 mg by mouth daily.    ibuprofen (ADVIL,MOTRIN) 200 MG tablet Take 800 mg by mouth every 8 (eight) hours as needed. Patient used this medication for headache and body aches.    lisinopril  (PRINIVIL,ZESTRIL) 20 MG tablet Take 20 mg by mouth daily.      metoprolol succinate (TOPROL-XL) 25 MG 24 hr tablet Take 25 mg by mouth 2 (two) times daily.     pravastatin (PRAVACHOL) 40 MG tablet Take 40 mg by mouth at bedtime.    tadalafil (CIALIS) 5 MG tablet Take 5 mg by mouth daily as needed. E.d.    traZODone (DESYREL) 100 MG tablet Take 100 mg by mouth at bedtime.        Hospital Course: Type 1 diabetes mellitus with hypoglycemia Present on Admission:  .Type 2 diabetes mellitus with hypoglycemia .HYPERTENSION .Type 1 diabetes mellitus with hypoglycemia   1. Type 1 diabetes mellitus with recurrent hypoglycemia episodes: Discussed in detail with patient. He indicates that he has been on 80 units of Lantus at bedtime and NovoLog sliding scale insulin at the current dose for approximately a year. He claims he takes anywhere between 10-20 units NovoLog 3 times a day with meals based on his blood sugars. He cannot remember his recent hemoglobin A1c. He last saw endocrinologist a year ago. He was taken off Byetta due to intolerance. He indicates that his fasting morning blood sugars are usually high ranging from 250-300 mg/dL. Approximately 2 weeks ago he had an episode of hypoglycemia at approximately 9 AM after eating breakfast. 2 days ago he had another episode of symptomatic hypoglycemia at 35 mg/dL at approximately 2 PM (after taking a nap after lunch). He denies any change in his food habits. He was admitted  to the hospital. His insulins were held. He was placed on D5 water. His CBG was 41 mg/dL last night. Since then his blood sugars are steadily crept up and are in the 250s. It appears as though his NovoLog insulin is causing him hypoglycemic episodes. Given his normal C peptide, it does not seem to be secondary to intentional overdose of insulins. Reduced Lantus to 60 units at bedtime and reordered a moderate sensitivity sliding scale. Advised the patient that these insulins will need  further adjustment based on his blood sugars. Also recommended endocrinology followup. Arranged for patient to be seen by an endocrinologist post discharge this afternoon and patient is willing to go there. Further recommendations can be made by the endocrinologist. Patient is well educated to monitor and manage hypoglycemic symptoms. 2. Hypertension: Reasonable control. Continue home medications. 3. Hypokalemia: Repleted. 4. History of hyperlipidemia: Continue statins. 5. History of gastroesophageal reflux disease: Asymptomatic. 6. History of major depressive disorder: Patient denies any suicidal or homicidal ideations or delusions or hallucinations. Continue home medications.  Day of Discharge  Complaints:  Patient denies complaints. He denies any chest pain or dyspnea or nausea vomiting or dizziness.  Physical exam: BP 133/90  Pulse 89  Temp(Src) 98.5 F (36.9 C) (Oral)  Resp 18  Ht 5\' 5"  (1.651 m)  Wt 99.066 kg (218 lb 6.4 oz)  BMI 36.34 kg/m2  SpO2 95%  General exam: Comfortable and sitting at edge of bed. Respiratory system: Clear. No increased work of breathing. Cardiovascular system: First and second heart sounds heard, regular. No JVD or murmurs or pedal edema. Gastrointestinal system: Abdomen is nondistended, soft and normal bowel sounds heard. Nontender. Central nervous system: Alert and oriented. No focal neurological deficits. Extremities: Symmetric 5 x 5 power.  Basic Metabolic Panel:  Lab 11/22/11 7829 11/21/11 2146  NA 135 137  K 3.8 3.1*  CL 99 100  CO2 23 24  GLUCOSE 174* 42*  BUN 12 13  CREATININE 0.90 0.85  CALCIUM 9.2 10.2  ALB -- --  PHOS -- --   Liver Function Tests:  Lab 11/21/11 2146  AST 15  ALT 26  ALKPHOS 91  BILITOT 0.2*  PROT 8.1  ALBUMIN 3.9    Lab 11/21/11 2146  LIPASE 16  AMYLASE --   No results found for this basename: AMMONIA:3 in the last 168 hours CBC:  Lab 11/22/11 0415 11/21/11 2146  WBC 13.0* 16.1*  NEUTROABS --  9.7*  HGB 14.5 16.4  HCT 42.6 46.0  MCV 90.1 89.0  PLT 308 342   Cardiac Enzymes: No results found for this basename: CKTOTAL:5,CKMB:5,CKMBINDEX:5,TROPONINI:5 in the last 168 hours CBG:  Lab 11/22/11 1152 11/22/11 1051 11/22/11 0956 11/22/11 0853 11/22/11 0730  GLUCAP 258* 242* 248* 233* 186*   Other lab data:  1. Venous lack date: 1.6 2. C-peptide: 3.19, within normal limits. 3. Urine analysis is negative for features of urinary tract infection.  Dg Chest 2 View  11/21/2011  *RADIOLOGY REPORT*  Clinical Data: Hypoglycemia, hypertension. Pericarditis history.  CHEST - 2 VIEW  Comparison: 11/01/2011, 09/18/2011 CT.  Findings: Prominent cardiomediastinal contours. No focal consolidation, pleural effusion, or pneumothorax.  No acute osseous abnormality.  IMPRESSION: Prominent cardiomediastinal contours in this patient with known mediastinal lipomatosis. Cannot exclude superimposed pericardial effusion.  Original Report Authenticated By: Waneta Martins, M.D.    Disposition: Discharge home in stable condition.  Diet: Diabetic and heart healthy.  Activity: Increase activity gradually.  Follow-up Appts: Discharge Orders    Future Appointments:  Provider: Department: Dept Phone: Center:   11/22/2011 3:45 PM Romero Belling, MD [facility] [phone] [facility]     Future Orders Please Complete By Expires   Diet - low sodium heart healthy      Diet Carb Modified      Increase activity slowly      Call MD for:  persistant dizziness or light-headedness         TESTS THAT NEED FOLLOW-UP None  Time spent on discharge, talking to the patient, and coordinating care: 30 mins.   SignedMarcellus Scott, MD 11/22/2011, 2:47 PM

## 2011-11-29 ENCOUNTER — Ambulatory Visit (INDEPENDENT_AMBULATORY_CARE_PROVIDER_SITE_OTHER): Payer: Medicare Other | Admitting: Endocrinology

## 2011-11-29 ENCOUNTER — Encounter: Payer: Self-pay | Admitting: Endocrinology

## 2011-11-29 VITALS — BP 138/98 | HR 92 | Temp 97.7°F | Wt 214.0 lb

## 2011-11-29 DIAGNOSIS — E10649 Type 1 diabetes mellitus with hypoglycemia without coma: Secondary | ICD-10-CM

## 2011-11-29 DIAGNOSIS — E1069 Type 1 diabetes mellitus with other specified complication: Secondary | ICD-10-CM

## 2011-11-29 NOTE — Patient Instructions (Addendum)
For now, reduce lantus to 50 units daily, and: Take novolog 15 units with the first and past meals of the day, no matter what your blood sugar is. Do not take extra novolog. Please come back for a follow-up appointment in 2 weeks check your blood sugar twice a day.  vary the time of day when you check, between before the 3 meals, and at bedtime.  also check if you have symptoms of your blood sugar being too high or too low.  please keep a record of the readings and bring it to your next appointment here.  please call us sooner if your blood sugar goes below 70, or if it stays over 200.

## 2011-11-29 NOTE — Progress Notes (Signed)
Subjective:    Patient ID: Levi Meyers, male    DOB: Jul 05, 1966, 46 y.o.   MRN: [id number]  HPI pt returns for f/u of IDDM (dx'ed 2004--no known chronic complications).  He says cbg's vary from 207-300's.  There is no trend throughout the day.  He is uncertain why he had severe hypoglycemia, except for acute resp illness. Wife says this episode and several prior ones were in the afternoon.  In addition to 60/d of lantus, he has taken approx 10 units total per day of novolog.   Past Medical History  Diagnosis Date  . Diabetes mellitus   . Hyperlipidemia   . Hypertension   . Kidney disease   . GERD (gastroesophageal reflux disease)   . History of kidney stones     Past Surgical History  Procedure Date  . Appendectomy   . Cholecystectomy   . Leg surgery   . Strabismus surgery     History   Social History  . Marital Status: Married    Spouse Name: N/A    Number of Children: N/A  . Years of Education: N/A   Occupational History  . Unemployed    Social History Main Topics  . Smoking status: Former Games developer  . Smokeless tobacco: Not on file  . Alcohol Use: No  . Drug Use: No  . Sexually Active:    Other Topics Concern  . Not on file   Social History Narrative   Regular exercise-yesCaffeine Use-no    Current Outpatient Prescriptions on File Prior to Visit  Medication Sig Dispense Refill  . albuterol (PROVENTIL HFA;VENTOLIN HFA) 108 (90 BASE) MCG/ACT inhaler Inhale 2 puffs into the lungs every 6 (six) hours as needed for wheezing.  1 Inhaler  2  . aspirin 81 MG tablet Take 81 mg by mouth daily.       Marland Kitchen buPROPion (WELLBUTRIN SR) 100 MG 12 hr tablet Take 100 mg by mouth 2 (two) times daily.      . divalproex (DEPAKOTE ER) 500 MG 24 hr tablet Take 1,000 mg by mouth daily.      Marland Kitchen ibuprofen (ADVIL,MOTRIN) 200 MG tablet Take 800 mg by mouth every 8 (eight) hours as needed. Patient used this medication for headache and body aches.      . insulin aspart (NOVOLOG) 100 UNIT/ML  injection Inject 15 Units into the skin 2 (two) times daily before a meal. With the first and last meals of the day      . insulin glargine (LANTUS) 100 UNIT/ML injection Inject 50 Units into the skin at bedtime.      Marland Kitchen lisinopril (PRINIVIL,ZESTRIL) 20 MG tablet Take 20 mg by mouth daily.        . metoprolol succinate (TOPROL-XL) 25 MG 24 hr tablet Take 25 mg by mouth 2 (two) times daily.       . pravastatin (PRAVACHOL) 40 MG tablet Take 40 mg by mouth at bedtime.      . tadalafil (CIALIS) 5 MG tablet Take 5 mg by mouth daily as needed. E.d.      Marland Kitchen traZODone (DESYREL) 100 MG tablet Take 100 mg by mouth at bedtime.        Allergies  Allergen Reactions  . Codeine Nausea And Vomiting    Family History  Problem Relation Age of Onset  . Diabetes Mother   . Heart attack Mother   . Stroke Mother   . Diabetes Father   . Heart disease Other     Parent  .  Heart disease Other     Grandparents    BP 138/98  Pulse 92  Temp(Src) 97.7 F (36.5 C) (Oral)  Wt 214 lb (97.07 kg)  SpO2 96%  Review of Systems denies hypoglycemia    Objective:   Physical Exam VITAL SIGNS:  See vs page GENERAL: no distress SKIN:  Insulin injection sites at the anterior abdomen are normal.     Assessment & Plan:  DM.  The pattern of his cbg's indicates he needs some adjustment in his therapy

## 2011-12-03 ENCOUNTER — Encounter: Payer: Self-pay | Admitting: Endocrinology

## 2011-12-03 ENCOUNTER — Ambulatory Visit (INDEPENDENT_AMBULATORY_CARE_PROVIDER_SITE_OTHER): Payer: Medicare Other | Admitting: Endocrinology

## 2011-12-03 VITALS — BP 122/88 | HR 86 | Temp 97.8°F | Ht 65.0 in | Wt 218.0 lb

## 2011-12-03 DIAGNOSIS — E1069 Type 1 diabetes mellitus with other specified complication: Secondary | ICD-10-CM

## 2011-12-03 DIAGNOSIS — E10649 Type 1 diabetes mellitus with hypoglycemia without coma: Secondary | ICD-10-CM

## 2011-12-03 MED ORDER — INSULIN ASPART 100 UNIT/ML ~~LOC~~ SOLN: SUBCUTANEOUS | Status: DC

## 2011-12-03 NOTE — Patient Instructions (Addendum)
For now, continue lantus 50 units daily, and: increase novolog to 3x a day (just before each meal), 30-10-30 units, no matter what your blood sugar is. Please come back for a follow-up appointment next week. check your blood sugar twice a day.  vary the time of day when you check, between before the 3 meals, and at bedtime.  also check if you have symptoms of your blood sugar being too high or too low.  please keep a record of the readings and bring it to your next appointment here.  please call us sooner if your blood sugar goes below 70, or if it stays over 200.  Drink plenty of fluids.  This helps keep your blood sugar down.

## 2011-12-03 NOTE — Progress Notes (Signed)
Subjective:    Patient ID: Levi Meyers, male    DOB: October 05, 1965, 46 y.o.   MRN: [id number]  HPI pt returns for f/u of IDDM (dx'ed 2004--no known chronic complications).  Since last oc, cbg's have been 300-400's.  There is no trend throughout the day.  He is uncertain why this is.   Past Medical History  Diagnosis Date  . Diabetes mellitus   . Hyperlipidemia   . Hypertension   . Kidney disease   . GERD (gastroesophageal reflux disease)   . History of kidney stones     Past Surgical History  Procedure Date  . Appendectomy   . Cholecystectomy   . Leg surgery   . Strabismus surgery     History   Social History  . Marital Status: Married    Spouse Name: N/A    Number of Children: N/A  . Years of Education: N/A   Occupational History  . Unemployed    Social History Main Topics  . Smoking status: Former Games developer  . Smokeless tobacco: Not on file  . Alcohol Use: No  . Drug Use: No  . Sexually Active:    Other Topics Concern  . Not on file   Social History Narrative   Regular exercise-yesCaffeine Use-no    Current Outpatient Prescriptions on File Prior to Visit  Medication Sig Dispense Refill  . albuterol (PROVENTIL HFA;VENTOLIN HFA) 108 (90 BASE) MCG/ACT inhaler Inhale 2 puffs into the lungs every 6 (six) hours as needed for wheezing.  1 Inhaler  2  . aspirin 81 MG tablet Take 81 mg by mouth daily.       Marland Kitchen buPROPion (WELLBUTRIN SR) 100 MG 12 hr tablet Take 100 mg by mouth 2 (two) times daily.      . divalproex (DEPAKOTE ER) 500 MG 24 hr tablet Take 1,000 mg by mouth daily.      Marland Kitchen ibuprofen (ADVIL,MOTRIN) 200 MG tablet Take 800 mg by mouth every 8 (eight) hours as needed. Patient used this medication for headache and body aches.      . insulin glargine (LANTUS) 100 UNIT/ML injection Inject 50 Units into the skin at bedtime.      Marland Kitchen lisinopril (PRINIVIL,ZESTRIL) 20 MG tablet Take 20 mg by mouth daily.        . metoprolol succinate (TOPROL-XL) 25 MG 24 hr tablet Take 25  mg by mouth 2 (two) times daily.       . pravastatin (PRAVACHOL) 40 MG tablet Take 40 mg by mouth at bedtime.      . tadalafil (CIALIS) 5 MG tablet Take 5 mg by mouth daily as needed. E.d.      Marland Kitchen traZODone (DESYREL) 100 MG tablet Take 100 mg by mouth at bedtime.        Allergies  Allergen Reactions  . Codeine Nausea And Vomiting    Family History  Problem Relation Age of Onset  . Diabetes Mother   . Heart attack Mother   . Stroke Mother   . Diabetes Father   . Heart disease Other     Parent  . Heart disease Other     Grandparents    BP 122/88  Pulse 86  Temp(Src) 97.8 F (36.6 C) (Oral)  Ht 5\' 5"  (1.651 m)  Wt 218 lb (98.884 kg)  BMI 36.28 kg/m2  SpO2 95%    Review of Systems denies hypoglycemia, n/v    Objective:   Physical Exam VITAL SIGNS:  See vs page GENERAL: no distress  PSYCH: Alert and oriented x 3.  Does not appear anxious nor depressed.       Assessment & Plan:  DM.  needs increased rx

## 2011-12-06 ENCOUNTER — Ambulatory Visit: Payer: Medicare Other | Admitting: Endocrinology

## 2011-12-13 ENCOUNTER — Encounter: Payer: Self-pay | Admitting: Endocrinology

## 2011-12-13 ENCOUNTER — Ambulatory Visit (INDEPENDENT_AMBULATORY_CARE_PROVIDER_SITE_OTHER): Payer: Medicare Other | Admitting: Endocrinology

## 2011-12-13 VITALS — BP 110/82 | HR 104 | Temp 98.6°F | Ht 65.0 in | Wt 209.0 lb

## 2011-12-13 DIAGNOSIS — E109 Type 1 diabetes mellitus without complications: Secondary | ICD-10-CM

## 2011-12-13 NOTE — Patient Instructions (Addendum)
For now, increase lantus to 60 units daily, and: increase novolog to 3x a day (just before each meal), 40-10-40 units, no matter what your blood sugar is. Please come back for a follow-up appointment in 2 weeks. check your blood sugar twice a day.  vary the time of day when you check, between before the 3 meals, and at bedtime.  also check if you have symptoms of your blood sugar being too high or too low.  please keep a record of the readings and bring it to your next appointment here.  please call us sooner if your blood sugar goes below 70, or if it stays over 200.

## 2011-12-13 NOTE — Progress Notes (Signed)
  Subjective:    Patient ID: Levi Meyers, male    DOB: Mar 03, 1966, 46 y.o.   MRN: [id number]  HPI pt returns for f/u of IDDM (dx'ed 2004--no known chronic complications).  He continues to have severe hyperglycemia.  He was seen in ER in [place] 3 days ago.  He was given IVF.  There is no trend throughout the day.  He says his current 120 units total per day is the most he has ever been on.  He has fatigue, but no n/v.   Review of Systems denies hypoglycemia.    Objective:   Physical Exam VITAL SIGNS:  See vs page GENERAL: no distress Gait: normal and steady.         Assessment & Plan:

## 2011-12-31 ENCOUNTER — Ambulatory Visit: Payer: Medicare Other | Admitting: Endocrinology

## 2012-01-17 ENCOUNTER — Encounter: Payer: Self-pay | Admitting: Endocrinology

## 2012-01-17 ENCOUNTER — Ambulatory Visit (INDEPENDENT_AMBULATORY_CARE_PROVIDER_SITE_OTHER): Payer: Medicare Other | Admitting: Endocrinology

## 2012-01-17 VITALS — BP 132/96 | HR 107 | Temp 97.3°F | Ht 65.0 in | Wt 201.0 lb

## 2012-01-17 DIAGNOSIS — E10649 Type 1 diabetes mellitus with hypoglycemia without coma: Secondary | ICD-10-CM

## 2012-01-17 DIAGNOSIS — E1069 Type 1 diabetes mellitus with other specified complication: Secondary | ICD-10-CM

## 2012-01-17 MED ORDER — INSULIN ASPART 100 UNIT/ML ~~LOC~~ SOLN: SUBCUTANEOUS | Status: DC

## 2012-01-17 NOTE — Patient Instructions (Addendum)
For now, increase lantus to 70 units daily, and: increase novolog to 3x a day (just before each meal), 50-20-50 units, no matter what your blood sugar is. Please come back for a follow-up appointment in 2-4 weeks. check your blood sugar twice a day.  vary the time of day when you check, between before the 3 meals, and at bedtime.  also check if you have symptoms of your blood sugar being too high or too low.  please keep a record of the readings and bring it to your next appointment here.  please call us sooner if your blood sugar goes below 70, or if it stays over 200.   Drink plenty of fluids

## 2012-01-17 NOTE — Progress Notes (Signed)
Subjective:    Patient ID: Levi Meyers, male    DOB: Sep 19, 1965, 46 y.o.   MRN: [id number]  Diabetes  pt returns for f/u of IDDM (dx'ed 2004--no known chronic complications).  He continues to have severe hyperglycemia.  It varies from 300-495 this morning.  He continues to lose weight.  He has polyuria and polydipsia.  He is very discouraged about the ongoing hyperglycemia.   Past Medical History  Diagnosis Date  . Diabetes mellitus   . Hyperlipidemia   . Hypertension   . Kidney disease   . GERD (gastroesophageal reflux disease)   . History of kidney stones     Past Surgical History  Procedure Date  . Appendectomy   . Cholecystectomy   . Leg surgery   . Strabismus surgery     History   Social History  . Marital Status: Married    Spouse Name: N/A    Number of Children: N/A  . Years of Education: N/A   Occupational History  . Unemployed    Social History Main Topics  . Smoking status: Former Games developer  . Smokeless tobacco: Not on file  . Alcohol Use: No  . Drug Use: No  . Sexually Active:    Other Topics Concern  . Not on file   Social History Narrative   Regular exercise-yesCaffeine Use-no    Current Outpatient Prescriptions on File Prior to Visit  Medication Sig Dispense Refill  . albuterol (PROVENTIL HFA;VENTOLIN HFA) 108 (90 BASE) MCG/ACT inhaler Inhale 2 puffs into the lungs every 6 (six) hours as needed for wheezing.  1 Inhaler  2  . aspirin 81 MG tablet Take 81 mg by mouth daily.       Marland Kitchen buPROPion (WELLBUTRIN SR) 100 MG 12 hr tablet Take 100 mg by mouth 2 (two) times daily.      . divalproex (DEPAKOTE ER) 500 MG 24 hr tablet Take 1,000 mg by mouth daily.      Marland Kitchen ibuprofen (ADVIL,MOTRIN) 200 MG tablet Take 800 mg by mouth every 8 (eight) hours as needed. Patient used this medication for headache and body aches.      . insulin glargine (LANTUS) 100 UNIT/ML injection Inject 70 Units into the skin at bedtime.       Marland Kitchen lisinopril (PRINIVIL,ZESTRIL) 20 MG tablet  Take 20 mg by mouth daily.        . metoprolol succinate (TOPROL-XL) 25 MG 24 hr tablet Take 25 mg by mouth 2 (two) times daily.       . pravastatin (PRAVACHOL) 40 MG tablet Take 40 mg by mouth at bedtime.      . tadalafil (CIALIS) 5 MG tablet Take 5 mg by mouth daily as needed. E.d.      Marland Kitchen traZODone (DESYREL) 100 MG tablet Take 100 mg by mouth at bedtime.        Allergies  Allergen Reactions  . Codeine Nausea And Vomiting  . Victoza (Liraglutide) Nausea And Vomiting    Family History  Problem Relation Age of Onset  . Diabetes Mother   . Heart attack Mother   . Stroke Mother   . Diabetes Father   . Heart disease Other     Parent  . Heart disease Other     Grandparents    BP 132/96  Pulse 107  Temp 97.3 F (36.3 C) (Oral)  Ht 5\' 5"  (1.651 m)  Wt 201 lb (91.173 kg)  BMI 33.45 kg/m2  SpO2 95%   Review of Systems  denies hypoglycemia.    Objective:   Physical Exam VITAL SIGNS:  See vs page GENERAL: no distress Gait: normal and steady.       Assessment & Plan:  DM.  needs increased rx

## 2012-02-14 ENCOUNTER — Ambulatory Visit (INDEPENDENT_AMBULATORY_CARE_PROVIDER_SITE_OTHER): Payer: Medicare Other | Admitting: Endocrinology

## 2012-02-14 ENCOUNTER — Encounter: Payer: Self-pay | Admitting: Endocrinology

## 2012-02-14 VITALS — BP 132/88 | HR 99 | Temp 97.8°F | Ht 65.0 in | Wt 207.0 lb

## 2012-02-14 DIAGNOSIS — E1069 Type 1 diabetes mellitus with other specified complication: Secondary | ICD-10-CM

## 2012-02-14 DIAGNOSIS — E10649 Type 1 diabetes mellitus with hypoglycemia without coma: Secondary | ICD-10-CM

## 2012-02-14 NOTE — Patient Instructions (Addendum)
For now, increase lantus to 80 units daily, and: increase novolog to 3x a day (just before each meal), 50-0-60 units, no matter what your blood sugar is. Please come back for a follow-up appointment in 6 weeks.   check your blood sugar twice a day.  vary the time of day when you check, between before the 3 meals, and at bedtime.  also check if you have symptoms of your blood sugar being too high or too low.  please keep a record of the readings and bring it to your next appointment here.  please call us sooner if your blood sugar goes below 70, or if it stays over 200.
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
## 2012-02-26 ENCOUNTER — Encounter (HOSPITAL_BASED_OUTPATIENT_CLINIC_OR_DEPARTMENT_OTHER): Payer: Self-pay | Admitting: Emergency Medicine

## 2012-02-26 ENCOUNTER — Emergency Department (HOSPITAL_BASED_OUTPATIENT_CLINIC_OR_DEPARTMENT_OTHER)
Admission: EM | Admit: 2012-02-26 | Discharge: 2012-02-27 | Disposition: A | Discharge status: Home / Self Care | Payer: Medicare Other | Attending: Emergency Medicine | Admitting: Emergency Medicine

## 2012-02-26 DIAGNOSIS — E119 Type 2 diabetes mellitus without complications: Secondary | ICD-10-CM | POA: Insufficient documentation

## 2012-02-26 DIAGNOSIS — Z794 Long term (current) use of insulin: Secondary | ICD-10-CM | POA: Insufficient documentation

## 2012-02-26 DIAGNOSIS — I10 Essential (primary) hypertension: Secondary | ICD-10-CM | POA: Insufficient documentation

## 2012-02-26 DIAGNOSIS — Z87891 Personal history of nicotine dependence: Secondary | ICD-10-CM | POA: Insufficient documentation

## 2012-02-26 DIAGNOSIS — R109 Unspecified abdominal pain: Secondary | ICD-10-CM

## 2012-02-26 DIAGNOSIS — Z79899 Other long term (current) drug therapy: Secondary | ICD-10-CM | POA: Insufficient documentation

## 2012-02-26 DIAGNOSIS — K219 Gastro-esophageal reflux disease without esophagitis: Secondary | ICD-10-CM | POA: Insufficient documentation

## 2012-02-26 DIAGNOSIS — E785 Hyperlipidemia, unspecified: Secondary | ICD-10-CM | POA: Insufficient documentation

## 2012-02-26 LAB — URINALYSIS, ROUTINE W REFLEX MICROSCOPIC
Glucose, UA: >1000 mg/dL — AB
Hgb urine dipstick: NEGATIVE
Ketones, ur: NEGATIVE mg/dL
Protein, ur: NEGATIVE mg/dL

## 2012-02-26 MED ORDER — KETOROLAC TROMETHAMINE 30 MG/ML IJ SOLN
30.0000 mg | Freq: Once | INTRAMUSCULAR | Status: AC
  Administered 2012-02-26: 30 mg via INTRAVENOUS
  Filled 2012-02-26: qty 1

## 2012-02-26 MED ORDER — PROMETHAZINE HCL 25 MG PO TABS: 25.0000 mg | ORAL_TABLET | Freq: Four times a day (QID) | ORAL | Status: DC | PRN

## 2012-02-26 MED ORDER — HYDROCODONE-ACETAMINOPHEN 5-500 MG PO TABS: 1.0000 | ORAL_TABLET | Freq: Four times a day (QID) | ORAL | Status: AC | PRN

## 2012-02-26 MED ORDER — IBUPROFEN 800 MG PO TABS: 800.0000 mg | ORAL_TABLET | Freq: Three times a day (TID) | ORAL | Status: AC

## 2012-02-26 MED ORDER — TAMSULOSIN HCL 0.4 MG PO CAPS: 0.4000 mg | ORAL_CAPSULE | Freq: Two times a day (BID) | ORAL | Status: DC

## 2012-02-26 MED ORDER — ONDANSETRON HCL 4 MG/2ML IJ SOLN
4.0000 mg | Freq: Once | INTRAMUSCULAR | Status: AC
  Administered 2012-02-26: 4 mg via INTRAVENOUS
  Filled 2012-02-26: qty 2

## 2012-02-26 MED ORDER — SODIUM CHLORIDE 0.9 % IV BOLUS (SEPSIS)
1000.0000 mL | Freq: Once | INTRAVENOUS | Status: AC
  Administered 2012-02-26: 1000 mL via INTRAVENOUS

## 2012-02-26 MED ORDER — HYDROMORPHONE HCL PF 1 MG/ML IJ SOLN
1.0000 mg | Freq: Once | INTRAMUSCULAR | Status: AC
  Administered 2012-02-26: 1 mg via INTRAVENOUS
  Filled 2012-02-26: qty 1

## 2012-02-26 NOTE — ED Provider Notes (Signed)
I inquired with the patient about his hyperglycemia and the fact that he had sugar in his urine. He states that he takes his medication as he should, it has been in the 250 range today and he will take his medication when he gets home. He appears stable for discharge  Vida Roller, MD 02/26/12 2343

## 2012-02-26 NOTE — ED Notes (Signed)
Pt c/o left flank pain since this am. Pt states pain is similar to previous kidney stones

## 2012-02-26 NOTE — ED Provider Notes (Signed)
History     CSN: [id number]  Arrival date & time 02/26/12  2151   First MD Initiated Contact with Patient 02/26/12 2157      Chief Complaint  Patient presents with  . Flank Pain    (Consider location/radiation/quality/duration/timing/severity/associated sxs/prior treatment) HPI Comments: 46 year old male with a history of recurrent kidney stones, diabetes hypertension hypercholesterolemia presents with a complaint of left flank pain which is sharp and stabbing, intermittent, onset was this a.m., it has been fluctuating throughout the day. He has associated nausea and vomiting but denies any changes in the color of his urine. The symptoms are severe at times, no medicine prior to arrival. He has required urologic tract manipulation in the past to facilitate passage of stones. This was not since several years  Patient is a 46 y.o. male presenting with flank pain.  Flank Pain    Past Medical History  Diagnosis Date  . Diabetes mellitus   . Hyperlipidemia   . Hypertension   . Kidney disease   . GERD (gastroesophageal reflux disease)   . History of kidney stones     Past Surgical History  Procedure Date  . Appendectomy   . Cholecystectomy   . Leg surgery   . Strabismus surgery     Family History  Problem Relation Age of Onset  . Diabetes Mother   . Heart attack Mother   . Stroke Mother   . Diabetes Father   . Heart disease Other     Parent  . Heart disease Other     Grandparents    History  Substance Use Topics  . Smoking status: Former Games developer  . Smokeless tobacco: Not on file  . Alcohol Use: No      Review of Systems  Genitourinary: Positive for flank pain.  All other systems reviewed and are negative.    Allergies  Codeine and Victoza  Home Medications   Current Outpatient Rx  Name Route Sig Dispense Refill  . ASPIRIN 81 MG PO TABS Oral Take 81 mg by mouth daily.     . BUPROPION HCL ER (SR) 100 MG PO TB12 Oral Take 100 mg by mouth 2 (two) times  daily.    Marland Kitchen DIVALPROEX SODIUM ER 500 MG PO TB24 Oral Take 1,000 mg by mouth daily. Anti-seizure medication.  Consult needed.    . INSULIN ASPART 100 UNIT/ML [place] SOLN Subcutaneous Inject 50 Units into the skin 3 (three) times daily before meals. 50 units with breakfast, none with lunch, and 60 units with the evening meal, and pen needles 4/day    . INSULIN GLARGINE 100 UNIT/ML [place] SOLN Subcutaneous Inject 80 Units into the skin at bedtime.     Marland Kitchen LISINOPRIL 20 MG PO TABS Oral Take 20 mg by mouth daily.     Marland Kitchen METOPROLOL SUCCINATE ER 25 MG PO TB24 Oral Take 25 mg by mouth 2 (two) times daily.     Marland Kitchen PRAVASTATIN SODIUM 40 MG PO TABS Oral Take 40 mg by mouth at bedtime.    . TRAZODONE HCL 100 MG PO TABS Oral Take 100 mg by mouth at bedtime.    Marland Kitchen HYDROCODONE-ACETAMINOPHEN 5-500 MG PO TABS Oral Take 1-2 tablets by mouth every 6 (six) hours as needed for pain. 15 tablet 0  . IBUPROFEN 800 MG PO TABS Oral Take 1 tablet (800 mg total) by mouth 3 (three) times daily. 21 tablet 0  . PROMETHAZINE HCL 25 MG PO TABS Oral Take 1 tablet (25 mg total) by mouth  every 6 (six) hours as needed for nausea. 12 tablet 0  . TAMSULOSIN HCL 0.4 MG PO CAPS Oral Take 1 capsule (0.4 mg total) by mouth 2 (two) times daily. 10 capsule 0    BP 150/98  Pulse 105  Temp 97.5 F (36.4 C) (Oral)  Resp 18  Ht 5\' 5"  (1.651 m)  Wt 207 lb (93.895 kg)  BMI 34.45 kg/m2  SpO2 97%  Physical Exam  Nursing note and vitals reviewed. Constitutional: He appears well-developed and well-nourished.       Uncomfortable appearing  HENT:  Head: Normocephalic and atraumatic.  Mouth/Throat: Oropharynx is clear and moist. No oropharyngeal exudate.  Eyes: Conjunctivae and EOM are normal. Pupils are equal, round, and reactive to light. Right eye exhibits no discharge. Left eye exhibits no discharge. No scleral icterus.  Neck: Normal range of motion. Neck supple. No JVD present. No thyromegaly present.  Cardiovascular: Normal rate, regular rhythm,  normal heart sounds and intact distal pulses.  Exam reveals no gallop and no friction rub.   No murmur heard. Pulmonary/Chest: Effort normal and breath sounds normal. No respiratory distress. He has no wheezes. He has no rales.  Abdominal: Soft. Bowel sounds are normal. He exhibits no distension and no mass. There is no tenderness.       Left CVA tenderness, no abdominal tenderness  Musculoskeletal: Normal range of motion. He exhibits no edema and no tenderness.  Lymphadenopathy:    He has no cervical adenopathy.  Neurological: He is alert. Coordination normal.  Skin: Skin is warm and dry. No rash noted. No erythema.  Psychiatric: He has a normal mood and affect. His behavior is normal.    ED Course  Procedures (including critical care time)  Labs Reviewed  URINALYSIS, ROUTINE W REFLEX MICROSCOPIC - Abnormal; Notable for the following:    Specific Gravity, Urine 1.041 (*)     Glucose, UA >1000 (*)     All other components within normal limits  URINE MICROSCOPIC-ADD ON   No results found.   1. Flank pain       MDM  Vital signs reflect mild tachycardia, the patient is uncomfortable appearing with a history consistent with a kidney stone. At this time he declines a CT scan but agrees to pain medications. Urinalysis pending, IV to be placed.   The pt was given opiate type medications while in the emergency dept.  The patient was instructed on the possible side effects and potential allergic reactions associated with said medications and agreed to their use.  I also instructed the patient not to perform certain activities after use of these medications such as driving a vehicle and performing child care.  They were instructed not to ingest alcohol or other medications that may cause excessive sleepiness, tranquilizers or CNS depressant medications.  They have expressed their understanding.  If the pt was given opiate medications for home by prescription they were reminded of these  precautions as well.  Again I offered the pt CT and he declined - he has no significant findings in the urine and has complete relief of sx with Toradol and Dilaudid with Zofran and Fluid Bolus.  Home with meds and Urology f/u.  Discharge Prescriptions include:  Flomax Vicodin Ibuprofen Phenergan     Vida Roller, MD 02/26/12 2342

## 2012-03-10 ENCOUNTER — Encounter (HOSPITAL_BASED_OUTPATIENT_CLINIC_OR_DEPARTMENT_OTHER): Payer: Self-pay | Admitting: *Deleted

## 2012-03-10 ENCOUNTER — Emergency Department (HOSPITAL_BASED_OUTPATIENT_CLINIC_OR_DEPARTMENT_OTHER): Payer: Medicare Other

## 2012-03-10 ENCOUNTER — Emergency Department (HOSPITAL_BASED_OUTPATIENT_CLINIC_OR_DEPARTMENT_OTHER)
Admission: EM | Admit: 2012-03-10 | Discharge: 2012-03-10 | Disposition: A | Discharge status: Home / Self Care | Payer: Medicare Other | Attending: Emergency Medicine | Admitting: Emergency Medicine

## 2012-03-10 DIAGNOSIS — Z794 Long term (current) use of insulin: Secondary | ICD-10-CM | POA: Insufficient documentation

## 2012-03-10 DIAGNOSIS — E119 Type 2 diabetes mellitus without complications: Secondary | ICD-10-CM | POA: Insufficient documentation

## 2012-03-10 DIAGNOSIS — R109 Unspecified abdominal pain: Secondary | ICD-10-CM | POA: Insufficient documentation

## 2012-03-10 DIAGNOSIS — K7689 Other specified diseases of liver: Secondary | ICD-10-CM | POA: Insufficient documentation

## 2012-03-10 DIAGNOSIS — I10 Essential (primary) hypertension: Secondary | ICD-10-CM | POA: Insufficient documentation

## 2012-03-10 DIAGNOSIS — N200 Calculus of kidney: Secondary | ICD-10-CM

## 2012-03-10 LAB — URINALYSIS, ROUTINE W REFLEX MICROSCOPIC
Bilirubin Urine: NEGATIVE
Glucose, UA: >1000 mg/dL — AB
Protein, ur: NEGATIVE mg/dL

## 2012-03-10 LAB — URINE MICROSCOPIC-ADD ON

## 2012-03-10 MED ORDER — HYDROMORPHONE HCL PF 1 MG/ML IJ SOLN
1.0000 mg | Freq: Once | INTRAMUSCULAR | Status: AC
  Administered 2012-03-10: 1 mg via INTRAVENOUS
  Filled 2012-03-10: qty 1

## 2012-03-10 MED ORDER — OXYCODONE-ACETAMINOPHEN 5-325 MG PO TABS
2.0000 | ORAL_TABLET | ORAL | Status: AC | PRN
Start: 2012-03-10 — End: 2012-03-20

## 2012-03-10 MED ORDER — ONDANSETRON HCL 4 MG/2ML IJ SOLN
4.0000 mg | Freq: Once | INTRAMUSCULAR | Status: AC
  Administered 2012-03-10: 4 mg via INTRAVENOUS
  Filled 2012-03-10: qty 2

## 2012-03-10 MED ORDER — KETOROLAC TROMETHAMINE 30 MG/ML IJ SOLN
30.0000 mg | Freq: Once | INTRAMUSCULAR | Status: AC
  Administered 2012-03-10: 30 mg via INTRAVENOUS
  Filled 2012-03-10: qty 1

## 2012-03-10 NOTE — ED Notes (Signed)
Pt c/o left flank pain seen here on 7th for same

## 2012-03-10 NOTE — ED Provider Notes (Signed)
History     CSN: [id number]  Arrival date & time 03/10/12  1955   First MD Initiated Contact with Patient 03/10/12 2113      Chief Complaint  Patient presents with  . Flank Pain    (Consider location/radiation/quality/duration/timing/severity/associated sxs/prior treatment) Patient is a 46 y.o. male presenting with flank pain. The history is provided by the patient. No language interpreter was used.  Flank Pain This is a new problem. The current episode started 1 to 4 weeks ago. The problem occurs constantly. The problem has been gradually worsening. Associated symptoms include abdominal pain. Nothing aggravates the symptoms. He has tried nothing for the symptoms. The treatment provided moderate relief.  Pt complains of left flank pain. Pt was seen here 8/7 and referred to urology.  Pt reports increasing pain.  Pt did not have a ct scan.  Pt told to return if worsening for scan.  Pt reports he called urology and they can not see him for 3 weeks.  Pt reports he has had multiple procedures for stone obstructions in the past.  Pt reports he has never been able to pass a stone on his own.  Past Medical History  Diagnosis Date  . Diabetes mellitus   . Hyperlipidemia   . Hypertension   . Kidney disease   . GERD (gastroesophageal reflux disease)   . History of kidney stones     Past Surgical History  Procedure Date  . Appendectomy   . Cholecystectomy   . Leg surgery   . Strabismus surgery     Family History  Problem Relation Age of Onset  . Diabetes Mother   . Heart attack Mother   . Stroke Mother   . Diabetes Father   . Heart disease Other     Parent  . Heart disease Other     Grandparents    History  Substance Use Topics  . Smoking status: Former Games developer  . Smokeless tobacco: Not on file  . Alcohol Use: No      Review of Systems  Gastrointestinal: Positive for abdominal pain.  Genitourinary: Positive for flank pain.  All other systems reviewed and are  negative.    Allergies  Codeine and Victoza  Home Medications   Current Outpatient Rx  Name Route Sig Dispense Refill  . ASPIRIN 81 MG PO TABS Oral Take 81 mg by mouth daily.     . BUPROPION HCL ER (SR) 100 MG PO TB12 Oral Take 100 mg by mouth 2 (two) times daily.    Marland Kitchen DIVALPROEX SODIUM ER 500 MG PO TB24 Oral Take 1,000 mg by mouth daily. Anti-seizure medication.  Consult needed.    . INSULIN ASPART 100 UNIT/ML [place] SOLN Subcutaneous Inject 50-60 Units into the skin 3 (three) times daily before meals. 50 units with breakfast, none with lunch, and 60 units with the evening meal, and pen needles 4/day    . INSULIN GLARGINE 100 UNIT/ML [place] SOLN Subcutaneous Inject 80 Units into the skin at bedtime.     Marland Kitchen LISINOPRIL 20 MG PO TABS Oral Take 20 mg by mouth daily.     Marland Kitchen METOPROLOL SUCCINATE ER 25 MG PO TB24 Oral Take 25 mg by mouth 2 (two) times daily.     Marland Kitchen PRAVASTATIN SODIUM 40 MG PO TABS Oral Take 40 mg by mouth at bedtime.    . TAMSULOSIN HCL 0.4 MG PO CAPS Oral Take 0.4 mg by mouth 2 (two) times daily.    . TRAZODONE HCL 100 MG  PO TABS Oral Take 100 mg by mouth at bedtime.    Marland Kitchen PROMETHAZINE HCL 25 MG PO TABS Oral Take 1 tablet (25 mg total) by mouth every 6 (six) hours as needed for nausea. 12 tablet 0    BP 132/89  Pulse 100  Temp 98.3 F (36.8 C) (Oral)  Resp 16  Ht 5\' 5"  (1.651 m)  Wt 207 lb (93.895 kg)  BMI 34.45 kg/m2  SpO2 100%  Physical Exam  Nursing note and vitals reviewed. Constitutional: He is oriented to person, place, and time. He appears well-developed and well-nourished.  HENT:  Head: Normocephalic and atraumatic.  Right Ear: External ear normal.  Left Ear: External ear normal.  Nose: Nose normal.  Mouth/Throat: Oropharynx is clear and moist.  Eyes: Conjunctivae and EOM are normal. Pupils are equal, round, and reactive to light.  Neck: Normal range of motion. Neck supple.  Cardiovascular: Normal rate and normal heart sounds.   Pulmonary/Chest: Effort normal.   Abdominal: Soft. Bowel sounds are normal.  Musculoskeletal: Normal range of motion.  Neurological: He is alert and oriented to person, place, and time. He has normal reflexes.  Skin: Skin is warm.  Psychiatric: He has a normal mood and affect.    ED Course  Procedures (including critical care time)  Labs Reviewed  URINALYSIS, ROUTINE W REFLEX MICROSCOPIC - Abnormal; Notable for the following:    Specific Gravity, Urine 1.040 (*)     Glucose, UA >1000 (*)     Hgb urine dipstick LARGE (*)     Ketones, ur 15 (*)     All other components within normal limits  URINE MICROSCOPIC-ADD ON   No results found.   1. Kidney stone       MDM  IV NS kvo,  Dilaudid and zofran, torodol iv,   Pt feels better,   Ct reviewed,   I suspect passed stone,   Pt advised follow up with urology.  Return if any problems.        Lonia Skinner [place], [place] 03/10/12 2144  Lonia Skinner [place], [place] 03/10/12 2218  Lonia Skinner [place], [place] 03/10/12 2228

## 2012-03-11 NOTE — ED Provider Notes (Signed)
Medical screening examination/treatment/procedure(s) were performed by non-physician practitioner and as supervising physician I was immediately available for consultation/collaboration.   Gwyneth Sprout, MD 03/11/12 2239

## 2012-03-27 ENCOUNTER — Ambulatory Visit: Payer: Medicare Other | Admitting: Endocrinology

## 2012-03-31 ENCOUNTER — Ambulatory Visit (INDEPENDENT_AMBULATORY_CARE_PROVIDER_SITE_OTHER): Payer: Medicare Other | Admitting: Endocrinology

## 2012-03-31 VITALS — BP 132/92 | HR 86 | Temp 97.2°F | Ht 65.0 in | Wt 197.0 lb

## 2012-03-31 DIAGNOSIS — E1069 Type 1 diabetes mellitus with other specified complication: Secondary | ICD-10-CM

## 2012-03-31 DIAGNOSIS — F319 Bipolar disorder, unspecified: Secondary | ICD-10-CM | POA: Insufficient documentation

## 2012-03-31 DIAGNOSIS — E10649 Type 1 diabetes mellitus with hypoglycemia without coma: Secondary | ICD-10-CM

## 2012-03-31 NOTE — Progress Notes (Signed)
Subjective:    Patient ID: Levi Meyers, male    DOB: [birth year], 46 y.o.   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  no cbg record, but states cbg's are highest in the afternoon (300).  It is lowest in am (100's).. Most cbg's are in the 200's.  He says he is now on the highest total daily # of units of insulin he has ever been on.   Past Medical History  Diagnosis Date  . Diabetes mellitus   . Hyperlipidemia   . Hypertension   . Kidney disease   . GERD (gastroesophageal reflux disease)   . History of kidney stones     Past Surgical History  Procedure Date  . Appendectomy   . Cholecystectomy   . Leg surgery   . Strabismus surgery     History   Social History  . Marital Status: Married    Spouse Name: N/A    Number of Children: N/A  . Years of Education: N/A   Occupational History  . Unemployed    Social History Main Topics  . Smoking status: Former Games developer  . Smokeless tobacco: Not on file  . Alcohol Use: No  . Drug Use: No  . Sexually Active:    Other Topics Concern  . Not on file   Social History Narrative   Regular exercise-yesCaffeine Use-no    Current Outpatient Prescriptions on File Prior to Visit  Medication Sig Dispense Refill  . aspirin 81 MG tablet Take 81 mg by mouth daily.       Marland Kitchen buPROPion (WELLBUTRIN SR) 100 MG 12 hr tablet Take 100 mg by mouth 2 (two) times daily.      . divalproex (DEPAKOTE ER) 500 MG 24 hr tablet Take 1,000 mg by mouth daily. Anti-seizure medication.  Consult needed.      . insulin aspart (NOVOLOG) 100 UNIT/ML injection Inject 50-60 Units into the skin 3 (three) times daily before meals. 60 units with breakfast, none with lunch, and 80 units with the evening meal, and pen needles 3/day      . insulin glargine (LANTUS) 100 UNIT/ML injection Inject 80 Units into the skin at bedtime.        Marland Kitchen lisinopril (PRINIVIL,ZESTRIL) 20 MG tablet Take 20 mg by mouth daily.       . metoprolol succinate (TOPROL-XL) 25 MG 24 hr tablet Take 25 mg by mouth 2 (two) times daily.       . pravastatin (PRAVACHOL) 40 MG tablet Take 40 mg by mouth at bedtime.      . Tamsulosin HCl (FLOMAX) 0.4 MG CAPS Take 0.4 mg by mouth 2 (two) times daily.      . traZODone (DESYREL) 100 MG tablet Take 100 mg by mouth at bedtime.      . promethazine (PHENERGAN) 25 MG tablet Take 1 tablet (25 mg total) by mouth every 6 (six) hours as needed for nausea.  12 tablet  0    Allergies  Allergen Reactions  . Codeine Nausea And Vomiting  . Victoza (Liraglutide) Nausea And Vomiting    Family History  Problem Relation Age of Onset  . Diabetes Mother   . Heart attack Mother   . Stroke Mother   . Diabetes Father   . Heart disease Other     Parent  . Heart disease Other  Grandparents   BP 132/92  Pulse 86  Temp 97.2 F (36.2 C) (Oral)  Ht 5\' 5"  (1.651 m)  Wt 197 lb (89.359 kg)  BMI 32.78 kg/m2  SpO2 96%  Review of Systems denies hypoglycemia    Objective:   Physical Exam VITAL SIGNS:  See vs page GENERAL: no distress SKIN:  Insulin injection sites at the anterior abdomen are normal     Assessment & Plan:  DM, needs increased rx

## 2012-03-31 NOTE — Patient Instructions (Addendum)
For now, continue lantus, 80 units daily, and: increase novolog to 3x a day (just before each meal), 60-0-80 units, no matter what your blood sugar is. Please come back for a follow-up appointment in 1 month.   check your blood sugar twice a day.  vary the time of day when you check, between before the 3 meals, and at bedtime.  also check if you have symptoms of your blood sugar being too high or too low.  please keep a record of the readings and bring it to your next appointment here.  please call us sooner if your blood sugar goes below 70, or if it stays over 200.

## 2012-04-07 ENCOUNTER — Other Ambulatory Visit: Payer: Self-pay | Admitting: Endocrinology

## 2012-04-07 MED ORDER — ONETOUCH DELICA LANCETS MISC: Status: DC

## 2012-04-07 MED ORDER — GLUCOSE BLOOD VI STRP: ORAL_STRIP | Status: DC

## 2012-04-07 NOTE — Telephone Encounter (Signed)
[facility] per pt is the pharmacy he switched to--They need a prescription for diabetic strips--pt ph# --[phone]

## 2012-04-07 NOTE — Telephone Encounter (Signed)
Rx sent for Onetouch test strips and Delica lancets per pt, pt informed.

## 2012-05-05 ENCOUNTER — Ambulatory Visit: Payer: Medicare Other | Admitting: Endocrinology

## 2012-05-07 ENCOUNTER — Encounter: Payer: Self-pay | Admitting: Endocrinology

## 2012-05-07 ENCOUNTER — Ambulatory Visit (INDEPENDENT_AMBULATORY_CARE_PROVIDER_SITE_OTHER): Payer: Medicare Other | Admitting: Endocrinology

## 2012-05-07 VITALS — BP 134/74 | HR 90 | Temp 97.5°F | Wt 188.0 lb

## 2012-05-07 DIAGNOSIS — E109 Type 1 diabetes mellitus without complications: Secondary | ICD-10-CM

## 2012-05-07 NOTE — Progress Notes (Signed)
Subjective:    Patient ID: Levi Meyers, male    DOB: 28-Mar-1966, 46 y.o.   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  no cbg record, but states cbg's vary from 105-385.  It is highest before lunch, and lowest at hs.  He says cbg's are improved overall, but most are in the 200's.   Past Medical History  Diagnosis Date  . Diabetes mellitus   . Hyperlipidemia   . Hypertension   . Kidney disease   . GERD (gastroesophageal reflux disease)   . History of kidney stones     Past Surgical History  Procedure Date  . Appendectomy   . Cholecystectomy   . Leg surgery   . Strabismus surgery     History   Social History  . Marital Status: Married    Spouse Name: N/A    Number of Children: N/A  . Years of Education: N/A   Occupational History  . Unemployed    Social History Main Topics  . Smoking status: Former Games developer  . Smokeless tobacco: Not on file  . Alcohol Use: No  . Drug Use: No  . Sexually Active:    Other Topics Concern  . Not on file   Social History Narrative   Regular exercise-yesCaffeine Use-no    Current Outpatient Prescriptions on File Prior to Visit  Medication Sig Dispense Refill  . aspirin 81 MG tablet Take 81 mg by mouth daily.       Marland Kitchen buPROPion (WELLBUTRIN SR) 100 MG 12 hr tablet Take 100 mg by mouth 2 (two) times daily.      . divalproex (DEPAKOTE ER) 500 MG 24 hr tablet Take 1,000 mg by mouth daily. Anti-seizure medication.  Consult needed.      Marland Kitchen glucose blood (ONE TOUCH ULTRA TEST) test strip Use as instructed to check blood sugar twice daily dx 250.81  200 each  3  . insulin aspart (NOVOLOG) 100 UNIT/ML injection 80 units with breakfast, none with lunch, and 80 units with the evening meal, and pen needles 3/day      . insulin glargine (LANTUS) 100 UNIT/ML injection Inject 80 Units into the skin at bedtime.        Marland Kitchen lisinopril (PRINIVIL,ZESTRIL) 20 MG tablet Take 20 mg by mouth daily.       . metoprolol succinate (TOPROL-XL) 25 MG 24 hr tablet Take 25 mg by mouth 2 (two) times daily.       Letta Pate DELICA LANCETS MISC Use as directed twice daily dx 250.81  200 each  3  . pravastatin (PRAVACHOL) 40 MG tablet Take 40 mg by mouth at bedtime.      . promethazine (PHENERGAN) 25 MG tablet Take 1 tablet (25 mg total) by mouth every 6 (six) hours as needed for nausea.  12 tablet  0  . Tamsulosin HCl (FLOMAX) 0.4 MG CAPS Take 0.4 mg by mouth 2 (two) times daily.      . traZODone (DESYREL) 100 MG tablet Take 100 mg by mouth at bedtime.        Allergies  Allergen Reactions  . Codeine Nausea And Vomiting  . Victoza (Liraglutide) Nausea And Vomiting    Family History  Problem Relation Age of Onset  . Diabetes Mother   . Heart attack Mother   . Stroke Mother   . Diabetes Father   .  Heart disease Other     Parent  . Heart disease Other     Grandparents    BP 134/74  Pulse 90  Temp 97.5 F (36.4 C) (Oral)  Wt 188 lb (85.276 kg)  SpO2 94%    Review of Systems denies hypoglycemia    Objective:   Physical Exam VITAL SIGNS:  See vs page GENERAL: no distress PSYCH: Alert and oriented x 3.  Does not appear anxious nor depressed.   Lab Results  Component Value Date   HGBA1C 13.5* 05/07/2012      Assessment & Plan:  DM.  a1c is much higher than reported cbg's.  He may need a simpler regimen

## 2012-05-07 NOTE — Patient Instructions (Addendum)
For now, continue lantus, 80 units daily, and: increase novolog to 3x a day (just before each meal), 80-0-80 units, no matter what your blood sugar is. Please come back for a follow-up appointment in 6 weeks.   check your blood sugar twice a day.  vary the time of day when you check, between before the 3 meals, and at bedtime.  also check if you have symptoms of your blood sugar being too high or too low.  please keep a record of the readings and bring it to your next appointment here.  please call us sooner if your blood sugar goes below 70, or if it stays over 200.

## 2012-05-08 LAB — HEMOGLOBIN A1C
Hgb A1c MFr Bld: 13.5 % — ABNORMAL HIGH (ref <5.7)
Mean Plasma Glucose: 341 mg/dL — ABNORMAL HIGH (ref <117)

## 2012-05-10 ENCOUNTER — Telehealth: Payer: Self-pay | Admitting: Endocrinology

## 2012-05-10 NOTE — Telephone Encounter (Signed)
please call patient: Blood sugar is really high. Why don't you try a once-a-day insulin only Please call if you agree

## 2012-05-11 NOTE — Telephone Encounter (Signed)
FYI, Pt has an appt for 10/24 to discuss insulin options with you.

## 2012-05-14 ENCOUNTER — Ambulatory Visit (INDEPENDENT_AMBULATORY_CARE_PROVIDER_SITE_OTHER): Payer: Medicare Other | Admitting: Endocrinology

## 2012-05-14 ENCOUNTER — Encounter: Payer: Self-pay | Admitting: Endocrinology

## 2012-05-14 VITALS — BP 128/84 | HR 101 | Temp 97.6°F | Resp 15 | Wt 187.2 lb

## 2012-05-14 DIAGNOSIS — E1069 Type 1 diabetes mellitus with other specified complication: Secondary | ICD-10-CM

## 2012-05-14 DIAGNOSIS — E10649 Type 1 diabetes mellitus with hypoglycemia without coma: Secondary | ICD-10-CM

## 2012-05-14 NOTE — Patient Instructions (Addendum)
Please let me know if i can be of further assistance to you.   you are advised to see a new endocrinologist as soon as possible.  our medical records department will release copies of your mecial records to your new doctor upon your written request.  there may be a charge for this service.   (pt leaves the office, refusing to receive this avs).

## 2012-05-14 NOTE — Progress Notes (Signed)
Subjective:    Patient ID: Levi Meyers, male    DOB: 18-Jul-1966, 46 y.o.   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  no cbg record, but states cbg's vary from 300-500.  There is no trend throughout the day.    Past Medical History  Diagnosis Date  . Diabetes mellitus   . Hyperlipidemia   . Hypertension   . Kidney disease   . GERD (gastroesophageal reflux disease)   . History of kidney stones     Past Surgical History  Procedure Date  . Appendectomy   . Cholecystectomy   . Leg surgery   . Strabismus surgery     History   Social History  . Marital Status: Married    Spouse Name: N/A    Number of Children: N/A  . Years of Education: N/A   Occupational History  . Unemployed    Social History Main Topics  . Smoking status: Former Games developer  . Smokeless tobacco: Not on file  . Alcohol Use: No  . Drug Use: No  . Sexually Active:    Other Topics Concern  . Not on file   Social History Narrative   Regular exercise-yesCaffeine Use-no    Current Outpatient Prescriptions on File Prior to Visit  Medication Sig Dispense Refill  . aspirin 81 MG tablet Take 81 mg by mouth daily.       Marland Kitchen buPROPion (WELLBUTRIN SR) 100 MG 12 hr tablet Take 100 mg by mouth 2 (two) times daily.      . divalproex (DEPAKOTE ER) 500 MG 24 hr tablet Take 1,000 mg by mouth daily. Anti-seizure medication.  Consult needed.      Marland Kitchen glucose blood (ONE TOUCH ULTRA TEST) test strip Use as instructed to check blood sugar twice daily dx 250.81  200 each  3  . insulin aspart (NOVOLOG) 100 UNIT/ML injection 80 units with breakfast, none with lunch, and 80 units with the evening meal, and pen needles 3/day      . insulin glargine (LANTUS) 100 UNIT/ML injection Inject 80 Units into the skin at bedtime.       Marland Kitchen lisinopril (PRINIVIL,ZESTRIL) 20 MG tablet Take 20 mg by mouth  daily.       . metoprolol succinate (TOPROL-XL) 25 MG 24 hr tablet Take 25 mg by mouth 2 (two) times daily.       Letta Pate DELICA LANCETS MISC Use as directed twice daily dx 250.81  200 each  3  . pravastatin (PRAVACHOL) 40 MG tablet Take 40 mg by mouth at bedtime.      . Tamsulosin HCl (FLOMAX) 0.4 MG CAPS Take 0.4 mg by mouth 2 (two) times daily.      . traZODone (DESYREL) 100 MG tablet Take 100 mg by mouth at bedtime.      . promethazine (PHENERGAN) 25 MG tablet Take 1 tablet (25 mg total) by mouth every 6 (six) hours as needed for nausea.  12 tablet  0    Allergies  Allergen Reactions  . Codeine Nausea And Vomiting  . Victoza (Liraglutide) Nausea And Vomiting    Family History  Problem Relation Age of Onset  . Diabetes Mother   . Heart attack Mother   . Stroke Mother   . Diabetes Father   . Heart disease Other     Parent  . Heart  disease Other     Grandparents    BP 128/84  Pulse 101  Temp 97.6 F (36.4 C) (Oral)  Resp 15  Wt 187 lb 3 oz (84.908 kg)  SpO2 97%  Review of Systems denies hypoglycemia.      Objective:   Physical Exam VITAL SIGNS:  See vs page GENERAL: no distress PSYCH: Alert and oriented x 3.  Does not appear anxious nor depressed.         Assessment & Plan:  He needs a simpler regimen.  i discussed change to lantus only, and he declines.  He says he wants to change to an endocrinologist in [place], and i told him that is fine

## 2012-05-21 ENCOUNTER — Telehealth: Payer: Self-pay | Admitting: *Deleted

## 2012-05-21 NOTE — Telephone Encounter (Signed)
i advised pt to change to lantus as his only insulin.  Please let me know if you agree.  If so, i'll advise you about dosage.

## 2012-05-21 NOTE — Telephone Encounter (Signed)
Pt called left message to return call. Returned call and spoke with wife. Pt and wife are unsure about dosage of Lantus. Pt thought you told him to inject 300 units of Lantus. Pt's wife states pt is not changing endocrinologist. She wants clarification about dosage of his Lantus.. Please advise.

## 2012-05-22 ENCOUNTER — Telehealth: Payer: Self-pay | Admitting: *Deleted

## 2012-05-22 NOTE — Telephone Encounter (Signed)
On medicare, you should be getting free strips.  Call [phone]

## 2012-05-22 NOTE — Telephone Encounter (Signed)
Pt called the number we provided and it is a fax number. Do you have another number that he can try to contact medicare?

## 2012-05-22 NOTE — Telephone Encounter (Signed)
Called pt and told him that he should be getting free strips, to call [phone]. Pt stated he would call.

## 2012-05-22 NOTE — Telephone Encounter (Signed)
Called and spoke with pt. Pt is doing 100 units of lantus 3 x a day. Pt no longer taking the novolog. Asked pt if he is recording his cbg's, pt stated he does not have any strips. Pt has medicare and medicare is no longer paying for strips. Pt states the cost is $100 for the strips and he is unable to afford them. Pt is going to try and get supplemental ins to help with the cost. Advised the pt to get to check his glucose level as soon as he could. Pt understands the importance. Pt asked when his next appt is, told him 11/21 at 7:45 am. Told pt to call if he is able to get his levels checked and to contact us with any problems.

## 2012-05-22 NOTE — Telephone Encounter (Signed)
Sorry, should be [phone]

## 2012-05-22 NOTE — Telephone Encounter (Signed)
Pt notified of the correct number.

## 2012-05-27 ENCOUNTER — Telehealth: Payer: Self-pay | Admitting: *Deleted

## 2012-05-27 NOTE — Telephone Encounter (Signed)
Ov soon 

## 2012-05-27 NOTE — Telephone Encounter (Signed)
Pt to come on Friday. Advised pt to bring record of cbg's.

## 2012-05-27 NOTE — Telephone Encounter (Signed)
Pt called stating his glucose level dropped yesterday afternnoon at 2 pm to 74 and again before bed time to 45. He injected a 100 units of Lantus at breakfast. Pt's glucose dropped to 45 right before bedtime. He injected 100 units then. Pt states he has not had this to happen before. Please advise.

## 2012-05-29 ENCOUNTER — Ambulatory Visit: Payer: Medicare Other | Admitting: Endocrinology

## 2012-06-01 ENCOUNTER — Telehealth: Payer: Self-pay | Admitting: Family Medicine

## 2012-06-01 NOTE — Telephone Encounter (Signed)
This response assumes the insulin pt is on is lantus--please verify with pt Reduce to 200 units daily

## 2012-06-01 NOTE — Telephone Encounter (Signed)
I called patient to reschedule appointment for tomorrow and he states that yesterday his blood sugar dropped to 35 and his vision was blurry. He already had another appointment scheduled for 06/11/12 and wanted to know if he should wait till then to be seen?

## 2012-06-01 NOTE — Telephone Encounter (Signed)
Please advise see note below

## 2012-06-01 NOTE — Telephone Encounter (Signed)
Please verify pt's current insulin dosage and advise

## 2012-06-01 NOTE — Telephone Encounter (Signed)
Pt states he does take lantus, pt advised per dr. Lillie Fragmin message and states an understanding

## 2012-06-02 ENCOUNTER — Ambulatory Visit: Payer: Medicare Other | Admitting: Endocrinology

## 2012-06-11 ENCOUNTER — Encounter: Payer: Self-pay | Admitting: Endocrinology

## 2012-06-11 ENCOUNTER — Ambulatory Visit (INDEPENDENT_AMBULATORY_CARE_PROVIDER_SITE_OTHER): Payer: Medicare Other | Admitting: Endocrinology

## 2012-06-11 VITALS — BP 128/70 | HR 98 | Temp 98.1°F | Wt 190.0 lb

## 2012-06-11 DIAGNOSIS — E1069 Type 1 diabetes mellitus with other specified complication: Secondary | ICD-10-CM

## 2012-06-11 DIAGNOSIS — E10649 Type 1 diabetes mellitus with hypoglycemia without coma: Secondary | ICD-10-CM

## 2012-06-11 NOTE — Patient Instructions (Addendum)
Change lantus to 250 units each morning. Please come back for a follow-up appointment in 6 weeks. check your blood sugar 3 times a day.  vary the time of day when you check, between before the 3 meals, and at bedtime.  also check if you have symptoms of your blood sugar being too high or too low.  please keep a record of the readings and bring it to your next appointment here.  please call us sooner if your blood sugar goes below 70, or if you have a lot of readings over 200.

## 2012-06-11 NOTE — Progress Notes (Signed)
Subjective:    Patient ID: Levi Meyers, male    DOB: [birth year], 46 y.o.   MRN: [id number]  HPI pt returns for f/u of IDDM (dx'ed 2004--no known chronic complications; i first saw this pt in early 2013, after an episode of severe hypoglycemia in the afternoon).  He struggled with multiple daily injections, so he takes lantus 200 units qd (he was having frequent hypoglycemia on 300 units qd).  no cbg record, but states cbg's are in the 200's.   Past Medical History  Diagnosis Date  . Diabetes mellitus   . Hyperlipidemia   . Hypertension   . Kidney disease   . GERD (gastroesophageal reflux disease)   . History of kidney stones     Past Surgical History  Procedure Date  . Appendectomy   . Cholecystectomy   . Leg surgery   . Strabismus surgery     History   Social History  . Marital Status: Married    Spouse Name: N/A    Number of Children: N/A  . Years of Education: N/A   Occupational History  . Unemployed    Social History Main Topics  . Smoking status: Former Games developer  . Smokeless tobacco: Not on file  . Alcohol Use: No  . Drug Use: No  . Sexually Active:    Other Topics Concern  . Not on file   Social History Narrative   Regular exercise-yesCaffeine Use-no    Current Outpatient Prescriptions on File Prior to Visit  Medication Sig Dispense Refill  . aspirin 81 MG tablet Take 81 mg by mouth daily.       Marland Kitchen buPROPion (WELLBUTRIN SR) 100 MG 12 hr tablet Take 100 mg by mouth 2 (two) times daily.      . divalproex (DEPAKOTE ER) 500 MG 24 hr tablet Take 1,000 mg by mouth daily. Anti-seizure medication.  Consult needed.      Marland Kitchen glucose blood (ONE TOUCH ULTRA TEST) test strip Use as instructed to check blood sugar twice daily dx 250.81  200 each  3  . lisinopril (PRINIVIL,ZESTRIL) 20 MG tablet Take 20 mg by mouth daily.       . metoprolol succinate (TOPROL-XL) 25 MG 24 hr tablet Take 25 mg by mouth 2 (two) times daily.       . metoprolol tartrate (LOPRESSOR) 25 MG tablet        . ONETOUCH DELICA LANCETS MISC Use as directed twice daily dx 250.81  200 each  3  . pravastatin (PRAVACHOL) 40 MG tablet Take 40 mg by mouth at bedtime.      . promethazine (PHENERGAN) 25 MG tablet Take 1 tablet (25 mg total) by mouth every 6 (six) hours as needed for nausea.  12 tablet  0  . Tamsulosin HCl (FLOMAX) 0.4 MG CAPS Take 0.4 mg by mouth 2 (two) times daily.      . traZODone (DESYREL) 100 MG tablet Take 100 mg by mouth at bedtime.        Allergies  Allergen Reactions  . Codeine Nausea And Vomiting  . Victoza (Liraglutide) Nausea And Vomiting    Family History  Problem Relation Age of Onset  . Diabetes Mother   . Heart attack Mother   . Stroke Mother   . Diabetes Father   . Heart disease Other     Parent  . Heart disease Other     Grandparents    BP 128/70  Pulse 98  Temp 98.1 F (36.7 C) (Oral)  Wt 190 lb (86.183 kg)  SpO2 96%  Review of Systems Denies LOC    Objective:   Physical Exam VITAL SIGNS:  See vs page GENERAL: no distress PSYCH: Alert and oriented x 3.  Does not appear anxious nor depressed.     Assessment & Plan:  DM, therapy limited by pt's need for a simple regimen.  i'll do the best i can.

## 2012-07-02 ENCOUNTER — Telehealth: Payer: Self-pay | Admitting: *Deleted

## 2012-07-02 ENCOUNTER — Encounter (HOSPITAL_BASED_OUTPATIENT_CLINIC_OR_DEPARTMENT_OTHER): Payer: Self-pay | Admitting: *Deleted

## 2012-07-02 ENCOUNTER — Emergency Department (HOSPITAL_BASED_OUTPATIENT_CLINIC_OR_DEPARTMENT_OTHER)
Admission: EM | Admit: 2012-07-02 | Discharge: 2012-07-02 | Disposition: A | Discharge status: Home / Self Care | Payer: Medicare PPO | Attending: Emergency Medicine | Admitting: Emergency Medicine

## 2012-07-02 DIAGNOSIS — Z8719 Personal history of other diseases of the digestive system: Secondary | ICD-10-CM | POA: Insufficient documentation

## 2012-07-02 DIAGNOSIS — Z7982 Long term (current) use of aspirin: Secondary | ICD-10-CM | POA: Insufficient documentation

## 2012-07-02 DIAGNOSIS — R739 Hyperglycemia, unspecified: Secondary | ICD-10-CM

## 2012-07-02 DIAGNOSIS — Z87442 Personal history of urinary calculi: Secondary | ICD-10-CM | POA: Insufficient documentation

## 2012-07-02 DIAGNOSIS — E1169 Type 2 diabetes mellitus with other specified complication: Secondary | ICD-10-CM | POA: Insufficient documentation

## 2012-07-02 DIAGNOSIS — Z79899 Other long term (current) drug therapy: Secondary | ICD-10-CM | POA: Insufficient documentation

## 2012-07-02 DIAGNOSIS — Z794 Long term (current) use of insulin: Secondary | ICD-10-CM | POA: Insufficient documentation

## 2012-07-02 DIAGNOSIS — I10 Essential (primary) hypertension: Secondary | ICD-10-CM | POA: Insufficient documentation

## 2012-07-02 DIAGNOSIS — R110 Nausea: Secondary | ICD-10-CM | POA: Insufficient documentation

## 2012-07-02 DIAGNOSIS — Z87891 Personal history of nicotine dependence: Secondary | ICD-10-CM | POA: Insufficient documentation

## 2012-07-02 DIAGNOSIS — R51 Headache: Secondary | ICD-10-CM | POA: Insufficient documentation

## 2012-07-02 LAB — CBC WITH DIFFERENTIAL/PLATELET
Basophils Absolute: 0 10*3/uL (ref 0.0–0.1)
Basophils Relative: 0 % (ref 0–1)
Eosinophils Absolute: 0.1 10*3/uL (ref 0.0–0.7)
MCH: 30.2 pg (ref 26.0–34.0)
MCHC: 35.8 g/dL (ref 30.0–36.0)
Neutrophils Relative %: 64 % (ref 43–77)
Platelets: 296 10*3/uL (ref 150–400)
RDW: 12.6 % (ref 11.5–15.5)

## 2012-07-02 LAB — GLUCOSE, CAPILLARY
Glucose-Capillary: 541 mg/dL — ABNORMAL HIGH (ref 70–99)
Glucose-Capillary: 190 mg/dL — ABNORMAL HIGH (ref 70–99)

## 2012-07-02 LAB — COMPREHENSIVE METABOLIC PANEL
ALT: 24 U/L (ref 0–53)
AST: 15 U/L (ref 0–37)
Albumin: 4 g/dL (ref 3.5–5.2)
Alkaline Phosphatase: 137 U/L — ABNORMAL HIGH (ref 39–117)
BUN: 8 mg/dL (ref 6–23)
Potassium: 3.9 mEq/L (ref 3.5–5.1)
Sodium: 133 mEq/L — ABNORMAL LOW (ref 135–145)
Total Protein: 8 g/dL (ref 6.0–8.3)

## 2012-07-02 LAB — URINALYSIS, ROUTINE W REFLEX MICROSCOPIC
Bilirubin Urine: NEGATIVE
Ketones, ur: NEGATIVE mg/dL
Leukocytes, UA: NEGATIVE
Nitrite: NEGATIVE
Urobilinogen, UA: 0.2 mg/dL (ref 0.0–1.0)

## 2012-07-02 LAB — URINE MICROSCOPIC-ADD ON: RBC / HPF: NONE SEEN RBC/hpf (ref <3)

## 2012-07-02 MED ORDER — SODIUM CHLORIDE 0.9 % IV SOLN
INTRAVENOUS | Status: DC
  Administered 2012-07-02: 4.8 [IU]/h via INTRAVENOUS

## 2012-07-02 MED ORDER — INSULIN REGULAR HUMAN 100 UNIT/ML IJ SOLN
INTRAMUSCULAR | Status: AC
  Filled 2012-07-02: qty 1

## 2012-07-02 MED ORDER — SODIUM CHLORIDE 0.9 % IV BOLUS (SEPSIS): 1000.0000 mL | Freq: Once | INTRAVENOUS | Status: DC

## 2012-07-02 MED ORDER — ACETAMINOPHEN 500 MG PO TABS
1000.0000 mg | ORAL_TABLET | Freq: Once | ORAL | Status: AC
  Administered 2012-07-02: 1000 mg via ORAL
  Filled 2012-07-02: qty 2

## 2012-07-02 NOTE — ED Notes (Signed)
Patient states he is an insulin dependent diabetic and takes lantus 250 units every morning.  States his last dose was yesterday morning and now is out of meds and is waiting for the mail order pharmacy to send him a new script.  States he called [facility] and that their glucose meter registered "high".

## 2012-07-02 NOTE — ED Provider Notes (Signed)
History     CSN: [id number]  Arrival date & time 07/02/12  1035   First MD Initiated Contact with Patient 07/02/12 1135      Chief Complaint  Patient presents with  . Hyperglycemia    (Consider location/radiation/quality/duration/timing/severity/associated sxs/prior treatment) Patient is a 46 y.o. male presenting with general illness. The history is provided by the patient. No language interpreter was used.  Illness  The current episode started today (Patient is a 46 year old man who felt queasy and swimmy headed this morning. He has 5 out of his Lantus insulin. She's also run out of test strips so he can't test his blood sugar at home. This has occurred because of a change in his health insurance.). The onset was sudden. Episode frequency: His health insurance requires him to have his insulin sent him from a mail order pharmacy. He has not received his Lantus insulin yet. The problem has been rapidly worsening. The problem is moderate. Nothing relieves the symptoms. Associated symptoms include nausea and headaches. Pertinent negatives include no fever. Recently, medical care has been given by a specialist and at another facility. Services received include medications given.    Past Medical History  Diagnosis Date  . Diabetes mellitus   . Hyperlipidemia   . Hypertension   . Kidney disease   . GERD (gastroesophageal reflux disease)   . History of kidney stones     Past Surgical History  Procedure Date  . Appendectomy   . Cholecystectomy   . Leg surgery   . Strabismus surgery     Family History  Problem Relation Age of Onset  . Diabetes Mother   . Heart attack Mother   . Stroke Mother   . Diabetes Father   . Heart disease Other     Parent  . Heart disease Other     Grandparents    History  Substance Use Topics  . Smoking status: Former Games developer  . Smokeless tobacco: Current User    Types: Snuff  . Alcohol Use: No      Review of Systems  Constitutional:  Negative for fever and chills.  Eyes: Negative.   Respiratory: Negative.   Cardiovascular: Negative.   Gastrointestinal: Positive for nausea.  Musculoskeletal: Negative.   Skin: Negative.   Neurological: Positive for headaches.  Psychiatric/Behavioral: Negative.     Allergies  Codeine and Victoza  Home Medications   Current Outpatient Rx  Name  Route  Sig  Dispense  Refill  . ASPIRIN 81 MG PO TABS   Oral   Take 81 mg by mouth daily.          . BUPROPION HCL ER (SR) 100 MG PO TB12   Oral   Take 100 mg by mouth 2 (two) times daily.         Marland Kitchen DIVALPROEX SODIUM ER 500 MG PO TB24   Oral   Take 1,000 mg by mouth daily. Anti-seizure medication.  Consult needed.         Marland Kitchen GLUCOSE BLOOD VI STRP      Use as instructed to check blood sugar twice daily dx 250.81   200 each   3   . INSULIN GLARGINE 100 UNIT/ML [place] SOLN   Subcutaneous   Inject 250 Units into the skin every morning.         Marland Kitchen LISINOPRIL 20 MG PO TABS   Oral   Take 20 mg by mouth daily.          Marland Kitchen METOPROLOL SUCCINATE  ER 25 MG PO TB24   Oral   Take 25 mg by mouth 2 (two) times daily.          Marland Kitchen METOPROLOL TARTRATE 25 MG PO TABS               . ONETOUCH DELICA LANCETS MISC      Use as directed twice daily dx 250.81   200 each   3   . PRAVASTATIN SODIUM 40 MG PO TABS   Oral   Take 40 mg by mouth at bedtime.         Marland Kitchen PROMETHAZINE HCL 25 MG PO TABS   Oral   Take 1 tablet (25 mg total) by mouth every 6 (six) hours as needed for nausea.   12 tablet   0   . TAMSULOSIN HCL 0.4 MG PO CAPS   Oral   Take 0.4 mg by mouth 2 (two) times daily.         . TRAZODONE HCL 100 MG PO TABS   Oral   Take 100 mg by mouth at bedtime.           BP 129/89  Pulse 99  Temp 98.4 F (36.9 C) (Oral)  Resp 16  Ht 5\' 5"  (1.651 m)  Wt 190 lb (86.183 kg)  BMI 31.62 kg/m2  SpO2 95%  Physical Exam  Nursing note and vitals reviewed. Constitutional: He is oriented to person, place, and time. He  appears well-developed and well-nourished. He appears distressed (in mild to moderate distress complaining of nausea).  Eyes: Conjunctivae normal and EOM are normal. Pupils are equal, round, and reactive to light.  Neck: Normal range of motion. Neck supple.  Cardiovascular: Normal rate and regular rhythm.   Pulmonary/Chest: Effort normal and breath sounds normal.  Abdominal: Soft. Bowel sounds are normal.  Musculoskeletal: Normal range of motion.  Neurological: He is alert and oriented to person, place, and time.       No sensory or motor deficit.  Skin: Skin is warm and dry.  Psychiatric: He has a normal mood and affect. His behavior is normal.    ED Course  Procedures (including critical care time)  Labs Reviewed  URINALYSIS, ROUTINE W REFLEX MICROSCOPIC - Abnormal; Notable for the following:    Specific Gravity, Urine 1.037 (*)     Glucose, UA >1000 (*)     All other components within normal limits  COMPREHENSIVE METABOLIC PANEL - Abnormal; Notable for the following:    Sodium 133 (*)     Chloride 94 (*)     Glucose, Bld 517 (*)     Alkaline Phosphatase 137 (*)     All other components within normal limits  GLUCOSE, CAPILLARY - Abnormal; Notable for the following:    Glucose-Capillary 541 (*)     All other components within normal limits  GLUCOSE, CAPILLARY - Abnormal; Notable for the following:    Glucose-Capillary 295 (*)     All other components within normal limits  GLUCOSE, CAPILLARY - Abnormal; Notable for the following:    Glucose-Capillary 257 (*)     All other components within normal limits  CBC WITH DIFFERENTIAL  URINE MICROSCOPIC-ADD ON   Pt received IV fluids and insulin via glucose stabilizer, with blood sugar back under 200.  He gets his lantus from a [facility]; the pharmacy told him he would have it delivered to him today.  He and his wife did not want me to prescribe Lantus for him because of  the expense.   1. Hyperglycemia        Carleene Cooper III, MD 07/02/12 [phone]

## 2012-07-02 NOTE — ED Notes (Signed)
NS 1000cc started upon arrival-EDP ordered 2nd L NS1000cc bolus started

## 2012-07-02 NOTE — Telephone Encounter (Signed)
PHYSICIAN NEW Rx FAX FORM SENT TO [facility] PATIENT NOTIFIED.Marland Kitchen

## 2012-07-18 ENCOUNTER — Emergency Department (HOSPITAL_BASED_OUTPATIENT_CLINIC_OR_DEPARTMENT_OTHER)
Admission: EM | Admit: 2012-07-18 | Discharge: 2012-07-18 | Discharge status: Against Medical Advice | Payer: Medicare PPO | Attending: Emergency Medicine | Admitting: Emergency Medicine

## 2012-07-18 ENCOUNTER — Encounter (HOSPITAL_BASED_OUTPATIENT_CLINIC_OR_DEPARTMENT_OTHER): Payer: Self-pay

## 2012-07-18 DIAGNOSIS — R197 Diarrhea, unspecified: Secondary | ICD-10-CM | POA: Insufficient documentation

## 2012-07-18 DIAGNOSIS — R52 Pain, unspecified: Secondary | ICD-10-CM | POA: Insufficient documentation

## 2012-07-18 LAB — GLUCOSE, CAPILLARY

## 2012-07-18 NOTE — ED Notes (Signed)
Patient not in room; all bathrooms and radiology checked, patient not found.

## 2012-07-18 NOTE — ED Notes (Signed)
Pt's room found empty with gown lying on bed. Pt not found in waiting room.

## 2012-07-18 NOTE — ED Notes (Addendum)
Body aches x 5 days-diarrhea started at 6pm-reports BS 400s

## 2012-07-28 ENCOUNTER — Ambulatory Visit: Payer: Medicare Other | Admitting: Endocrinology

## 2012-07-30 ENCOUNTER — Ambulatory Visit: Payer: Medicare Other | Admitting: Endocrinology

## 2012-08-05 ENCOUNTER — Emergency Department (HOSPITAL_COMMUNITY)
Admission: EM | Admit: 2012-08-05 | Discharge: 2012-08-05 | Disposition: A | Discharge status: Home / Self Care | Payer: Medicare PPO | Attending: Emergency Medicine | Admitting: Emergency Medicine

## 2012-08-05 ENCOUNTER — Ambulatory Visit: Payer: Medicare Other | Admitting: Endocrinology

## 2012-08-05 ENCOUNTER — Emergency Department (HOSPITAL_BASED_OUTPATIENT_CLINIC_OR_DEPARTMENT_OTHER)
Admission: EM | Admit: 2012-08-05 | Discharge: 2012-08-05 | Disposition: A | Discharge status: Home / Self Care | Payer: Medicare PPO | Attending: Emergency Medicine | Admitting: Emergency Medicine

## 2012-08-05 ENCOUNTER — Encounter (HOSPITAL_COMMUNITY): Payer: Self-pay

## 2012-08-05 ENCOUNTER — Telehealth (INDEPENDENT_AMBULATORY_CARE_PROVIDER_SITE_OTHER): Payer: Self-pay

## 2012-08-05 ENCOUNTER — Encounter (HOSPITAL_BASED_OUTPATIENT_CLINIC_OR_DEPARTMENT_OTHER): Payer: Self-pay | Admitting: Emergency Medicine

## 2012-08-05 DIAGNOSIS — E119 Type 2 diabetes mellitus without complications: Secondary | ICD-10-CM | POA: Insufficient documentation

## 2012-08-05 DIAGNOSIS — K648 Other hemorrhoids: Secondary | ICD-10-CM | POA: Insufficient documentation

## 2012-08-05 DIAGNOSIS — Z794 Long term (current) use of insulin: Secondary | ICD-10-CM | POA: Insufficient documentation

## 2012-08-05 DIAGNOSIS — Z87442 Personal history of urinary calculi: Secondary | ICD-10-CM | POA: Insufficient documentation

## 2012-08-05 DIAGNOSIS — Z79899 Other long term (current) drug therapy: Secondary | ICD-10-CM | POA: Insufficient documentation

## 2012-08-05 DIAGNOSIS — E1129 Type 2 diabetes mellitus with other diabetic kidney complication: Secondary | ICD-10-CM | POA: Insufficient documentation

## 2012-08-05 DIAGNOSIS — E785 Hyperlipidemia, unspecified: Secondary | ICD-10-CM | POA: Insufficient documentation

## 2012-08-05 DIAGNOSIS — N289 Disorder of kidney and ureter, unspecified: Secondary | ICD-10-CM | POA: Insufficient documentation

## 2012-08-05 DIAGNOSIS — Z87448 Personal history of other diseases of urinary system: Secondary | ICD-10-CM | POA: Insufficient documentation

## 2012-08-05 DIAGNOSIS — Z7982 Long term (current) use of aspirin: Secondary | ICD-10-CM | POA: Insufficient documentation

## 2012-08-05 DIAGNOSIS — Z87891 Personal history of nicotine dependence: Secondary | ICD-10-CM | POA: Insufficient documentation

## 2012-08-05 DIAGNOSIS — Z8719 Personal history of other diseases of the digestive system: Secondary | ICD-10-CM | POA: Insufficient documentation

## 2012-08-05 DIAGNOSIS — K219 Gastro-esophageal reflux disease without esophagitis: Secondary | ICD-10-CM | POA: Insufficient documentation

## 2012-08-05 DIAGNOSIS — I129 Hypertensive chronic kidney disease with stage 1 through stage 4 chronic kidney disease, or unspecified chronic kidney disease: Secondary | ICD-10-CM | POA: Insufficient documentation

## 2012-08-05 LAB — CBC WITH DIFFERENTIAL/PLATELET
HCT: 44 % (ref 39.0–52.0)
Hemoglobin: 15.8 g/dL (ref 13.0–17.0)
Lymphocytes Relative: 22 % (ref 12–46)
Lymphs Abs: 2.9 10*3/uL (ref 0.7–4.0)
MCV: 85.8 fL (ref 78.0–100.0)
Monocytes Absolute: 1 10*3/uL (ref 0.1–1.0)
Monocytes Relative: 8 % (ref 3–12)
Neutro Abs: 9.1 10*3/uL — ABNORMAL HIGH (ref 1.7–7.7)
WBC: 13.1 10*3/uL — ABNORMAL HIGH (ref 4.0–10.5)

## 2012-08-05 LAB — URINALYSIS, ROUTINE W REFLEX MICROSCOPIC
Bilirubin Urine: NEGATIVE
Glucose, UA: >1000 mg/dL — AB
Hgb urine dipstick: NEGATIVE
Ketones, ur: 40 mg/dL — AB
Leukocytes, UA: NEGATIVE
pH: 6 (ref 5.0–8.0)

## 2012-08-05 LAB — BASIC METABOLIC PANEL
BUN: 9 mg/dL (ref 6–23)
CO2: 28 mEq/L (ref 19–32)
Chloride: 96 mEq/L (ref 96–112)
Creatinine, Ser: 0.73 mg/dL (ref 0.50–1.35)
Glucose, Bld: 375 mg/dL — ABNORMAL HIGH (ref 70–99)

## 2012-08-05 LAB — URINE MICROSCOPIC-ADD ON

## 2012-08-05 MED ORDER — ANUSOL-HC 2.5 % RE CREA: TOPICAL_CREAM | RECTAL | Status: DC

## 2012-08-05 MED ORDER — TUCKS 50 % EX PADS: 1.0000 "application " | MEDICATED_PAD | Freq: Every day | CUTANEOUS | Status: DC

## 2012-08-05 MED ORDER — LIDOCAINE 4 % EX CREA
TOPICAL_CREAM | CUTANEOUS | Status: AC
  Administered 2012-08-05: 1
  Filled 2012-08-05: qty 5

## 2012-08-05 MED ORDER — OXYCODONE-ACETAMINOPHEN 5-325 MG PO TABS: 2.0000 | ORAL_TABLET | ORAL | Status: DC | PRN

## 2012-08-05 MED ORDER — ONDANSETRON HCL 4 MG/2ML IJ SOLN
4.0000 mg | Freq: Once | INTRAMUSCULAR | Status: AC
  Administered 2012-08-05: 4 mg via INTRAVENOUS
  Filled 2012-08-05: qty 2

## 2012-08-05 MED ORDER — MORPHINE SULFATE 4 MG/ML IJ SOLN
4.0000 mg | Freq: Once | INTRAMUSCULAR | Status: AC
  Administered 2012-08-05: 4 mg via INTRAVENOUS
  Filled 2012-08-05: qty 1

## 2012-08-05 MED ORDER — DIAZEPAM 5 MG/ML IJ SOLN
5.0000 mg | Freq: Once | INTRAMUSCULAR | Status: AC
  Administered 2012-08-05: 5 mg via INTRAVENOUS
  Filled 2012-08-05: qty 2

## 2012-08-05 MED ORDER — LIDOCAINE HCL 2 % EX GEL: Freq: Once | CUTANEOUS | Status: DC

## 2012-08-05 MED ORDER — LIDOCAINE 5 % EX OINT: TOPICAL_OINTMENT | CUTANEOUS | Status: DC | PRN

## 2012-08-05 MED ORDER — DOCUSATE SODIUM 100 MG PO CAPS: 100.0000 mg | ORAL_CAPSULE | Freq: Two times a day (BID) | ORAL | Status: DC

## 2012-08-05 MED ORDER — HYDROCORTISONE 2.5 % RE CREA: TOPICAL_CREAM | RECTAL | Status: DC

## 2012-08-05 MED ORDER — HYDROMORPHONE HCL PF 1 MG/ML IJ SOLN
1.0000 mg | Freq: Once | INTRAMUSCULAR | Status: AC
  Administered 2012-08-05: 1 mg via INTRAVENOUS
  Filled 2012-08-05: qty 1

## 2012-08-05 MED ORDER — LIDOCAINE 4 % EX CREA
TOPICAL_CREAM | CUTANEOUS | Status: AC
  Filled 2012-08-05: qty 5

## 2012-08-05 NOTE — Telephone Encounter (Signed)
Patient wife calling into office for appointment for Levi Meyers.  Appt scheduled for 08/11/12 @ 9:40 am w/Dr. Maisie Fus.  Wife advised to call our office Friday 08/07/12 morning to be worked into our Urgent Office w/Dr. Maisie Fus there's an increase in swelling, drainage or bleeding, uncontrolled pain.

## 2012-08-05 NOTE — Consult Note (Signed)
[facility] 15-Jun-1966  [id number].    Requesting MD: Dr. Manus Meyers Chief Complaint/Reason for Consult: rectal pain and possible prolapse vs hemorrhoids HPI: Levi Meyers is a 47 y.o. male who presents to the Emergency Department complaining of 12 hours of sudden onset.  It gradually worsening over the last 2 days, now constant rectal pain associated rectal bleeding that started while having a bowel movements. He states that he has chronic diarrhea and denies needing to strain.  He was seen last night at [facility] for the same and told to call the surgeon this morning for an appointment due to diagnosis of internal hemorrhoid, but wife states that the rectum appears more inflamed, prominent, and painful.  He denies prior episodes. He has a h/o uncontrolled DM and HTN.  The information was primarily given by the patients wife as well as the patient.  ROS:  No other complaints  Family History  Problem Relation Age of Onset  . Diabetes Mother   . Heart attack Mother   . Stroke Mother   . Diabetes Father   . Heart disease Other     Parent  . Heart disease Other     Grandparents    Past Medical History  Diagnosis Date  . Diabetes mellitus   . Hyperlipidemia   . Hypertension   . Kidney disease   . GERD (gastroesophageal reflux disease)   . History of kidney stones     Past Surgical History  Procedure Date  . Appendectomy   . Cholecystectomy   . Leg surgery   . Strabismus surgery     Social History:  reports that he has quit smoking. His smokeless tobacco use includes Snuff. He reports that he does not drink alcohol or use illicit drugs.  Allergies:  Allergies  Allergen Reactions  . Codeine Nausea And Vomiting  . Victoza (Liraglutide) Nausea And Vomiting    Vitals: Blood pressure 135/91, pulse 88, temperature 97.8 F (36.6 C), temperature source Oral, resp. rate 20, SpO2 96.00%.  Physical Exam: General: WD/WN white male who is laying prone in bed and appears  very uncomfortable Perirectal:  Large erythematous hemorrhoid protruding externally measuring about 2-3cm wide and is circumferencial, some bleeding noted on rectal exam which shows good tone.  Very tender to palpation, not reducible.  No evidence of infection or thrombosis.  No evidence of rectal prolapse.    Results for orders placed during the hospital encounter of 08/05/12 (from the past 48 hour(s))  CBC WITH DIFFERENTIAL     Status: Abnormal   Collection Time   08/05/12  7:52 AM      Component Value Range Comment   WBC 13.1 (*) 4.0 - 10.5 K/uL    RBC 5.13  4.22 - 5.81 MIL/uL    Hemoglobin 15.8  13.0 - 17.0 g/dL    HCT 47.8  29.5 - 62.1 %    MCV 85.8  78.0 - 100.0 fL    MCH 30.8  26.0 - 34.0 pg    MCHC 35.9  30.0 - 36.0 g/dL    RDW 30.8  65.7 - 84.6 %    Platelets 266  150 - 400 K/uL    Neutrophils Relative 69  43 - 77 %    Neutro Abs 9.1 (*) 1.7 - 7.7 K/uL    Lymphocytes Relative 22  12 - 46 %    Lymphs Abs 2.9  0.7 - 4.0 K/uL    Monocytes Relative 8  3 - 12 %  Monocytes Absolute 1.0  0.1 - 1.0 K/uL    Eosinophils Relative 1  0 - 5 %    Eosinophils Absolute 0.1  0.0 - 0.7 K/uL    Basophils Relative 0  0 - 1 %    Basophils Absolute 0.0  0.0 - 0.1 K/uL   BASIC METABOLIC PANEL     Status: Abnormal   Collection Time   08/05/12  7:52 AM      Component Value Range Comment   Sodium 137  135 - 145 mEq/L    Potassium 3.5  3.5 - 5.1 mEq/L    Chloride 96  96 - 112 mEq/L    CO2 28  19 - 32 mEq/L    Glucose, Bld 375 (*) 70 - 99 mg/dL    BUN 9  6 - 23 mg/dL    Creatinine, Ser 1.61  0.50 - 1.35 mg/dL    Calcium 9.6  8.4 - 09.6 mg/dL    GFR calc non Af Amer >90  >90 mL/min    GFR calc Af Amer >90  >90 mL/min    No results found.     Assessment/Plan 47 y/o male with a large hemorrhoid which is non-surgical given inflamed state 1.  Sitz bath q BM and TID, tuck pads, ice 2.  Proctofoam or Anusol 3.  Stool softener 4.  Follow up appt with Dr. Maisie Fus at [facility] to evaluate for  surgical intervention (08/11/12 at 9:20am)   DORT, [facility] 08/05/2012, 10:42 AM Pager: [phone]

## 2012-08-05 NOTE — ED Notes (Signed)
GI at the bedside.

## 2012-08-05 NOTE — ED Provider Notes (Signed)
This chart was scribed for Glynn Octave, MD by Bennett Scrape, ED Scribe. This patient was seen in room A01C/A01C and the patient's care was started at 8:08 AM.   Levi Meyers is a 47 y.o. male who presents to the Emergency Department complaining of 12 hours of sudden onset, gradually worsening, constant rectal pain associated rectal bleeding that started while having a bowel movements. He states that he has chronic diarrhea and denies needing to strain. He was seen last night at [facility] for the same and told to call the surgeon this morning for an internal hemorrhoid, but wife states that the rectum appears more inflamed and is more painful. Pt had one episode of urinary incontinence en route. He denies prior episodes. He reports nausea and scrotal pain but denies emesis, leg pain, back pain and abdominal pain. He has a h/o DM and HTN.  PE ABDOMEN: Soft, non-tender RECTAL: prolapsed rectum MUSCULOSKELETAL: 5/5 strength in bilateral lower extremities. Ankle plantar and dorsiflexion intact. Great toe extension intact bilaterally. +2 DP and PT pulses. +2 patellar reflexes bilaterally. Normal gait.  Prolapsed internal hemorrhoid/rectum, edematous mucosa.  Abdomen soft and nontender.   8:13 AM-Discussed treatment plan which includes sugar absorption and manual manipulation of the rectum with pt at bedside and pt agreed to plan.   9:14 AM-Pt rechecked and still complains of pain. Rectum rechecked and there is no real change.  9:42 AM-Consult complete with Gen Surg. Patient case explained and discussed. General Surgery will evaluate the pt in the ED. Call ended at 9:43 AM.  10:28 AM- Spoke with Gen. Surg. States that surgery is not an option currently. Advised me to give the pt pain medications prescription and to have sit baths, tucks pads and ice. Will get a follow up appointment with Dr. Maisie Fus for next week.  I personally performed the services described in this documentation,  which was scribed in my presence. The recorded information has been reviewed and is accurate.   Glynn Octave, MD 08/05/12 [phone]

## 2012-08-05 NOTE — ED Provider Notes (Signed)
History     CSN: [id number]  Arrival date & time 08/05/12  4098   First MD Initiated Contact with Patient 08/05/12 [phone]      Chief Complaint  Patient presents with  . Rectal Pain    (Consider location/radiation/quality/duration/timing/severity/associated sxs/prior treatment) HPI Comments: Patient is a 47 year old male who presents with sudden onset of sharp rectal pain 2 days ago. The pain occurred when patient was having a bowel movement. He denies straining at that time as he reports chronically loose stools. The pain is moderate, does not radiate and has been constant since the onset. He reports associated blood on the toilet paper. Patient denies constipation, previous hemorrhoids, change in diet, and melena. He has tried topical lidocaine that he was prescribed when he was seen at [facility] last night. He was instructed to contact a surgeon ASAP but the pain became too unbearable.    Past Medical History  Diagnosis Date  . Diabetes mellitus   . Hyperlipidemia   . Hypertension   . Kidney disease   . GERD (gastroesophageal reflux disease)   . History of kidney stones     Past Surgical History  Procedure Date  . Appendectomy   . Cholecystectomy   . Leg surgery   . Strabismus surgery     Family History  Problem Relation Age of Onset  . Diabetes Mother   . Heart attack Mother   . Stroke Mother   . Diabetes Father   . Heart disease Other     Parent  . Heart disease Other     Grandparents    History  Substance Use Topics  . Smoking status: Former Games developer  . Smokeless tobacco: Current User    Types: Snuff  . Alcohol Use: No      Review of Systems  Gastrointestinal: Positive for rectal pain.  All other systems reviewed and are negative.    Allergies  Codeine and Victoza  Home Medications   Current Outpatient Rx  Name  Route  Sig  Dispense  Refill  . ANUSOL-HC 2.5 % RE CREA      Apply rectally 2 times daily   1 Tube   0     Dispense  as written.   . ASPIRIN 81 MG PO TABS   Oral   Take 81 mg by mouth daily.          . BUPROPION HCL ER (SR) 100 MG PO TB12   Oral   Take 100 mg by mouth 2 (two) times daily.         Marland Kitchen DOCUSATE SODIUM 100 MG PO CAPS   Oral   Take 1 capsule (100 mg total) by mouth every 12 (twelve) hours.   60 capsule   0   . INSULIN GLARGINE 100 UNIT/ML [place] SOLN   Subcutaneous   Inject 250 Units into the skin every morning.         Marland Kitchen LIDOCAINE 5 % EX OINT   Topical   Apply 1 application topically as needed. Apply to rectum/hemorrhoids         . LISINOPRIL 20 MG PO TABS   Oral   Take 20 mg by mouth daily.          Marland Kitchen METOPROLOL TARTRATE 25 MG PO TABS   Oral   Take 25 mg by mouth 2 (two) times daily.          Marland Kitchen GLUCOSE BLOOD VI STRP  Use as instructed to check blood sugar twice daily dx 250.81   200 each   3   . ONETOUCH DELICA LANCETS MISC      Use as directed twice daily dx 250.81   200 each   3   . PROMETHAZINE HCL 25 MG PO TABS   Oral   Take 1 tablet (25 mg total) by mouth every 6 (six) hours as needed for nausea.   12 tablet   0     BP 135/91  Pulse 88  Temp 97.8 F (36.6 C) (Oral)  Resp 20  SpO2 96%  Physical Exam  Nursing note and vitals reviewed. Constitutional: He is oriented to person, place, and time. He appears well-developed and well-nourished. No distress.  HENT:  Head: Normocephalic and atraumatic.  Eyes: Conjunctivae normal and EOM are normal.  Neck: Normal range of motion.  Cardiovascular: Normal rate and regular rhythm.  Exam reveals no gallop and no friction rub.   No murmur heard. Pulmonary/Chest: Effort normal and breath sounds normal. He has no wheezes. He has no rales. He exhibits no tenderness.  Abdominal: Soft. He exhibits no distension. There is no tenderness. There is no rebound.  Genitourinary:       Internal hemorrhoids protruding from the rectum without active bleeding or fissure noted.   Musculoskeletal: Normal range of  motion.  Neurological: He is alert and oriented to person, place, and time. Coordination normal.       Speech is goal-oriented. Moves limbs without ataxia.   Skin: Skin is warm and dry.  Psychiatric: He has a normal mood and affect. His behavior is normal.    ED Course  Procedures (including critical care time)  Labs Reviewed  CBC WITH DIFFERENTIAL - Abnormal; Notable for the following:    WBC 13.1 (*)     Neutro Abs 9.1 (*)     All other components within normal limits  BASIC METABOLIC PANEL - Abnormal; Notable for the following:    Glucose, Bld 375 (*)     All other components within normal limits  URINALYSIS, ROUTINE W REFLEX MICROSCOPIC   No results found.   1. Prolapsed internal hemorrhoids       MDM  7:48 AM Patient will have basic labs and pain medication.   10:05 AM Labs unremarkable. I attempted to reduce the prolapse and was unsuccessful. Surgery will see the patient.   10:37 AM General surgery saw the patient who says the patient can be discharged with outpatient follow up, as he is not a candidate for surgery at this time. The hemorrhoid is too acutely large to surgically treat. Patient will be discharged with Anusol, tucks pads, and Percocet. Patient has a follow up appointment scheduled. Patient given strict return precautions. No further evaluation needed at this time.     Emilia Beck, PA-C 08/05/12 1500

## 2012-08-05 NOTE — ED Provider Notes (Signed)
History     CSN: [id number]  Arrival date & time 08/05/12  4098   First MD Initiated Contact with Patient 08/05/12 0304      Chief Complaint  Patient presents with  . Hemorrhoids    (Consider location/radiation/quality/duration/timing/severity/associated sxs/prior treatment) HPI HX per PT, onset yesterday, felt something coming from his rectum, associated sharp pain, no radiating, mod in severity, some blood on toilet paper, no h/o same. Denies any constipation or straining, no ABD pain. No F/C, no previous issues with hemorrhoids, no narcotic pain meds or change in diet. No black or tarry stools.  Past Medical History  Diagnosis Date  . Diabetes mellitus   . Hyperlipidemia   . Hypertension   . Kidney disease   . GERD (gastroesophageal reflux disease)   . History of kidney stones     Past Surgical History  Procedure Date  . Appendectomy   . Cholecystectomy   . Leg surgery   . Strabismus surgery     Family History  Problem Relation Age of Onset  . Diabetes Mother   . Heart attack Mother   . Stroke Mother   . Diabetes Father   . Heart disease Other     Parent  . Heart disease Other     Grandparents    History  Substance Use Topics  . Smoking status: Former Games developer  . Smokeless tobacco: Current User    Types: Snuff  . Alcohol Use: No      Review of Systems  Constitutional: Negative for fever and chills.  HENT: Negative for neck pain and neck stiffness.   Eyes: Negative for pain.  Respiratory: Negative for shortness of breath.   Cardiovascular: Negative for chest pain.  Gastrointestinal: Positive for blood in stool. Negative for abdominal pain.  Genitourinary: Negative for dysuria.  Musculoskeletal: Negative for back pain.  Skin: Negative for rash.  Neurological: Negative for headaches.  All other systems reviewed and are negative.    Allergies  Codeine and Victoza  Home Medications   Current Outpatient Rx  Name  Route  Sig  Dispense  Refill    . ANUSOL-HC 2.5 % RE CREA      Apply rectally 2 times daily   1 Tube   0     Dispense as written.   . ASPIRIN 81 MG PO TABS   Oral   Take 81 mg by mouth daily.          . BUPROPION HCL ER (SR) 100 MG PO TB12   Oral   Take 100 mg by mouth 2 (two) times daily.         Marland Kitchen DIVALPROEX SODIUM ER 500 MG PO TB24   Oral   Take 1,000 mg by mouth daily. Anti-seizure medication.  Consult needed.         Marland Kitchen DOCUSATE SODIUM 100 MG PO CAPS   Oral   Take 1 capsule (100 mg total) by mouth every 12 (twelve) hours.   60 capsule   0   . GLUCOSE BLOOD VI STRP      Use as instructed to check blood sugar twice daily dx 250.81   200 each   3   . INSULIN GLARGINE 100 UNIT/ML  SOLN   Subcutaneous   Inject 250 Units into the skin every morning.         Marland Kitchen LIDOCAINE 5 % EX OINT   Topical   Apply topically as needed.   35.44 g   0   .  LISINOPRIL 20 MG PO TABS   Oral   Take 20 mg by mouth daily.          Marland Kitchen METOPROLOL SUCCINATE ER 25 MG PO TB24   Oral   Take 25 mg by mouth 2 (two) times daily.          Marland Kitchen METOPROLOL TARTRATE 25 MG PO TABS               . ONETOUCH DELICA LANCETS MISC      Use as directed twice daily dx 250.81   200 each   3   . PRAVASTATIN SODIUM 40 MG PO TABS   Oral   Take 40 mg by mouth at bedtime.         Marland Kitchen PROMETHAZINE HCL 25 MG PO TABS   Oral   Take 1 tablet (25 mg total) by mouth every 6 (six) hours as needed for nausea.   12 tablet   0   . TAMSULOSIN HCL 0.4 MG PO CAPS   Oral   Take 0.4 mg by mouth 2 (two) times daily.         . TRAZODONE HCL 100 MG PO TABS   Oral   Take 100 mg by mouth at bedtime.           BP 142/97  Pulse 110  Temp 98.4 F (36.9 C) (Oral)  Resp 18  Ht 5\' 5"  (1.651 m)  Wt 190 lb (86.183 kg)  BMI 31.62 kg/m2  SpO2 99%  Physical Exam  Constitutional: He is oriented to person, place, and time. He appears well-developed and well-nourished.  HENT:  Head: Normocephalic and atraumatic.  Eyes: EOM are  normal. Pupils are equal, round, and reactive to light.  Neck: Neck supple.  Cardiovascular: Regular rhythm and intact distal pulses.   Pulmonary/Chest: Effort normal. No respiratory distress.  Genitourinary:       Rectal exam: internal hemorrhoids extruding from rectum, no active bleeding. No fluctuance or fissure noted  Musculoskeletal: Normal range of motion. He exhibits no edema.  Neurological: He is alert and oriented to person, place, and time.  Skin: Skin is warm and dry.    ED Course  Procedures (including critical care time)   1. Internal prolapsed hemorrhoids    Topical lido provided and did get good pain relief.  RX for same and [facility] referral.  Colace and constipation precautions given. Plan sits baths and close outpatient follow up.     MDM   Medication provided. VS and nursing noted reviewed/ considered. Pain improved.         Sunnie Nielsen, MD 08/05/12 0400

## 2012-08-05 NOTE — ED Notes (Signed)
Patient states he cant urinate at this time.

## 2012-08-05 NOTE — ED Notes (Signed)
Patient states that has hemorrhoids and it started bothering him earlier today.  He has tried soaking it in warm water w/o relief.

## 2012-08-05 NOTE — ED Notes (Signed)
  Spoke with IV nurse, PICC ordered.

## 2012-08-05 NOTE — ED Notes (Signed)
Pt c/o hemorrhoid pain onset tonight.

## 2012-08-05 NOTE — ED Provider Notes (Signed)
Medical screening examination/treatment/procedure(s) were conducted as a shared visit with non-physician practitioner(s) and myself.  I personally evaluated the patient during the encounter  See my additional note  Glynn Octave, MD 08/05/12 [phone]

## 2012-08-05 NOTE — ED Notes (Signed)
Pt. Is here for a hemorrhoid that is visible.  [facility] and the dc to home and to contact a Careers adviser.   Pt. Is having pain unable to get comfortable.

## 2012-08-10 ENCOUNTER — Telehealth: Payer: Self-pay

## 2012-08-10 NOTE — Telephone Encounter (Signed)
Called and talked with wife. Pt has rectal prolapse vs. Large hemorrhoid and was seen in the ED last week, which is the reason why he missed or cancelled his last 3 appts with Dr. Everardo All. He will have an appt with the surgeon tomorrow.  Pt let us know earlier today that we need to talk to his wife about his sugars (see previous message).   His wife tells me his sugars usually run high, but today at lunch they were not read by meter. He is on 250 units of Lantus taken in am.  - advised to take an extra dose of Lantus 30 units now, and to drink plenty of water. Ideally he will take a rapid acting insulin - wife tells me he might have some Humalog around - advised not to use it if >57 year old or if opened. But if it is good, to take 15 units of Humalog instead of the Lantus 30 units.  - I will FWD this to Dr. Everardo All and I advised them to reschedule his appt with him ASAP

## 2012-08-10 NOTE — Telephone Encounter (Signed)
Pt called stating his blood sugar is high over 400 please call pt's wife back and advise. [phone]   *Pt had originally called and was placed on hold, receptionist stated that the patient was bleeding and had a high bs reading, I asked pt about the bleeding and he stated that he was not bleeding and he was "sick of [place] treating like him like he didn't know what he was talking about and that he wanted to speak to Dr. Everardo All, I preceded to tell pt Dr. Everardo All was off today, but pt hung up, before I could explain that Dr. Elvera Lennox was here to take the phone call. I tried to call pt back but he refused to get on the phone, pt had his daughter to tell me to call his wife and not him.

## 2012-08-11 ENCOUNTER — Ambulatory Visit (INDEPENDENT_AMBULATORY_CARE_PROVIDER_SITE_OTHER): Payer: Medicare HMO | Admitting: General Surgery

## 2012-08-11 ENCOUNTER — Encounter (INDEPENDENT_AMBULATORY_CARE_PROVIDER_SITE_OTHER): Payer: Self-pay | Admitting: General Surgery

## 2012-08-11 VITALS — BP 140/86 | HR 89 | Temp 98.6°F | Resp 16 | Ht 65.0 in | Wt 183.8 lb

## 2012-08-11 DIAGNOSIS — K649 Unspecified hemorrhoids: Secondary | ICD-10-CM

## 2012-08-11 DIAGNOSIS — K625 Hemorrhage of anus and rectum: Secondary | ICD-10-CM

## 2012-08-11 DIAGNOSIS — K648 Other hemorrhoids: Secondary | ICD-10-CM

## 2012-08-11 NOTE — Progress Notes (Signed)
Chief Complaint  Patient presents with  . Rectal Pain    Evaluate hems    HISTORY: Levi Meyers is a 47 y.o. male who presents to the office with rectal pain.  Other symptoms include bleeding and urinary retention.  This had been occurring for since tuesday.  He presented to urgent care then and was given some hemorrhoid cream and lidocaine ointment.  BM's makes the symptoms worse.   It is continuous in nature.  His bowel habits are regular and his bowel movements are always loose.  His fiber intake is minimal.  He is having bloody bowel movements.  He does not have any FH of IBD.  He has never had a colonoscopy.  He does have prolapsing tissue.     Past Medical History  Diagnosis Date  . Diabetes mellitus   . Hyperlipidemia   . Hypertension   . Kidney disease   . GERD (gastroesophageal reflux disease)   . History of kidney stones       Past Surgical History  Procedure Date  . Appendectomy   . Cholecystectomy   . Leg surgery   . Strabismus surgery   . Eye surgery   . Knee cartilage surgery   . Skin graft 1974        Current Outpatient Prescriptions  Medication Sig Dispense Refill  . aspirin 81 MG tablet Take 81 mg by mouth daily.       Marland Kitchen docusate sodium (COLACE) 100 MG capsule Take 1 capsule (100 mg total) by mouth every 12 (twelve) hours.  60 capsule  0  . glucose blood (ONE TOUCH ULTRA TEST) test strip Use as instructed to check blood sugar twice daily dx 250.81  200 each  3  . hydrocortisone (ANUSOL-HC) 2.5 % rectal cream Apply rectally 3 times daily  30 g  0  . insulin glargine (LANTUS) 100 UNIT/ML injection Inject 250 Units into the skin every morning.      . lidocaine (XYLOCAINE) 5 % ointment Apply 1 application topically as needed. Apply to rectum/hemorrhoids      . lisinopril (PRINIVIL,ZESTRIL) 20 MG tablet Take 20 mg by mouth daily.       . metoprolol tartrate (LOPRESSOR) 25 MG tablet Take 25 mg by mouth 2 (two) times daily.       Letta Pate DELICA LANCETS MISC Use as  directed twice daily dx 250.81  200 each  3  . oxyCODONE-acetaminophen (PERCOCET/ROXICET) 5-325 MG per tablet Take 2 tablets by mouth every 4 (four) hours as needed for pain.  20 tablet  0  . pravastatin (PRAVACHOL) 20 MG tablet Take 20 mg by mouth daily.      Weyman Croon Hazel (TUCKS) 50 % PADS Apply 1 application topically 6 (six) times daily.  40 each  0  . ANUSOL-HC 2.5 % rectal cream Apply rectally 2 times daily  1 Tube  0  . promethazine (PHENERGAN) 25 MG tablet Take 1 tablet (25 mg total) by mouth every 6 (six) hours as needed for nausea.  12 tablet  0      Allergies  Allergen Reactions  . Codeine Nausea And Vomiting  . Victoza (Liraglutide) Nausea And Vomiting      Family History  Problem Relation Age of Onset  . Diabetes Mother   . Heart attack Mother   . Stroke Mother   . Diabetes Father   . Heart disease Other     Parent  . Heart disease Other     Grandparents  History   Social History  . Marital Status: Married    Spouse Name: N/A    Number of Children: N/A  . Years of Education: N/A   Occupational History  . Unemployed    Social History Main Topics  . Smoking status: Former Games developer  . Smokeless tobacco: Current User    Types: Snuff  . Alcohol Use: No  . Drug Use: No  . Sexually Active:    Other Topics Concern  . None   Social History Narrative   Regular exercise-yesCaffeine Use-no      REVIEW OF SYSTEMS - PERTINENT POSITIVES ONLY: Review of Systems - General ROS: negative for - chills, fever or weight loss Hematological and Lymphatic ROS: negative for - bleeding problems, blood clots or bruising Respiratory ROS: no cough, shortness of breath, or wheezing Cardiovascular ROS: no chest pain or dyspnea on exertion Gastrointestinal ROS: positive for - abdominal pain and diarrhea, bloody stools Genito-Urinary ROS: no dysuria, trouble voiding, or hematuria  EXAM: Filed Vitals:   08/11/12 0943  BP: 140/86  Pulse: 89  Temp: 98.6 F (37 C)  Resp: 16     General appearance: alert and cooperative Resp: clear to auscultation bilaterally Cardio: regular rate and rhythm GI: normal findings: no masses palpable and no scars, striae, dilated veins, rashes, or lesions and abnormal findings:  suprapubic tenderness Anal Exam Findings: small prolapsed internal hemorrhoids, enlarged and inflamed external hemorrhoids   ASSESSMENT AND PLAN:  Continue Anusol cream and soaking in warm water.  Use lidocaine cream for pain.  No straining or heavy lifting.  Given his findings of rectal bleeding and abdominal pain, I have recommended that he get a colonoscopy.  We will schedule this for about 4 weeks from now to allow his hemorrhoids to subside.  I will take another look at his hemorrhoids then.       Vanita Panda, MD Colon and Rectal Surgery / General Surgery [facility]      Visit Diagnoses: 1. Prolapsed hemorrhoids   2. Rectal bleeding     Primary Care Physician: Colette Ribas, MD

## 2012-08-11 NOTE — Patient Instructions (Addendum)
HEMORRHOIDS    Did you know... Hemorrhoids are one of the most common ailments known.  More than half the population will develop hemorrhoids, usually after age 47.  Millions of Americans currently suffer from hemorrhoids.  The average person suffers in silence for a long period before seeking medical care.  Today's treatment methods make some types of hemorrhoid removal much less painful.  What are hemorrhoids? Often described as "varicose veins of the anus and rectum", hemorrhoids are enlarged, bulging blood vessels in and about the anus and lower rectum. There are two types of hemorrhoids: external and internal, which refer to their location.  External (outside) hemorrhoids develop near the anus and are covered by very sensitive skin. These are usually painless. However, if a blood clot (thrombosis) develops in an external hemorrhoid, it becomes a painful, hard lump. The external hemorrhoid may bleed if it ruptures. Internal (inside) hemorrhoids develop within the anus beneath the lining. Painless bleeding and protrusion during bowel movements are the most common symptom. However, an internal hemorrhoid can cause severe pain if it is completely "prolapsed" - protrudes from the anal opening and cannot be pushed back inside.   What causes hemorrhoids? An exact cause is unknown; however, the upright posture of humans alone forces a great deal of pressure on the rectal veins, which sometimes causes them to bulge. Other contributing factors include:  . Aging  . Chronic constipation or diarrhea  . Pregnancy  . Heredity  . Straining during bowel movements  . Faulty bowel function due to overuse of laxatives or enemas . Spending long periods of time (e.g., reading) on the toilet  Whatever the cause, the tissues supporting the vessels stretch. As a result, the vessels dilate; their walls become thin and bleed. If the stretching and pressure continue, the weakened vessels protrude.  What are the  symptoms? If you notice any of the following, you could have hemorrhoids:  . Bleeding during bowel movements  . Protrusion during bowel movements . Itching in the anal area  . Pain  . Sensitive lump(s)  How are hemorrhoids treated? Mild symptoms can be relieved frequently by increasing the amount of fiber (e.g., fruits, vegetables, breads and cereals) and fluids in the diet. Eliminating excessive straining reduces the pressure on hemorrhoids and helps prevent them from protruding. A sitz bath - sitting in plain warm water for about 10 minutes - can also provide some relief . With these measures, the pain and swelling of most symptomatic hemorrhoids will decrease in two to seven days, and the firm lump should recede within four to six weeks. In cases of severe or persistent pain from a thrombosed hemorrhoid, your physician may elect to remove the hemorrhoid containing the clot with a small incision. Performed under local anesthesia as an outpatient, this procedure generally provides relief. Severe hemorrhoids may require special treatment, much of which can be performed on an outpatient basis.  . Ligation - the rubber band treatment - works effectively on internal hemorrhoids that protrude with bowel movements. A small rubber band is placed over the hemorrhoid, cutting off its blood supply. The hemorrhoid and the band fall off in a few days and the wound usually heals in a week or two. This procedure sometimes produces mild discomfort and bleeding and may need to be repeated for a full effect.  There is a more intense version of this procedure that is done in the OR as outpatient surgery called THD.  It involves identifying blood vessels leading to the   hemorrhoids and then tying them off with sutures.  This method is a little more painful than rubber band ligation but less painful than traditional hemorrhoidectomy and usually does not have to be repeated.  It is best for internal hemorrhoids that  bleed.  Rubber Band Ligation of Internal Hemorrhoids:  A.  Bulging, bleeding, internal hemorrhoid B.  Rubber band applied at the base of the hemorrhoid C.  About 7 days later, the banded hemorrhoid has fallen off leaving a small scar (arrow)  . Injection and Coagulation can also be used on bleeding hemorrhoids that do not protrude. Both methods are relatively painless and cause the hemorrhoid to shrivel up. Marland Kitchen Hemorrhoidectomy - surgery to remove the hemorrhoids - is the most complete method for removal of internal and external hemorrhoids. It is necessary when (1) clots repeatedly form in external hemorrhoids; (2) ligation fails to treat internal hemorrhoids; (3) the protruding hemorrhoid cannot be reduced; or (4) there is persistent bleeding. A hemorrhoidectomy removes excessive tissue that causes the bleeding and protrusion. It is done under anesthesia using sutures, and may, depending upon circumstances, require hospitalization and a period of inactivity. Laser hemorrhoidectomies do not offer any advantage over standard operative techniques. They are also quite expensive, and contrary to popular belief, are no less painful.  Do hemorrhoids lead to cancer? No. There is no relationship between hemorrhoids and cancer. However, the symptoms of hemorrhoids, particularly bleeding, are similar to those of colorectal cancer and other diseases of the digestive system. Therefore, it is important that all symptoms are investigated by a physician specially trained in treating diseases of the colon and rectum and that everyone 50 years or older undergo screening tests for colorectal cancer. Do not rely on over-the-counter medications or other self-treatments. See a colorectal surgeon first so your symptoms can be properly evaluated and effective treatment prescribed.  2012 [employer] of Colon [employer] Rectal Sur[employer]    CENTRAL  SURGERY  ONE-DAY (1) PRE-OP HOME COLON PREP INSTRUCTIONS: ** MIRALAX  / GATORADE PREP **  You must follow the instructions below carefully.  If you have questions or problems, please call and speak to someone in the clinic department at our office:   [phone].     INSTRUCTIONS: 1. Five days prior to your procedure do not eat nuts, popcorn, or fruit with seeds.  Stop all fiber supplements such as Metamucil, Citrucel, etc. 2. Two days before surgery fill the prescription at a pharmacy of your choice and purchase the additional supplies below.         MIRALAX - GATORADE -- DULCOLAX TABS:   Purchase a bottle of MIRALAX  (255 gm bottle)    In addition, purchase four (4) DULCOLAX TABLETS (no prescription required- ask the pharmacist if you can't find them)    Purchase one 64 oz GATORADE.  (Do NOT purchase red Gatorade; any other flavor is acceptable) and place in refrigerator to get cold.  3.   Day Before Surgery:   6 am: take the 4 Dulcolax tablets   You may only have clear liquids (tea, coffee, juice, broth, jello, soft drinks, gummy bears).  You cannot have solid foods, cream, milk or milk products.  Drink at lease 8 ounces of liquids every hour while awake.   Mix the entire bottle of MiraLax and the Gatorade in a large container.    10:00am: Begin drinking the Gatorade mixture until gone (8 oz every 15-30 minutes).      You may suck on a lime wedge  or hard candy to "freshen your palate" in between glasses   If you are a diabetic, take your blood sugar reading several time throughout the prep.  Have some juice available to take if your sugar level gets too low   You may feel chilled while taking the prep.  Have some warm tea or broth to help warm up.   Continue clear liquids until midnight or bedtime  3. The day of your procedure:   Do not eat or drink ANYTHING after midnight before your surgery.     If you take Heart or Blood Pressure medicine, ask the pre-op nurses about these during your preop appointment.   Further pre-operative instructions will be given  to you from the hospital.   Expect to be contacted 5-7 days before your surgery.

## 2012-08-21 ENCOUNTER — Ambulatory Visit: Payer: Medicare Other | Admitting: Endocrinology

## 2012-08-28 ENCOUNTER — Encounter: Payer: Self-pay | Admitting: Endocrinology

## 2012-08-28 ENCOUNTER — Ambulatory Visit (INDEPENDENT_AMBULATORY_CARE_PROVIDER_SITE_OTHER): Payer: Medicare Other | Admitting: Endocrinology

## 2012-08-28 VITALS — BP 122/78 | HR 78 | Wt 193.0 lb

## 2012-08-28 DIAGNOSIS — E109 Type 1 diabetes mellitus without complications: Secondary | ICD-10-CM

## 2012-08-28 MED ORDER — INSULIN ASPART 100 UNIT/ML ~~LOC~~ SOLN: 25.0000 [IU] | Freq: Every day | SUBCUTANEOUS | Status: DC
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
## 2012-08-28 NOTE — Patient Instructions (Addendum)
reduce lantus to 225 units each morning. Add novolog 25 units daily, with the largest meal of the day.   Please come back for a follow-up appointment in 3 months. check your blood sugar 3 times a day.  vary the time of day when you check, between before the 3 meals, and at bedtime.  also check if you have symptoms of your blood sugar being too high or too low.  please keep a record of the readings and bring it to your next appointment here.  please call us sooner if your blood sugar goes below 70, or if you have a lot of readings over 200.  blood tests are being requested for you today.  We'll contact you with results.

## 2012-08-31 ENCOUNTER — Telehealth (INDEPENDENT_AMBULATORY_CARE_PROVIDER_SITE_OTHER): Payer: Self-pay | Admitting: General Surgery

## 2012-08-31 NOTE — Telephone Encounter (Signed)
Pt called to clarify his bowel prep orders.  Went over them with him until he could repeat them to me.

## 2012-09-08 ENCOUNTER — Encounter (HOSPITAL_COMMUNITY): Payer: Self-pay

## 2012-09-08 ENCOUNTER — Encounter (HOSPITAL_COMMUNITY)
Admission: RE | Disposition: A | Discharge status: Home / Self Care | Payer: Self-pay | Source: Ambulatory Visit | Attending: General Surgery

## 2012-09-08 ENCOUNTER — Encounter (HOSPITAL_COMMUNITY): Payer: Self-pay | Admitting: *Deleted

## 2012-09-08 ENCOUNTER — Ambulatory Visit (HOSPITAL_COMMUNITY): Admit: 2012-09-08 | Payer: Medicare Other | Admitting: General Surgery

## 2012-09-08 ENCOUNTER — Ambulatory Visit (HOSPITAL_COMMUNITY)
Admission: RE | Admit: 2012-09-08 | Discharge: 2012-09-08 | Disposition: A | Discharge status: Home / Self Care | Payer: Medicare PPO | Source: Ambulatory Visit | Attending: General Surgery | Admitting: General Surgery

## 2012-09-08 DIAGNOSIS — E785 Hyperlipidemia, unspecified: Secondary | ICD-10-CM | POA: Insufficient documentation

## 2012-09-08 DIAGNOSIS — E119 Type 2 diabetes mellitus without complications: Secondary | ICD-10-CM | POA: Insufficient documentation

## 2012-09-08 DIAGNOSIS — D126 Benign neoplasm of colon, unspecified: Secondary | ICD-10-CM | POA: Insufficient documentation

## 2012-09-08 DIAGNOSIS — Z7982 Long term (current) use of aspirin: Secondary | ICD-10-CM | POA: Insufficient documentation

## 2012-09-08 DIAGNOSIS — K648 Other hemorrhoids: Secondary | ICD-10-CM | POA: Insufficient documentation

## 2012-09-08 DIAGNOSIS — K219 Gastro-esophageal reflux disease without esophagitis: Secondary | ICD-10-CM | POA: Insufficient documentation

## 2012-09-08 DIAGNOSIS — I10 Essential (primary) hypertension: Secondary | ICD-10-CM | POA: Insufficient documentation

## 2012-09-08 DIAGNOSIS — K5730 Diverticulosis of large intestine without perforation or abscess without bleeding: Secondary | ICD-10-CM | POA: Insufficient documentation

## 2012-09-08 DIAGNOSIS — Z794 Long term (current) use of insulin: Secondary | ICD-10-CM | POA: Insufficient documentation

## 2012-09-08 DIAGNOSIS — Z79899 Other long term (current) drug therapy: Secondary | ICD-10-CM | POA: Insufficient documentation

## 2012-09-08 DIAGNOSIS — K625 Hemorrhage of anus and rectum: Secondary | ICD-10-CM

## 2012-09-08 HISTORY — DX: Major depressive disorder, single episode, unspecified: F32.9

## 2012-09-08 HISTORY — DX: Unspecified asthma, uncomplicated: J45.909

## 2012-09-08 HISTORY — PX: COLONOSCOPY: SHX5424

## 2012-09-08 HISTORY — DX: Depression, unspecified: F32.A

## 2012-09-08 SURGERY — COLONOSCOPY
Anesthesia: Moderate Sedation

## 2012-09-08 MED ORDER — SODIUM CHLORIDE 0.9 % IV SOLN: INTRAVENOUS | Status: DC

## 2012-09-08 MED ORDER — DIPHENHYDRAMINE HCL 50 MG/ML IJ SOLN
INTRAMUSCULAR | Status: AC
  Filled 2012-09-08: qty 1

## 2012-09-08 MED ORDER — MIDAZOLAM HCL 10 MG/2ML IJ SOLN
INTRAMUSCULAR | Status: DC | PRN
  Administered 2012-09-08: 2.5 mg via INTRAVENOUS

## 2012-09-08 MED ORDER — FENTANYL CITRATE 0.05 MG/ML IJ SOLN
INTRAMUSCULAR | Status: DC | PRN
  Administered 2012-09-08: 25 ug via INTRAVENOUS

## 2012-09-08 MED ORDER — FENTANYL CITRATE 0.05 MG/ML IJ SOLN
INTRAMUSCULAR | Status: DC | PRN
  Administered 2012-09-08 (×2): 25 ug via INTRAVENOUS

## 2012-09-08 MED ORDER — MIDAZOLAM HCL 10 MG/2ML IJ SOLN
INTRAMUSCULAR | Status: DC | PRN
  Administered 2012-09-08 (×2): 2.5 mg via INTRAVENOUS

## 2012-09-08 MED ORDER — FENTANYL CITRATE 0.05 MG/ML IJ SOLN
INTRAMUSCULAR | Status: AC
  Filled 2012-09-08: qty 4

## 2012-09-08 MED ORDER — MIDAZOLAM HCL 10 MG/2ML IJ SOLN
INTRAMUSCULAR | Status: AC
  Filled 2012-09-08: qty 4

## 2012-09-08 NOTE — H&P (View-Only) (Signed)
Chief Complaint  Patient presents with  . Rectal Pain    Evaluate hems    HISTORY: Levi Meyers is a 47 y.o. male who presents to the office with rectal pain.  Other symptoms include bleeding and urinary retention.  This had been occurring for since tuesday.  He presented to urgent care then and was given some hemorrhoid cream and lidocaine ointment.  BM's makes the symptoms worse.   It is continuous in nature.  His bowel habits are regular and his bowel movements are always loose.  His fiber intake is minimal.  He is having bloody bowel movements.  He does not have any FH of IBD.  He has never had a colonoscopy.  He does have prolapsing tissue.     Past Medical History  Diagnosis Date  . Diabetes mellitus   . Hyperlipidemia   . Hypertension   . Kidney disease   . GERD (gastroesophageal reflux disease)   . History of kidney stones       Past Surgical History  Procedure Date  . Appendectomy   . Cholecystectomy   . Leg surgery   . Strabismus surgery   . Eye surgery   . Knee cartilage surgery   . Skin graft 1974        Current Outpatient Prescriptions  Medication Sig Dispense Refill  . aspirin 81 MG tablet Take 81 mg by mouth daily.       . docusate sodium (COLACE) 100 MG capsule Take 1 capsule (100 mg total) by mouth every 12 (twelve) hours.  60 capsule  0  . glucose blood (ONE TOUCH ULTRA TEST) test strip Use as instructed to check blood sugar twice daily dx 250.81  200 each  3  . hydrocortisone (ANUSOL-HC) 2.5 % rectal cream Apply rectally 3 times daily  30 g  0  . insulin glargine (LANTUS) 100 UNIT/ML injection Inject 250 Units into the skin every morning.      . lidocaine (XYLOCAINE) 5 % ointment Apply 1 application topically as needed. Apply to rectum/hemorrhoids      . lisinopril (PRINIVIL,ZESTRIL) 20 MG tablet Take 20 mg by mouth daily.       . metoprolol tartrate (LOPRESSOR) 25 MG tablet Take 25 mg by mouth 2 (two) times daily.       . ONETOUCH DELICA LANCETS MISC Use as  directed twice daily dx 250.81  200 each  3  . oxyCODONE-acetaminophen (PERCOCET/ROXICET) 5-325 MG per tablet Take 2 tablets by mouth every 4 (four) hours as needed for pain.  20 tablet  0  . pravastatin (PRAVACHOL) 20 MG tablet Take 20 mg by mouth daily.      . Witch Hazel (TUCKS) 50 % PADS Apply 1 application topically 6 (six) times daily.  40 each  0  . ANUSOL-HC 2.5 % rectal cream Apply rectally 2 times daily  1 Tube  0  . promethazine (PHENERGAN) 25 MG tablet Take 1 tablet (25 mg total) by mouth every 6 (six) hours as needed for nausea.  12 tablet  0      Allergies  Allergen Reactions  . Codeine Nausea And Vomiting  . Victoza (Liraglutide) Nausea And Vomiting      Family History  Problem Relation Age of Onset  . Diabetes Mother   . Heart attack Mother   . Stroke Mother   . Diabetes Father   . Heart disease Other     Parent  . Heart disease Other     Grandparents      History   Social History  . Marital Status: Married    Spouse Name: N/A    Number of Children: N/A  . Years of Education: N/A   Occupational History  . Unemployed    Social History Main Topics  . Smoking status: Former Smoker  . Smokeless tobacco: Current User    Types: Snuff  . Alcohol Use: No  . Drug Use: No  . Sexually Active:    Other Topics Concern  . None   Social History Narrative   Regular exercise-yesCaffeine Use-no      REVIEW OF SYSTEMS - PERTINENT POSITIVES ONLY: Review of Systems - General ROS: negative for - chills, fever or weight loss Hematological and Lymphatic ROS: negative for - bleeding problems, blood clots or bruising Respiratory ROS: no cough, shortness of breath, or wheezing Cardiovascular ROS: no chest pain or dyspnea on exertion Gastrointestinal ROS: positive for - abdominal pain and diarrhea, bloody stools Genito-Urinary ROS: no dysuria, trouble voiding, or hematuria  EXAM: Filed Vitals:   08/11/12 0943  BP: 140/86  Pulse: 89  Temp: 98.6 F (37 C)  Resp: 16     General appearance: alert and cooperative Resp: clear to auscultation bilaterally Cardio: regular rate and rhythm GI: normal findings: no masses palpable and no scars, striae, dilated veins, rashes, or lesions and abnormal findings:  suprapubic tenderness Anal Exam Findings: small prolapsed internal hemorrhoids, enlarged and inflamed external hemorrhoids   ASSESSMENT AND PLAN:  Continue Anusol cream and soaking in warm water.  Use lidocaine cream for pain.  No straining or heavy lifting.  Given his findings of rectal bleeding and abdominal pain, I have recommended that he get a colonoscopy.  We will schedule this for about 4 weeks from now to allow his hemorrhoids to subside.  I will take another look at his hemorrhoids then.       Levi Coryell C Levi Novitski, MD Colon and Rectal Surgery / General Surgery [facility]      Visit Diagnoses: 1. Prolapsed hemorrhoids   2. Rectal bleeding     Primary Care Physician: Meyers, Levi CABOT, MD  

## 2012-09-08 NOTE — Op Note (Signed)
[facility] [address] [place], [address]   COLONOSCOPY PROCEDURE REPORT  PATIENT: Levi Meyers, Levi Meyers  MR#: [id number] BIRTHDATE: 1966-02-18 , 46  yrs. old GENDER: Male ENDOSCOPIST: Vanita Panda, MD REFERRED TK:ZSWF Phillips Odor, M.D. PROCEDURE DATE:  09/08/2012 PROCEDURE:   Colonoscopy, diagnostic ASA CLASS:   Class II INDICATIONS:Rectal Bleeding. MEDICATIONS: Fentanyl 100 mcg IV and Versed 10 mg IV  DESCRIPTION OF PROCEDURE:   After the risks benefits and alternatives of the procedure were thoroughly explained, informed consent was obtained.  The Pentax Colonoscope [id number]  endoscope was introduced through the anus and advanced to the cecum, which was identified by both the appendix and ileocecal valve , limited by No adverse events experienced.   The quality of the prep was good, using MiraLax .  The instrument was then slowly withdrawn as the colon was fully examined.  There was one small polyp noted in the ileocecal region.  This was removed completely with cold forceps.  The patient had one visualized sigmoid diverticulum.       FINDINGS:  Cecal polyp, mild diverticulosis, mild internal hemorrhoids  COMPLICATIONS: none  IMPRESSION:  cecal polyp, mildly inflamed internal hemorrhoids  RECOMMENDATIONS: repeat colonoscopy in 10 yrs, continue symptomatic medical treatment of hemorrhoids  If you continue to have rectal bleeding, return to the office for internal banding procedure   _______________________________ eSigned:  Vanita Panda, MD 09/08/2012 8:39 AM   UX:NATF Phillips Odor, MD

## 2012-09-08 NOTE — Interval H&P Note (Signed)
History and Physical Interval Note:  09/08/2012 7:29 AM  Levi Meyers  has presented today for surgery, with the diagnosis of rectal bleeding/abd.pain  The various methods of treatment have been discussed with the patient and family. After consideration of risks, benefits and other options for treatment, the patient has consented to  Procedure(s): COLONOSCOPY (N/A) as a surgical intervention .  The patient's history has been reviewed, patient examined, no change in status, stable for surgery.  I have reviewed the patient's chart and labs.  Questions were answered to the patient's satisfaction.  We discussed the risks of serious bleeding (1:5000) and perforation (1:10,000).   Vanita Panda, MD  Colorectal and General Surgery [facility]

## 2012-09-09 ENCOUNTER — Encounter (HOSPITAL_COMMUNITY): Payer: Self-pay | Admitting: General Surgery

## 2012-09-09 ENCOUNTER — Telehealth (INDEPENDENT_AMBULATORY_CARE_PROVIDER_SITE_OTHER): Payer: Self-pay | Admitting: General Surgery

## 2012-09-09 NOTE — Telephone Encounter (Signed)
Did not get answer when called this patient will try again .

## 2012-09-22 ENCOUNTER — Encounter (HOSPITAL_COMMUNITY): Payer: Self-pay | Admitting: *Deleted

## 2012-09-22 ENCOUNTER — Telehealth (INDEPENDENT_AMBULATORY_CARE_PROVIDER_SITE_OTHER): Payer: Self-pay | Admitting: *Deleted

## 2012-09-22 ENCOUNTER — Emergency Department (HOSPITAL_COMMUNITY)
Admission: EM | Admit: 2012-09-22 | Discharge: 2012-09-22 | Disposition: A | Discharge status: Home / Self Care | Payer: Medicare PPO | Attending: Emergency Medicine | Admitting: Emergency Medicine

## 2012-09-22 DIAGNOSIS — Z79899 Other long term (current) drug therapy: Secondary | ICD-10-CM | POA: Insufficient documentation

## 2012-09-22 DIAGNOSIS — Z7982 Long term (current) use of aspirin: Secondary | ICD-10-CM | POA: Insufficient documentation

## 2012-09-22 DIAGNOSIS — Z794 Long term (current) use of insulin: Secondary | ICD-10-CM | POA: Insufficient documentation

## 2012-09-22 DIAGNOSIS — Z87442 Personal history of urinary calculi: Secondary | ICD-10-CM | POA: Insufficient documentation

## 2012-09-22 DIAGNOSIS — K921 Melena: Secondary | ICD-10-CM | POA: Insufficient documentation

## 2012-09-22 DIAGNOSIS — Z87448 Personal history of other diseases of urinary system: Secondary | ICD-10-CM | POA: Insufficient documentation

## 2012-09-22 DIAGNOSIS — I10 Essential (primary) hypertension: Secondary | ICD-10-CM | POA: Insufficient documentation

## 2012-09-22 DIAGNOSIS — Z8719 Personal history of other diseases of the digestive system: Secondary | ICD-10-CM | POA: Insufficient documentation

## 2012-09-22 DIAGNOSIS — E785 Hyperlipidemia, unspecified: Secondary | ICD-10-CM | POA: Insufficient documentation

## 2012-09-22 DIAGNOSIS — Z8659 Personal history of other mental and behavioral disorders: Secondary | ICD-10-CM | POA: Insufficient documentation

## 2012-09-22 DIAGNOSIS — K648 Other hemorrhoids: Secondary | ICD-10-CM | POA: Insufficient documentation

## 2012-09-22 DIAGNOSIS — R110 Nausea: Secondary | ICD-10-CM | POA: Insufficient documentation

## 2012-09-22 DIAGNOSIS — E119 Type 2 diabetes mellitus without complications: Secondary | ICD-10-CM | POA: Insufficient documentation

## 2012-09-22 DIAGNOSIS — Z87891 Personal history of nicotine dependence: Secondary | ICD-10-CM | POA: Insufficient documentation

## 2012-09-22 DIAGNOSIS — K625 Hemorrhage of anus and rectum: Secondary | ICD-10-CM | POA: Insufficient documentation

## 2012-09-22 DIAGNOSIS — Z8709 Personal history of other diseases of the respiratory system: Secondary | ICD-10-CM | POA: Insufficient documentation

## 2012-09-22 DIAGNOSIS — R1032 Left lower quadrant pain: Secondary | ICD-10-CM | POA: Insufficient documentation

## 2012-09-22 LAB — BASIC METABOLIC PANEL
BUN: 8 mg/dL (ref 6–23)
Creatinine, Ser: 0.73 mg/dL (ref 0.50–1.35)
GFR calc Af Amer: >90 mL/min (ref >90)
GFR calc non Af Amer: >90 mL/min (ref >90)
Potassium: 4.2 mEq/L (ref 3.5–5.1)

## 2012-09-22 LAB — CBC WITH DIFFERENTIAL/PLATELET
Basophils Relative: 1 % (ref 0–1)
Eosinophils Absolute: 0.2 10*3/uL (ref 0.0–0.7)
Hemoglobin: 16.6 g/dL (ref 13.0–17.0)
MCH: 29.9 pg (ref 26.0–34.0)
MCHC: 34.8 g/dL (ref 30.0–36.0)
Monocytes Absolute: 0.7 10*3/uL (ref 0.1–1.0)
Monocytes Relative: 7 % (ref 3–12)
Neutrophils Relative %: 59 % (ref 43–77)

## 2012-09-22 LAB — ABO/RH: ABO/RH(D): O POS

## 2012-09-22 LAB — URINALYSIS, ROUTINE W REFLEX MICROSCOPIC
Bilirubin Urine: NEGATIVE
Glucose, UA: >1000 mg/dL — AB
Ketones, ur: NEGATIVE mg/dL
pH: 5.5 (ref 5.0–8.0)

## 2012-09-22 LAB — GLUCOSE, CAPILLARY: Glucose-Capillary: 334 mg/dL — ABNORMAL HIGH (ref 70–99)

## 2012-09-22 LAB — OCCULT BLOOD, POC DEVICE: Fecal Occult Bld: POSITIVE — AB

## 2012-09-22 MED ORDER — IBUPROFEN 800 MG PO TABS: 800.0000 mg | ORAL_TABLET | Freq: Three times a day (TID) | ORAL | Status: DC

## 2012-09-22 MED ORDER — SODIUM CHLORIDE 0.9 % IV SOLN
INTRAVENOUS | Status: DC
  Administered 2012-09-22: 16:00:00 via INTRAVENOUS

## 2012-09-22 MED ORDER — KETOROLAC TROMETHAMINE 30 MG/ML IJ SOLN
30.0000 mg | Freq: Once | INTRAMUSCULAR | Status: AC
  Administered 2012-09-22: 30 mg via INTRAVENOUS
  Filled 2012-09-22: qty 1

## 2012-09-22 NOTE — ED Provider Notes (Signed)
History     CSN: [id number]  Arrival date & time 09/22/12  1337   First MD Initiated Contact with Patient 09/22/12 1427      Chief Complaint  Patient presents with  . Rectal Bleeding    (Consider location/radiation/quality/duration/timing/severity/associated sxs/prior treatment) HPI Comments: Pt presents to the ED for rectal bleeding since this morning.  States he goes to the bathroom and blood is dripping out of his rectum onto the floor.  Blood is present in toilet bowl and on tissue paper. Recent colonoscopy on 09/08/12 with polyp removal.  Diverticulosis and internal hemorrhoids noted at that time.  Pt states he has felt weak today with new onset LLQ pain and nausea.  Prior episodes of prolapsed internal hemorrhoids without surgical intervention.  Denies any chest pain, SOB, or vomiting, or dizziness.  Pt is known diabetic and has not had any lantus for approx 2 weeks.  FBS this am was in the 400's.  Patient is a 47 y.o. male presenting with hematochezia. The history is provided by the patient.  Rectal Bleeding  Associated symptoms include abdominal pain and nausea.    Past Medical History  Diagnosis Date  . Diabetes mellitus   . Hyperlipidemia   . Hypertension   . GERD (gastroesophageal reflux disease)   . History of kidney stones   . Depression   . Asthma   . Kidney disease     stones    Past Surgical History  Procedure Laterality Date  . Appendectomy    . Cholecystectomy    . Leg surgery    . Strabismus surgery    . Eye surgery    . Knee cartilage surgery    . Skin graft  1974  . Colonoscopy N/A 09/08/2012    Procedure: COLONOSCOPY;  Surgeon: Romie Levee, MD;  Location: [facility];  Service: Endoscopy;  Laterality: N/A;    Family History  Problem Relation Age of Onset  . Diabetes Mother   . Heart attack Mother   . Stroke Mother   . Diabetes Father   . Heart disease Other     Parent  . Heart disease Other     Grandparents    History  Substance Use  Topics  . Smoking status: Former Games developer  . Smokeless tobacco: Current User    Types: Snuff  . Alcohol Use: No      Review of Systems  Gastrointestinal: Positive for nausea, abdominal pain, hematochezia and anal bleeding.  All other systems reviewed and are negative.    Allergies  Codeine and Victoza  Home Medications   Current Outpatient Rx  Name  Route  Sig  Dispense  Refill  . ANUSOL-HC 2.5 % rectal cream      Apply rectally 2 times daily   1 Tube   0     Dispense as written.   Marland Kitchen aspirin 81 MG tablet   Oral   Take 81 mg by mouth daily.          Marland Kitchen docusate sodium (COLACE) 100 MG capsule   Oral   Take 1 capsule (100 mg total) by mouth every 12 (twelve) hours.   60 capsule   0   . hydrocortisone (ANUSOL-HC) 2.5 % rectal cream      Apply rectally 3 times daily   30 g   0   . insulin aspart (NOVOLOG FLEXPEN) 100 UNIT/ML injection   Subcutaneous   Inject 25 Units into the skin daily. With the largest meal of the day  30 mL   12   . insulin glargine (LANTUS) 100 UNIT/ML injection   Subcutaneous   Inject 250 Units into the skin every morning.          . lidocaine (XYLOCAINE) 5 % ointment   Topical   Apply 1 application topically as needed. Apply to rectum/hemorrhoids         . lisinopril (PRINIVIL,ZESTRIL) 20 MG tablet   Oral   Take 20 mg by mouth daily.          . metoprolol tartrate (LOPRESSOR) 25 MG tablet   Oral   Take 25 mg by mouth 2 (two) times daily.          . pravastatin (PRAVACHOL) 20 MG tablet   Oral   Take 20 mg by mouth daily.         Weyman Croon Hazel (TUCKS) 50 % PADS   Apply externally   Apply 1 application topically 6 (six) times daily.   40 each   0   . glucose blood (ONE TOUCH ULTRA TEST) test strip      Use as instructed to check blood sugar twice daily dx 250.81   200 each   3   . ONETOUCH DELICA LANCETS MISC      Use as directed twice daily dx 250.81   200 each   3   . EXPIRED: promethazine (PHENERGAN)  25 MG tablet   Oral   Take 1 tablet (25 mg total) by mouth every 6 (six) hours as needed for nausea.   12 tablet   0     BP 146/92  Pulse 101  Temp(Src) 98.2 F (36.8 C) (Oral)  Resp 18  SpO2 97%  Physical Exam  Nursing note and vitals reviewed. Constitutional: He is oriented to person, place, and time. He appears well-developed and well-nourished.  HENT:  Head: Normocephalic and atraumatic.  Mouth/Throat: Oropharynx is clear and moist.  Eyes: Conjunctivae and EOM are normal.  Neck: Normal range of motion. Neck supple.  Cardiovascular: Normal rate, regular rhythm and normal heart sounds.   Pulmonary/Chest: Effort normal and breath sounds normal. He has no wheezes.  Abdominal: Soft. Normal appearance and bowel sounds are normal. There is tenderness in the left lower quadrant. There is no guarding, no CVA tenderness, no tenderness at McBurney's point and negative Murphy's sign.  Genitourinary: Rectal exam shows internal hemorrhoid and tenderness. Rectal exam shows no external hemorrhoid, no fissure and anal tone normal. Guaiac positive stool.  Musculoskeletal: Normal range of motion. He exhibits no edema.  Neurological: He is alert and oriented to person, place, and time.  Skin: Skin is warm and dry.  Psychiatric: He has a normal mood and affect.    ED Course  Procedures (including critical care time)  Labs Reviewed  CBC WITH DIFFERENTIAL - Abnormal; Notable for the following:    WBC 10.7 (*)    All other components within normal limits  BASIC METABOLIC PANEL - Abnormal; Notable for the following:    Glucose, Bld 407 (*)    All other components within normal limits  URINALYSIS, ROUTINE W REFLEX MICROSCOPIC - Abnormal; Notable for the following:    Specific Gravity, Urine 1.038 (*)    Glucose, UA >1000 (*)    All other components within normal limits  GLUCOSE, CAPILLARY - Abnormal; Notable for the following:    Glucose-Capillary 495 (*)    All other components within  normal limits  GLUCOSE, CAPILLARY - Abnormal; Notable for the following:  Glucose-Capillary 334 (*)    All other components within normal limits  OCCULT BLOOD, POC DEVICE - Abnormal; Notable for the following:    Fecal Occult Bld POSITIVE (*)    All other components within normal limits  URINE MICROSCOPIC-ADD ON  TYPE AND SCREEN  ABO/RH   No results found.   1. Rectal bleeding       MDM    47 y.o male presents to the ED for rectal bleeding.  Colonoscopy with polyp removal on 09/08/12- diverticulosis and internal hemorrhoids also noted.  H/H, blood counts and VS stable.  No active rectal bleeding while in the ED.  Most likely source for bleeding is from internal hemorrhoids.  Pt was advised by Dr. Maisie Fus that he may need surgical banding of the hemorrhoids if bleeding continued.  Pt has previously scheduled follow up with Dr. Maisie Fus on 3/14.  Encouraged him to keep this appointment. Pt ok with plan.  Continue using the lidocaine rectal cream as needed.  Given rx for few vicodin.  Return to the ED for new or worsening symptoms.       Garlon Hatchet, PA-C 09/24/12 [phone]

## 2012-09-22 NOTE — ED Notes (Signed)
Pt states that he had a colonoscopy done on Sep 08, 2012 where he also had a "polyp removed and had a hole in his colon." This was done with Dr. Maisie Fus. He was to have a follow up appt on  March 14. He started having bloody stools at home this morning. Pt is color blind and differentiate whether or not the blood is bright or dark red. Pt also c/o ABD pain and nausea.

## 2012-09-22 NOTE — Telephone Encounter (Signed)
Wife called to state that patient is having excessive amounts of blood with his bowel movements. Patient states blood running down his leg.  Patient also reported to wife severe abdominal pain.  Wife called concerned so this RN encouraged wife to have patient evaluated in the ED.  Wife agreeable at this time.

## 2012-09-22 NOTE — ED Notes (Signed)
Pt gave urine sample if needed.

## 2012-09-23 ENCOUNTER — Encounter (INDEPENDENT_AMBULATORY_CARE_PROVIDER_SITE_OTHER): Payer: Self-pay | Admitting: General Surgery

## 2012-09-23 ENCOUNTER — Ambulatory Visit (INDEPENDENT_AMBULATORY_CARE_PROVIDER_SITE_OTHER): Payer: Medicare HMO | Admitting: General Surgery

## 2012-09-23 ENCOUNTER — Telehealth (INDEPENDENT_AMBULATORY_CARE_PROVIDER_SITE_OTHER): Payer: Self-pay

## 2012-09-23 VITALS — BP 122/84 | HR 120 | Temp 97.6°F | Resp 18 | Ht 65.0 in | Wt 184.0 lb

## 2012-09-23 DIAGNOSIS — K625 Hemorrhage of anus and rectum: Secondary | ICD-10-CM

## 2012-09-23 NOTE — Telephone Encounter (Signed)
The pt called me back .  He can come today so I put him in at 3:50 where I had an opening.

## 2012-09-23 NOTE — Patient Instructions (Signed)
I do not see any active bleeding or signs of recent bleeding within your hemorrhoids.  I am not convinced that this is a hemorrhoid.  If you have abdominal cramping and bleeding again, call the office as soon as possible and we will set you up to have a tagged red blood cell scan to try to determine from where you are bleeding.

## 2012-09-23 NOTE — Telephone Encounter (Signed)
I called to check on the pt after he went to the ER yesterday.  He said he is still having the same problem and he bleeds when he has a bowel movement.  He said he isn't straining at all.  I offered him to come in and see Dr Maisie Fus this afternoon.  He needs to call the school and see if he can be here and not have to pick up his daughter.

## 2012-09-23 NOTE — Progress Notes (Signed)
Levi Meyers is a 47 y.o. male who is here for a follow up visit regarding BRBPR.  He presented to the ED yesterday with abd cramping and profuse bleeding per rectum.  His vitals and labs were stable, and he was discarged to home.    Objective: Filed Vitals:   09/23/12 1558  BP: 122/84  Pulse: 120  Temp: 97.6 F (36.4 C)  Resp: 18    General appearance: alert and cooperative . Procedure: Anoscopy Surgeon: Maisie Fus Assistant: Christella Scheuermann After the risks and benefits were explained, verbal consent was obtained for above procedure  Anesthesia: none Diagnosis: rectal bleeding Findings: no inflammation or active bleeding noted  Assessment and Plan: Levi Meyers is a 47 y.o. M who is here for hospital follow up after an episode of rectal bleeding.  Given his abd cramping and negative anal exam, I am not sure that this is hemorrhoids.  It seems to be slowing down at the moment, so I have asked him to call us quickly if it starts again, and we will get him in for a tagged red blood cell scan to attempt to localize his bleeding.   Vanita Panda, MD [facility], [place] [phone]

## 2012-09-24 ENCOUNTER — Encounter (INDEPENDENT_AMBULATORY_CARE_PROVIDER_SITE_OTHER): Payer: Self-pay | Admitting: General Surgery

## 2012-09-28 NOTE — ED Provider Notes (Signed)
Medical screening examination/treatment/procedure(s) were performed by non-physician practitioner and as supervising physician I was immediately available for consultation/collaboration.   Suzi Roots, MD 09/28/12 [phone]

## 2012-10-02 ENCOUNTER — Encounter (INDEPENDENT_AMBULATORY_CARE_PROVIDER_SITE_OTHER): Payer: Medicare HMO | Admitting: General Surgery

## 2012-12-01 ENCOUNTER — Emergency Department (HOSPITAL_COMMUNITY)
Admission: EM | Admit: 2012-12-01 | Discharge: 2012-12-01 | Disposition: A | Discharge status: Home / Self Care | Payer: Medicare PPO | Attending: Emergency Medicine | Admitting: Emergency Medicine

## 2012-12-01 ENCOUNTER — Encounter (HOSPITAL_COMMUNITY): Payer: Self-pay | Admitting: *Deleted

## 2012-12-01 DIAGNOSIS — IMO0001 Reserved for inherently not codable concepts without codable children: Secondary | ICD-10-CM | POA: Insufficient documentation

## 2012-12-01 DIAGNOSIS — Z87891 Personal history of nicotine dependence: Secondary | ICD-10-CM | POA: Insufficient documentation

## 2012-12-01 DIAGNOSIS — Z87442 Personal history of urinary calculi: Secondary | ICD-10-CM | POA: Insufficient documentation

## 2012-12-01 DIAGNOSIS — E860 Dehydration: Secondary | ICD-10-CM | POA: Insufficient documentation

## 2012-12-01 DIAGNOSIS — E1165 Type 2 diabetes mellitus with hyperglycemia: Secondary | ICD-10-CM

## 2012-12-01 DIAGNOSIS — Z8659 Personal history of other mental and behavioral disorders: Secondary | ICD-10-CM | POA: Insufficient documentation

## 2012-12-01 DIAGNOSIS — J45909 Unspecified asthma, uncomplicated: Secondary | ICD-10-CM | POA: Insufficient documentation

## 2012-12-01 DIAGNOSIS — E785 Hyperlipidemia, unspecified: Secondary | ICD-10-CM | POA: Insufficient documentation

## 2012-12-01 DIAGNOSIS — I10 Essential (primary) hypertension: Secondary | ICD-10-CM | POA: Insufficient documentation

## 2012-12-01 DIAGNOSIS — Z794 Long term (current) use of insulin: Secondary | ICD-10-CM | POA: Insufficient documentation

## 2012-12-01 DIAGNOSIS — R739 Hyperglycemia, unspecified: Secondary | ICD-10-CM

## 2012-12-01 DIAGNOSIS — Z8719 Personal history of other diseases of the digestive system: Secondary | ICD-10-CM | POA: Insufficient documentation

## 2012-12-01 LAB — COMPREHENSIVE METABOLIC PANEL
ALT: 19 U/L (ref 0–53)
AST: 17 U/L (ref 0–37)
Alkaline Phosphatase: 122 U/L — ABNORMAL HIGH (ref 39–117)
Calcium: 8.7 mg/dL (ref 8.4–10.5)
Potassium: 3.5 mEq/L (ref 3.5–5.1)
Sodium: 139 mEq/L (ref 135–145)
Total Protein: 6.4 g/dL (ref 6.0–8.3)

## 2012-12-01 LAB — URINALYSIS, ROUTINE W REFLEX MICROSCOPIC
Bilirubin Urine: NEGATIVE
Glucose, UA: >1000 mg/dL — AB
Hgb urine dipstick: NEGATIVE
Specific Gravity, Urine: 1.039 — ABNORMAL HIGH (ref 1.005–1.030)
pH: 6.5 (ref 5.0–8.0)

## 2012-12-01 LAB — CBC
MCH: 30.8 pg (ref 26.0–34.0)
MCHC: 35.6 g/dL (ref 30.0–36.0)
Platelets: 282 10*3/uL (ref 150–400)
RBC: 4.91 MIL/uL (ref 4.22–5.81)

## 2012-12-01 LAB — KETONES, QUALITATIVE: Acetone, Bld: NEGATIVE

## 2012-12-01 LAB — URINE MICROSCOPIC-ADD ON

## 2012-12-01 LAB — GLUCOSE, CAPILLARY: Glucose-Capillary: 284 mg/dL — ABNORMAL HIGH (ref 70–99)

## 2012-12-01 MED ORDER — ONDANSETRON HCL 4 MG/2ML IJ SOLN
4.0000 mg | Freq: Once | INTRAMUSCULAR | Status: AC
  Administered 2012-12-01: 4 mg via INTRAVENOUS
  Filled 2012-12-01: qty 2

## 2012-12-01 MED ORDER — ACETAMINOPHEN 325 MG PO TABS
650.0000 mg | ORAL_TABLET | ORAL | Status: AC
  Administered 2012-12-01: 650 mg via ORAL
  Filled 2012-12-01: qty 2

## 2012-12-01 MED ORDER — SODIUM CHLORIDE 0.9 % IV BOLUS (SEPSIS)
2000.0000 mL | Freq: Once | INTRAVENOUS | Status: AC
  Administered 2012-12-01: 2000 mL via INTRAVENOUS

## 2012-12-01 MED ORDER — INSULIN ASPART 100 UNIT/ML ~~LOC~~ SOLN
10.0000 [IU] | Freq: Once | SUBCUTANEOUS | Status: AC
  Administered 2012-12-01: 10 [IU] via INTRAVENOUS
  Filled 2012-12-01: qty 1

## 2012-12-01 MED ORDER — SODIUM CHLORIDE 0.9 % IV BOLUS (SEPSIS): 1000.0000 mL | Freq: Once | INTRAVENOUS | Status: DC

## 2012-12-01 NOTE — ED Notes (Signed)
  CBG 342  

## 2012-12-01 NOTE — ED Provider Notes (Signed)
History     CSN: [id number]  Arrival date & time 12/01/12  1825   First MD Initiated Contact with Patient 12/01/12 1859      Chief Complaint  Patient presents with  . Hyperglycemia    HPI Levi Meyers is a 47 y.o. male w/ PMH of DM diagnosed 2003, since then his BG has not been well controlled.  While at a baseball game, he felt had moderately severe generalized weakness, needed to sit down and EMS was called finding BG to be 498.  No change in insulin regimen, no cough, fever, dysuria.  No chest pain, abdominal pain, vomiting, diarrhea, rhinorrhea, congestion or sore throat.  Pt sees Dr. Phillips Odor who has referred him to a second endocrinologist, Dr. Theotis Burrow in [place] on 12/25/2012.  [facility] [place] endo f/u 12/25/2012  Past Medical History  Diagnosis Date  . Diabetes mellitus   . Hyperlipidemia   . Hypertension   . GERD (gastroesophageal reflux disease)   . History of kidney stones   . Depression   . Asthma   . Kidney disease     stones    Past Surgical History  Procedure Laterality Date  . Appendectomy    . Cholecystectomy    . Leg surgery    . Strabismus surgery    . Eye surgery    . Knee cartilage surgery    . Skin graft  1974  . Colonoscopy N/A 09/08/2012    Procedure: COLONOSCOPY;  Surgeon: Romie Levee, MD;  Location: [facility];  Service: Endoscopy;  Laterality: N/A;    Family History  Problem Relation Age of Onset  . Diabetes Mother   . Heart attack Mother   . Stroke Mother   . Diabetes Father   . Heart disease Other     Parent  . Heart disease Other     Grandparents    History  Substance Use Topics  . Smoking status: Former Games developer  . Smokeless tobacco: Current User    Types: Snuff  . Alcohol Use: No      Review of Systems ROS At least 10pt or greater review of systems completed and are negative except where specified in the HPI.  Allergies  Codeine and Victoza  Home Medications   Current Outpatient Rx  Name  Route  Sig   Dispense  Refill  . glucose blood (ONE TOUCH ULTRA TEST) test strip      Use as instructed to check blood sugar twice daily dx 250.81   200 each   3   . insulin glargine (LANTUS) 100 UNIT/ML injection   Subcutaneous   Inject 250 Units into the skin every morning.         Letta Pate DELICA LANCETS MISC      Use as directed twice daily dx 250.81   200 each   3     BP 122/75  Pulse 94  Temp(Src) 98.3 F (36.8 C) (Oral)  Resp 20  SpO2 94%  Physical Exam  Nursing notes reviewed.  Electronic medical record reviewed. VITAL SIGNS:   Filed Vitals:   12/01/12 2145 12/01/12 2200 12/01/12 2215 12/01/12 2230  BP: 100/65 115/73 112/80 112/76  Pulse: 83 81 80 78  Temp:      TempSrc:      Resp: 25 22 18 25   SpO2: 94% 97% 98% 95%   CONSTITUTIONAL: Awake, oriented, appears non-toxic HENT: Atraumatic, normocephalic, oral mucosa pink and moist, airway patent. Nares patent without drainage. External ears normal. EYES: Conjunctiva  clear, EOMI, PERRLA, strabismus NECK: Trachea midline, non-tender, supple CARDIOVASCULAR: Normal heart rate, Normal rhythm, No murmurs, rubs, gallops PULMONARY/CHEST: Clear to auscultation, no rhonchi, wheezes, or rales. Symmetrical breath sounds. Non-tender. ABDOMINAL: Non-distended, soft, non-tender - no rebound or guarding.  BS normal. NEUROLOGIC: Non-focal, moving all four extremities, no gross sensory or motor deficits. EXTREMITIES: No clubbing, cyanosis, or edema SKIN: Warm, Dry, No erythema, No rash  ED Course  Procedures (including critical care time)  Labs Reviewed  COMPREHENSIVE METABOLIC PANEL - Abnormal; Notable for the following:    Glucose, Bld 356 (*)    Albumin 2.9 (*)    Alkaline Phosphatase 122 (*)    All other components within normal limits  URINALYSIS, ROUTINE W REFLEX MICROSCOPIC - Abnormal; Notable for the following:    Specific Gravity, Urine 1.039 (*)    Glucose, UA >1000 (*)    Ketones, ur 15 (*)    All other components  within normal limits  GLUCOSE, CAPILLARY - Abnormal; Notable for the following:    Glucose-Capillary 342 (*)    All other components within normal limits  GLUCOSE, CAPILLARY - Abnormal; Notable for the following:    Glucose-Capillary 284 (*)    All other components within normal limits  GLUCOSE, CAPILLARY - Abnormal; Notable for the following:    Glucose-Capillary 136 (*)    All other components within normal limits  GLUCOSE, CAPILLARY - Abnormal; Notable for the following:    Glucose-Capillary 162 (*)    All other components within normal limits  CBC  KETONES, QUALITATIVE  URINE MICROSCOPIC-ADD ON   No results found.   1. Hyperglycemia without ketosis   2. Mild dehydration   3. Diabetes type 2, uncontrolled       MDM  Pt presents with hyperglycemia without ketosis, treated with fluids and insulin.  No acidosis.  Pt has h/o of insulin sliding scale use with hypoglycemia.  PT feeling better after medications in ER, has follow up with Endocrinology.  Will not modify insulin regimen at this time.  Pt glucose has improved.  No emergent conditions identified at this time. F/U with Dr. Theotis Burrow.         Jones Skene, MD 12/02/12 [phone]

## 2012-12-01 NOTE — ED Notes (Signed)
Pt at baseball field, felt weak and sat down. EMS called. Found blood sugar to be 498 on arrival. Pt alert adn oriented at that time. 18g piv inserted left a/c and 700cc fluid administered. Pt's CBG 364 on arrival to ER. Pt remains alert and oriented. Complaining of weakness. A&O x 4.

## 2012-12-01 NOTE — ED Notes (Signed)
CBG 136 

## 2012-12-22 ENCOUNTER — Encounter (HOSPITAL_BASED_OUTPATIENT_CLINIC_OR_DEPARTMENT_OTHER): Payer: Self-pay | Admitting: *Deleted

## 2012-12-22 ENCOUNTER — Emergency Department (HOSPITAL_BASED_OUTPATIENT_CLINIC_OR_DEPARTMENT_OTHER): Payer: Medicare PPO

## 2012-12-22 ENCOUNTER — Emergency Department (HOSPITAL_BASED_OUTPATIENT_CLINIC_OR_DEPARTMENT_OTHER)
Admission: EM | Admit: 2012-12-22 | Discharge: 2012-12-22 | Disposition: A | Discharge status: Home / Self Care | Payer: Medicare PPO | Attending: Emergency Medicine | Admitting: Emergency Medicine

## 2012-12-22 DIAGNOSIS — Z8659 Personal history of other mental and behavioral disorders: Secondary | ICD-10-CM | POA: Insufficient documentation

## 2012-12-22 DIAGNOSIS — J45909 Unspecified asthma, uncomplicated: Secondary | ICD-10-CM | POA: Insufficient documentation

## 2012-12-22 DIAGNOSIS — Z87442 Personal history of urinary calculi: Secondary | ICD-10-CM | POA: Insufficient documentation

## 2012-12-22 DIAGNOSIS — Z8639 Personal history of other endocrine, nutritional and metabolic disease: Secondary | ICD-10-CM | POA: Insufficient documentation

## 2012-12-22 DIAGNOSIS — Z87448 Personal history of other diseases of urinary system: Secondary | ICD-10-CM | POA: Insufficient documentation

## 2012-12-22 DIAGNOSIS — Z8719 Personal history of other diseases of the digestive system: Secondary | ICD-10-CM | POA: Insufficient documentation

## 2012-12-22 DIAGNOSIS — E119 Type 2 diabetes mellitus without complications: Secondary | ICD-10-CM | POA: Insufficient documentation

## 2012-12-22 DIAGNOSIS — Z79899 Other long term (current) drug therapy: Secondary | ICD-10-CM | POA: Insufficient documentation

## 2012-12-22 DIAGNOSIS — Z87891 Personal history of nicotine dependence: Secondary | ICD-10-CM | POA: Insufficient documentation

## 2012-12-22 DIAGNOSIS — R109 Unspecified abdominal pain: Secondary | ICD-10-CM | POA: Insufficient documentation

## 2012-12-22 DIAGNOSIS — Z862 Personal history of diseases of the blood and blood-forming organs and certain disorders involving the immune mechanism: Secondary | ICD-10-CM | POA: Insufficient documentation

## 2012-12-22 DIAGNOSIS — Z794 Long term (current) use of insulin: Secondary | ICD-10-CM | POA: Insufficient documentation

## 2012-12-22 DIAGNOSIS — I10 Essential (primary) hypertension: Secondary | ICD-10-CM | POA: Insufficient documentation

## 2012-12-22 HISTORY — DX: Patient's noncompliance with other medical treatment and regimen: Z91.19

## 2012-12-22 HISTORY — DX: Patient's noncompliance with other medical treatment and regimen due to unspecified reason: Z91.199

## 2012-12-22 LAB — URINALYSIS, ROUTINE W REFLEX MICROSCOPIC
Bilirubin Urine: NEGATIVE
Ketones, ur: NEGATIVE mg/dL
Nitrite: NEGATIVE
Urobilinogen, UA: 0.2 mg/dL (ref 0.0–1.0)

## 2012-12-22 MED ORDER — MORPHINE SULFATE 4 MG/ML IJ SOLN
4.0000 mg | Freq: Once | INTRAMUSCULAR | Status: AC
  Administered 2012-12-22: 4 mg via INTRAVENOUS
  Filled 2012-12-22: qty 1

## 2012-12-22 MED ORDER — ONDANSETRON HCL 4 MG/2ML IJ SOLN
4.0000 mg | Freq: Once | INTRAMUSCULAR | Status: AC
  Administered 2012-12-22: 4 mg via INTRAVENOUS
  Filled 2012-12-22: qty 2

## 2012-12-22 MED ORDER — KETOROLAC TROMETHAMINE 30 MG/ML IJ SOLN
30.0000 mg | Freq: Once | INTRAMUSCULAR | Status: AC
  Administered 2012-12-22: 30 mg via INTRAVENOUS
  Filled 2012-12-22: qty 1

## 2012-12-22 MED ORDER — SODIUM CHLORIDE 0.9 % IV BOLUS (SEPSIS)
1000.0000 mL | Freq: Once | INTRAVENOUS | Status: AC
  Administered 2012-12-22: 1000 mL via INTRAVENOUS

## 2012-12-22 MED ORDER — OXYCODONE-ACETAMINOPHEN 5-325 MG PO TABS: 2.0000 | ORAL_TABLET | ORAL | Status: DC | PRN

## 2012-12-22 NOTE — ED Provider Notes (Signed)
History     CSN: [id number]  Arrival date & time 12/22/12  1904   First MD Initiated Contact with Patient 12/22/12 1943      Chief Complaint  Patient presents with  . Flank Pain    (Consider location/radiation/quality/duration/timing/severity/associated sxs/prior treatment) HPI Comments: Patient with history of recurrent kidney stones.  Started last night with severe flank pain on the left that feels like prior kidney stones.  No fevers or chills.  No bowel complaints.    Patient is a 47 y.o. male presenting with flank pain. The history is provided by the patient.  Flank Pain This is a new problem. Episode onset: 24 hours ago. The problem occurs constantly. The problem has been gradually worsening. Associated symptoms include abdominal pain. Nothing aggravates the symptoms. Nothing relieves the symptoms. He has tried nothing for the symptoms.    Past Medical History  Diagnosis Date  . Diabetes mellitus   . Hyperlipidemia   . Hypertension   . GERD (gastroesophageal reflux disease)   . History of kidney stones   . Depression   . Asthma   . Kidney disease     stones    Past Surgical History  Procedure Laterality Date  . Appendectomy    . Cholecystectomy    . Leg surgery    . Strabismus surgery    . Eye surgery    . Knee cartilage surgery    . Skin graft  1974  . Colonoscopy N/A 09/08/2012    Procedure: COLONOSCOPY;  Surgeon: Romie Levee, MD;  Location: [facility];  Service: Endoscopy;  Laterality: N/A;    Family History  Problem Relation Age of Onset  . Diabetes Mother   . Heart attack Mother   . Stroke Mother   . Diabetes Father   . Heart disease Other     Parent  . Heart disease Other     Grandparents    History  Substance Use Topics  . Smoking status: Former Games developer  . Smokeless tobacco: Current User    Types: Snuff  . Alcohol Use: No      Review of Systems  Gastrointestinal: Positive for abdominal pain.  Genitourinary: Positive for flank pain.   All other systems reviewed and are negative.    Allergies  Codeine and Victoza  Home Medications   Current Outpatient Rx  Name  Route  Sig  Dispense  Refill  . glucose blood (ONE TOUCH ULTRA TEST) test strip      Use as instructed to check blood sugar twice daily dx 250.81   200 each   3   . insulin glargine (LANTUS) 100 UNIT/ML injection   Subcutaneous   Inject 250 Units into the skin every morning.         Letta Pate DELICA LANCETS MISC      Use as directed twice daily dx 250.81   200 each   3     BP 148/82  Pulse 109  Temp(Src) 98 F (36.7 C) (Oral)  Resp 16  Ht 5\' 5"  (1.651 m)  Wt 182 lb (82.555 kg)  BMI 30.29 kg/m2  SpO2 96%  Physical Exam  Nursing note and vitals reviewed. Constitutional: He is oriented to person, place, and time. He appears well-developed and well-nourished. No distress.  HENT:  Head: Normocephalic and atraumatic.  Mouth/Throat: Oropharynx is clear and moist.  Neck: Normal range of motion. Neck supple.  Cardiovascular: Normal rate and regular rhythm.   No murmur heard. Pulmonary/Chest: Effort normal and breath  sounds normal. No respiratory distress. He has no wheezes.  Abdominal: Soft. Bowel sounds are normal. He exhibits no distension. There is tenderness.  There is mild ttp in the left upper and lower quadrants.  No rebound or guarding.    Musculoskeletal: Normal range of motion. He exhibits no edema.  Neurological: He is alert and oriented to person, place, and time.  Skin: Skin is warm and dry. He is not diaphoretic.    ED Course  Procedures (including critical care time)  Labs Reviewed  URINALYSIS, ROUTINE W REFLEX MICROSCOPIC   No results found.   No diagnosis found.    MDM  The patient presents with left flank pain, but there is no evidence of a kidney stone or other acute intra-abdominal pathology.  Will discharge to home with pain meds, rest.  Return prn.        Geoffery Lyons, MD 12/22/12 [phone]

## 2012-12-22 NOTE — ED Notes (Signed)
When asked pt for urine spec, pt refused , pt then took off arm band thru it at nurse while cursing left the triage room, wife sent to get pt from parking lot , wife will let us know if they are leaving

## 2012-12-22 NOTE — ED Notes (Signed)
Pt reports left flank pain which radiates in to lower abd x 2 days

## 2012-12-22 NOTE — ED Notes (Signed)
MD at bedside. 

## 2012-12-22 NOTE — ED Notes (Signed)
Pt ambulatory to tx room with wife.

## 2012-12-22 NOTE — ED Notes (Signed)
Pt given water to promote voiding per pt request.

## 2012-12-30 ENCOUNTER — Emergency Department (HOSPITAL_COMMUNITY)
Admission: EM | Admit: 2012-12-30 | Discharge: 2012-12-30 | Disposition: A | Discharge status: Home / Self Care | Payer: Medicare PPO | Attending: Emergency Medicine | Admitting: Emergency Medicine

## 2012-12-30 ENCOUNTER — Encounter (HOSPITAL_COMMUNITY): Payer: Self-pay | Admitting: Emergency Medicine

## 2012-12-30 DIAGNOSIS — Z87891 Personal history of nicotine dependence: Secondary | ICD-10-CM | POA: Insufficient documentation

## 2012-12-30 DIAGNOSIS — Z87448 Personal history of other diseases of urinary system: Secondary | ICD-10-CM | POA: Insufficient documentation

## 2012-12-30 DIAGNOSIS — Z8719 Personal history of other diseases of the digestive system: Secondary | ICD-10-CM | POA: Insufficient documentation

## 2012-12-30 DIAGNOSIS — Z8659 Personal history of other mental and behavioral disorders: Secondary | ICD-10-CM | POA: Insufficient documentation

## 2012-12-30 DIAGNOSIS — Z862 Personal history of diseases of the blood and blood-forming organs and certain disorders involving the immune mechanism: Secondary | ICD-10-CM | POA: Insufficient documentation

## 2012-12-30 DIAGNOSIS — R739 Hyperglycemia, unspecified: Secondary | ICD-10-CM

## 2012-12-30 DIAGNOSIS — Z8639 Personal history of other endocrine, nutritional and metabolic disease: Secondary | ICD-10-CM | POA: Insufficient documentation

## 2012-12-30 DIAGNOSIS — R112 Nausea with vomiting, unspecified: Secondary | ICD-10-CM | POA: Insufficient documentation

## 2012-12-30 DIAGNOSIS — Z87442 Personal history of urinary calculi: Secondary | ICD-10-CM | POA: Insufficient documentation

## 2012-12-30 DIAGNOSIS — R7309 Other abnormal glucose: Secondary | ICD-10-CM | POA: Insufficient documentation

## 2012-12-30 DIAGNOSIS — I10 Essential (primary) hypertension: Secondary | ICD-10-CM | POA: Insufficient documentation

## 2012-12-30 DIAGNOSIS — J45909 Unspecified asthma, uncomplicated: Secondary | ICD-10-CM | POA: Insufficient documentation

## 2012-12-30 DIAGNOSIS — E1169 Type 2 diabetes mellitus with other specified complication: Secondary | ICD-10-CM | POA: Insufficient documentation

## 2012-12-30 DIAGNOSIS — Z794 Long term (current) use of insulin: Secondary | ICD-10-CM | POA: Insufficient documentation

## 2012-12-30 DIAGNOSIS — Z79899 Other long term (current) drug therapy: Secondary | ICD-10-CM | POA: Insufficient documentation

## 2012-12-30 LAB — POCT I-STAT, CHEM 8
BUN: 11 mg/dL (ref 6–23)
Calcium, Ion: 1.18 mmol/L (ref 1.12–1.23)
Chloride: 104 mEq/L (ref 96–112)
Glucose, Bld: 314 mg/dL — ABNORMAL HIGH (ref 70–99)

## 2012-12-30 LAB — GLUCOSE, CAPILLARY: Glucose-Capillary: 199 mg/dL — ABNORMAL HIGH (ref 70–99)

## 2012-12-30 MED ORDER — SODIUM CHLORIDE 0.9 % IV SOLN
1000.0000 mL | Freq: Once | INTRAVENOUS | Status: AC
  Administered 2012-12-30: 1000 mL via INTRAVENOUS

## 2012-12-30 MED ORDER — POTASSIUM CHLORIDE CRYS ER 20 MEQ PO TBCR
40.0000 meq | EXTENDED_RELEASE_TABLET | Freq: Once | ORAL | Status: AC
  Administered 2012-12-30: 40 meq via ORAL
  Filled 2012-12-30: qty 2

## 2012-12-30 MED ORDER — ONDANSETRON HCL 4 MG/2ML IJ SOLN
4.0000 mg | Freq: Once | INTRAMUSCULAR | Status: AC
  Administered 2012-12-30: 4 mg via INTRAVENOUS
  Filled 2012-12-30: qty 2

## 2012-12-30 MED ORDER — SODIUM CHLORIDE 0.9 % IV SOLN: 1000.0000 mL | INTRAVENOUS | Status: DC

## 2012-12-30 MED ORDER — ACETAMINOPHEN 325 MG PO TABS
650.0000 mg | ORAL_TABLET | Freq: Once | ORAL | Status: AC
  Administered 2012-12-30: 650 mg via ORAL
  Filled 2012-12-30: qty 2

## 2012-12-30 NOTE — ED Notes (Signed)
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

## 2012-12-30 NOTE — ED Notes (Signed)
[place], nausea, and vomiting. Current CBG 386. Pt reports starting new DM medication.  Alert and oriented.  Given 4mg  zofran PTA.  Placed on 2L oxygen

## 2012-12-30 NOTE — ED Provider Notes (Signed)
History    CSN: [id number] Arrival date & time 12/30/12  1422  First MD Initiated Contact with Patient 12/30/12 1514 PCP Lalla Brothers      Chief Complaint  Patient presents with  . Hyperglycemia    HPI Pt came to the emergency room because his blood sugars have been "crazy".  They have been high.  He saw his doctor yesterday and had his medications changed.  He was changed to 150 lantus in am and night.  Also started new diabetes medication.  His blood sugars have been high for two years.  He called EMS because they were high today and he had nausea vomiting.  He vomited once today.  No diarrhea.  NO fevers.  He had been outside fishing today in the heat when he got nauseated and vomited.  No chest pain.  He did not feel like he got overheated today.  Past Medical History  Diagnosis Date  . Diabetes mellitus   . Hyperlipidemia   . Hypertension   . GERD (gastroesophageal reflux disease)   . History of kidney stones   . Depression   . Asthma   . Kidney disease     stones  . Noncompliance with diabetes treatment     Past Surgical History  Procedure Laterality Date  . Appendectomy    . Cholecystectomy    . Leg surgery    . Strabismus surgery    . Eye surgery    . Knee cartilage surgery    . Skin graft  1974  . Colonoscopy N/A 09/08/2012    Procedure: COLONOSCOPY;  Surgeon: Romie Levee, MD;  Location: [facility];  Service: Endoscopy;  Laterality: N/A;    Family History  Problem Relation Age of Onset  . Diabetes Mother   . Heart attack Mother   . Stroke Mother   . Diabetes Father   . Heart disease Other     Parent  . Heart disease Other     Grandparents    History  Substance Use Topics  . Smoking status: Former Games developer  . Smokeless tobacco: Current User    Types: Snuff  . Alcohol Use: No      Review of Systems  All other systems reviewed and are negative.    Allergies  Byetta 10 mcg pen; Codeine; and Victoza  Home Medications   Current Outpatient Rx   Name  Route  Sig  Dispense  Refill  . albuterol (PROVENTIL HFA;VENTOLIN HFA) 108 (90 BASE) MCG/ACT inhaler   Inhalation   Inhale 2 puffs into the lungs every 6 (six) hours as needed for wheezing or shortness of breath.         Marland Kitchen ibuprofen (ADVIL,MOTRIN) 200 MG tablet   Oral   Take 800 mg by mouth every 6 (six) hours as needed for pain.         Marland Kitchen insulin glargine (LANTUS) 100 UNIT/ML injection   Subcutaneous   Inject 100 Units into the skin 2 (two) times daily.            BP 146/98  Pulse 105  Temp(Src) 98.8 F (37.1 C) (Oral)  Resp 16  SpO2 97%  Physical Exam  Nursing note and vitals reviewed. Constitutional: He appears well-developed and well-nourished. No distress.  HENT:  Head: Normocephalic and atraumatic.  Right Ear: External ear normal.  Left Ear: External ear normal.  stabismus noted  Eyes: Conjunctivae are normal. Right eye exhibits no discharge. Left eye exhibits no discharge. No scleral icterus.  Neck:  Neck supple. No tracheal deviation present.  Cardiovascular: Normal rate, regular rhythm and intact distal pulses.   Pulmonary/Chest: Effort normal and breath sounds normal. No stridor. No respiratory distress. He has no wheezes. He has no rales.  Abdominal: Soft. Bowel sounds are normal. He exhibits no distension. There is no tenderness. There is no rebound and no guarding.  Musculoskeletal: He exhibits no edema and no tenderness.  Old scarring left lower extrem  Neurological: He is alert. He has normal strength. No sensory deficit. Cranial nerve deficit:  no gross defecits noted. He exhibits normal muscle tone. He displays no seizure activity. Coordination normal.  Skin: Skin is warm and dry. No rash noted.  Psychiatric: He has a normal mood and affect.    ED Course  Procedures (including critical care time) Medications  0.9 %  sodium chloride infusion (0 mLs Intravenous Stopped 12/30/12 1748)    Followed by  0.9 %  sodium chloride infusion (0 mLs  Intravenous Stopped 12/30/12 1748)    Followed by  0.9 %  sodium chloride infusion (not administered)  ondansetron (ZOFRAN) injection 4 mg (4 mg Intravenous Given 12/30/12 1530)  acetaminophen (TYLENOL) tablet 650 mg (650 mg Oral Given 12/30/12 1743)  potassium chloride SA (K-DUR,KLOR-CON) CR tablet 40 mEq (40 mEq Oral Given 12/30/12 1742)   CBG is now 199 at 1746 Labs Reviewed  GLUCOSE, CAPILLARY - Abnormal; Notable for the following:    Glucose-Capillary 356 (*)    All other components within normal limits  GLUCOSE, CAPILLARY - Abnormal; Notable for the following:    Glucose-Capillary 199 (*)    All other components within normal limits  POCT I-STAT, CHEM 8 - Abnormal; Notable for the following:    Potassium 3.2 (*)    Glucose, Bld 314 (*)    All other components within normal limits   No results found.   1. Hyperglycemia     MDM  Patient presents with recurrent hyperglycemia. The patient is in no distress. He is no evidence of acidosis or ketosis. His blood sugar improved with hydration. I recommended he followup with his primary doctor and discuss his diabetes regimen.  At this time there does not appear to be any evidence of an acute emergency medical condition and the patient appears stable for discharge with appropriate outpatient follow up.         Celene Kras, MD 12/30/12 [phone]

## 2013-01-07 ENCOUNTER — Encounter: Payer: Self-pay | Admitting: Internal Medicine

## 2013-01-07 ENCOUNTER — Ambulatory Visit (INDEPENDENT_AMBULATORY_CARE_PROVIDER_SITE_OTHER): Payer: Medicare PPO | Admitting: Internal Medicine

## 2013-01-07 VITALS — BP 122/80 | HR 102 | Ht 65.0 in | Wt 177.8 lb

## 2013-01-07 DIAGNOSIS — R609 Edema, unspecified: Secondary | ICD-10-CM

## 2013-01-07 DIAGNOSIS — E1069 Type 1 diabetes mellitus with other specified complication: Secondary | ICD-10-CM

## 2013-01-07 DIAGNOSIS — R0602 Shortness of breath: Secondary | ICD-10-CM

## 2013-01-07 DIAGNOSIS — R0600 Dyspnea, unspecified: Secondary | ICD-10-CM

## 2013-01-07 DIAGNOSIS — E785 Hyperlipidemia, unspecified: Secondary | ICD-10-CM

## 2013-01-07 DIAGNOSIS — R600 Localized edema: Secondary | ICD-10-CM

## 2013-01-07 DIAGNOSIS — I10 Essential (primary) hypertension: Secondary | ICD-10-CM

## 2013-01-07 DIAGNOSIS — R0989 Other specified symptoms and signs involving the circulatory and respiratory systems: Secondary | ICD-10-CM

## 2013-01-07 DIAGNOSIS — F319 Bipolar disorder, unspecified: Secondary | ICD-10-CM

## 2013-01-07 DIAGNOSIS — R000 Tachycardia, unspecified: Secondary | ICD-10-CM

## 2013-01-07 DIAGNOSIS — R0609 Other forms of dyspnea: Secondary | ICD-10-CM

## 2013-01-07 DIAGNOSIS — E109 Type 1 diabetes mellitus without complications: Secondary | ICD-10-CM

## 2013-01-07 DIAGNOSIS — E10649 Type 1 diabetes mellitus with hypoglycemia without coma: Secondary | ICD-10-CM

## 2013-01-07 MED ORDER — ASPIRIN EC 81 MG PO TBEC: 81.0000 mg | DELAYED_RELEASE_TABLET | Freq: Every day | ORAL | Status: DC

## 2013-01-07 NOTE — Progress Notes (Signed)
OFFICE NOTE  Chief Complaint:  Dyspnea on exertion, bilateral edema  Primary Care Physician: Colette Ribas, MD  HPI:  Levi Meyers is a pleasant 47 year old gentleman with a history of insulin-dependent diabetes since about 2000. He's been poorly controlled, with a recent hemoglobin A1c of 13.4. He also has marked dyslipidemia with total cholesterol 260, triglycerides 875, HDL 32, and LDL could not be calculated. There is a strong family history of heart disease including mother father and grandparents. He does reports increasing shortness of breath over the past 6 months, with loud snoring and periods of apnea at night, poor rest during the day and somnolence. He often wakes up with headaches in the morning. He denies any specific chest pain but does easily fatigued he become short of breath with exertion. He is referred for cardiovascular evaluation.  PMHx:  Past Medical History  Diagnosis Date  . Diabetes mellitus   . Hyperlipidemia   . Hypertension   . GERD (gastroesophageal reflux disease)   . History of kidney stones   . Depression   . Asthma   . Kidney disease     stones  . Noncompliance with diabetes treatment     Past Surgical History  Procedure Laterality Date  . Appendectomy  2004  . Cholecystectomy  2004  . Leg surgery    . Strabismus surgery    . Eye surgery  1970  . Knee cartilage surgery    . Skin graft  1974  . Colonoscopy N/A 09/08/2012    Procedure: COLONOSCOPY;  Surgeon: Romie Levee, MD;  Location: [facility];  Service: Endoscopy;  Laterality: N/A;  . Cardiac catheterization      Dr. Daleen Squibb [facility])    FAMHx:  Family History  Problem Relation Age of Onset  . Diabetes Mother   . Heart attack Mother   . Stroke Mother   . Diabetes Father   . Heart disease Other     Parent  . Heart disease Other     Grandparents    SOCHx:   reports that he has quit smoking. His smoking use included Cigarettes. He smoked 0.00 packs per day for 4 years.  His smokeless tobacco use includes Snuff. He reports that he does not drink alcohol or use illicit drugs.  ALLERGIES:  Allergies  Allergen Reactions  . Byetta 10 Mcg Pen (Exenatide) Nausea And Vomiting  . Codeine Nausea And Vomiting  . Victoza (Liraglutide) Nausea And Vomiting    ROS: A comprehensive review of systems was negative except for: Cardiovascular: positive for dyspnea and fatigue Gastrointestinal: positive for reflux symptoms Musculoskeletal: positive for muscle weakness Endocrine: positive for diabetic symptoms including blurry vision, increased fatigue and polydipsia  HOME MEDS: Current Outpatient Prescriptions  Medication Sig Dispense Refill  . albuterol (PROVENTIL HFA;VENTOLIN HFA) 108 (90 BASE) MCG/ACT inhaler Inhale 2 puffs into the lungs every 6 (six) hours as needed for wheezing or shortness of breath.      . Canagliflozin (INVOKANA) 100 MG TABS Take 1 tablet by mouth every morning.      Marland Kitchen ibuprofen (ADVIL,MOTRIN) 200 MG tablet Take 800 mg by mouth every 6 (six) hours as needed for pain.      Marland Kitchen insulin glargine (LANTUS) 100 UNIT/ML injection Inject 100 Units into the skin 2 (two) times daily.       Marland Kitchen aspirin EC 81 MG tablet Take 1 tablet (81 mg total) by mouth daily.  90 tablet  3   No current facility-administered medications for this visit.  LABS/IMAGING: No results found for this or any previous visit (from the past 48 hour(s)). No results found.  VITALS: BP 122/80  Pulse 102  Ht 5\' 5"  (1.651 m)  Wt 177 lb 12.8 oz (80.65 kg)  BMI 29.59 kg/m2  EXAM: General appearance: alert and no distress Neck: no adenopathy, no carotid bruit, no JVD, supple, symmetrical, trachea midline and thyroid not enlarged, symmetric, no tenderness/mass/nodules, esotropia Lungs: clear to auscultation bilaterally Heart: regular rate and rhythm, S1, S2 normal, no murmur, click, rub or gallop Abdomen: soft, non-tender; bowel sounds normal; no masses,  no organomegaly and  obese Extremities: extremities normal, atraumatic, no cyanosis or edema Pulses: 2+ and symmetric Skin: Skin color, texture, turgor normal. No rashes or lesions Neurologic: Grossly normal  EKG: Sinus tachycardia at 102  ASSESSMENT: 1. Dyspnea on exertion 2. Tachycardia 3. Poorly-controlled insulin-dependent diabetes 4. Hypertension 5. Dyslipidemia 6. Strong family history of premature coronary disease  PLAN: 1.   Mr. Thurmond Butts he is having increasing shortness of breath which is concerning for possible angina or heart failure. His exam is consistent with volume overload, however he is tachycardic which can suggest resting ischemia. I would like for him to undergo a nuclear stress test and echocardiogram to evaluate his dyspnea. He does report snoring, witnessed apnea, daytime somnolence, and morning headaches concerning for sleep apnea. We will arrange for a sleep study. Plan to see him back in a few weeks to review the results of his tests. I would like him to start aspirin 81 mg daily now. Most likely will add beta blocker and/or cholesterol medication in the future. The main goal right now should be better diabetes control, I understand he is seeing a new endocrinologist.  Thank you for this kind referral.  Chrystie Nose, MD, [facility] Attending Cardiologist [facility]  HILTY,Kenneth C 01/07/2013, 1:48 PM

## 2013-01-07 NOTE — Patient Instructions (Signed)
Your physician has requested that you have an echocardiogram. Echocardiography is a painless test that uses sound waves to create images of your heart. It provides your doctor with information about the size and shape of your heart and how well your heart's chambers and valves are working. This procedure takes approximately one hour. There are no restrictions for this procedure.  Your physician has requested that you have a lexiscan myoview. For further information please visit [email or web address] Please follow instruction sheet, as given.  Your physician has recommended that you have a sleep study. This test records several body functions during sleep, including: brain activity, eye movement, oxygen and carbon dioxide blood levels, heart rate and rhythm, breathing rate and rhythm, the flow of air through your mouth and nose, snoring, body muscle movements, and chest and belly movement.   Your physician recommends that you schedule a follow-up appointment in: following your tests. Your physician has recommended you make the following change in your medication: Start  81mg  asiprin.

## 2013-01-08 ENCOUNTER — Encounter: Payer: Self-pay | Admitting: Internal Medicine

## 2013-01-12 DIAGNOSIS — G4733 Obstructive sleep apnea (adult) (pediatric): Secondary | ICD-10-CM

## 2013-01-19 ENCOUNTER — Encounter (HOSPITAL_COMMUNITY): Payer: Medicare PPO

## 2013-01-27 ENCOUNTER — Ambulatory Visit (HOSPITAL_BASED_OUTPATIENT_CLINIC_OR_DEPARTMENT_OTHER)
Admission: RE | Admit: 2013-01-27 | Discharge: 2013-01-27 | Disposition: A | Discharge status: Home / Self Care | Payer: Medicare PPO | Source: Ambulatory Visit | Attending: Internal Medicine | Admitting: Internal Medicine

## 2013-01-27 ENCOUNTER — Ambulatory Visit (HOSPITAL_COMMUNITY)
Admission: RE | Admit: 2013-01-27 | Discharge: 2013-01-27 | Disposition: A | Discharge status: Home / Self Care | Payer: Medicare PPO | Source: Ambulatory Visit | Attending: Cardiology | Admitting: Cardiology

## 2013-01-27 DIAGNOSIS — I10 Essential (primary) hypertension: Secondary | ICD-10-CM | POA: Insufficient documentation

## 2013-01-27 DIAGNOSIS — Z9861 Coronary angioplasty status: Secondary | ICD-10-CM | POA: Insufficient documentation

## 2013-01-27 DIAGNOSIS — R0602 Shortness of breath: Secondary | ICD-10-CM

## 2013-01-27 DIAGNOSIS — R0989 Other specified symptoms and signs involving the circulatory and respiratory systems: Secondary | ICD-10-CM | POA: Insufficient documentation

## 2013-01-27 DIAGNOSIS — J45909 Unspecified asthma, uncomplicated: Secondary | ICD-10-CM | POA: Insufficient documentation

## 2013-01-27 DIAGNOSIS — R5381 Other malaise: Secondary | ICD-10-CM | POA: Insufficient documentation

## 2013-01-27 DIAGNOSIS — R002 Palpitations: Secondary | ICD-10-CM

## 2013-01-27 DIAGNOSIS — R0609 Other forms of dyspnea: Secondary | ICD-10-CM | POA: Insufficient documentation

## 2013-01-27 DIAGNOSIS — E663 Overweight: Secondary | ICD-10-CM | POA: Insufficient documentation

## 2013-01-27 DIAGNOSIS — R000 Tachycardia, unspecified: Secondary | ICD-10-CM

## 2013-01-27 DIAGNOSIS — Z8249 Family history of ischemic heart disease and other diseases of the circulatory system: Secondary | ICD-10-CM | POA: Insufficient documentation

## 2013-01-27 DIAGNOSIS — Z87891 Personal history of nicotine dependence: Secondary | ICD-10-CM | POA: Insufficient documentation

## 2013-01-27 DIAGNOSIS — E119 Type 2 diabetes mellitus without complications: Secondary | ICD-10-CM | POA: Insufficient documentation

## 2013-01-27 DIAGNOSIS — Z794 Long term (current) use of insulin: Secondary | ICD-10-CM | POA: Insufficient documentation

## 2013-01-27 MED ORDER — TECHNETIUM TC 99M SESTAMIBI GENERIC - CARDIOLITE
10.6000 | Freq: Once | INTRAVENOUS | Status: AC | PRN
  Administered 2013-01-27: 11 via INTRAVENOUS

## 2013-01-27 MED ORDER — TECHNETIUM TC 99M SESTAMIBI GENERIC - CARDIOLITE
31.1000 | Freq: Once | INTRAVENOUS | Status: AC | PRN
  Administered 2013-01-27: 31.1 via INTRAVENOUS

## 2013-01-27 MED ORDER — REGADENOSON 0.4 MG/5ML IV SOLN
0.4000 mg | Freq: Once | INTRAVENOUS | Status: AC
  Administered 2013-01-27: 0.4 mg via INTRAVENOUS

## 2013-01-27 NOTE — Progress Notes (Signed)
2D Echo Performed 01/27/2013    Audriana Aldama, RCS  

## 2013-01-27 NOTE — Procedures (Addendum)
[facility] [address] [place] [address] [employer] [employer] [place] [address] [phone]  Cardiology Nuclear Med Study  Levi Meyers is a 47 y.o. male     MRN : [id number]     DOB: October 11, 1965  Procedure Date: 01/27/2013  Nuclear Med Background Indication for Stress Test:  Evaluation for Ischemia History:  Asthma and chronic bronchitis;PTCA Cardiac Risk Factors: Family History - CAD, History of Smoking, Hypertension, IDDM Type 1, Lipids, Overweight and TACHYCARDIA   Symptoms:  DOE, Fatigue and SOB   Nuclear Pre-Procedure Caffeine/Decaff Intake:  8:00pm NPO After: 6:00am   IV Site: R Forearm  IV 0.9% NS with Angio Cath:  22g  Chest Size (in):  44"  IV Started by: Emmit Pomfret, RN  Height: 5\' 5"  (1.651 m)  Cup Size: n/a  BMI:  Body mass index is 29.45 kg/(m^2). Weight:  177 lb (80.287 kg)   Tech Comments:  N/A    Nuclear Med Study 1 or 2 day study: 1 day  Stress Test Type:  Lexiscan  Order Authorizing Provider:  Zoila Shutter, MD   Resting Radionuclide: Technetium 75m Sestamibi  Resting Radionuclide Dose: 10.6 mCi   Stress Radionuclide:  Technetium 65m Sestamibi  Stress Radionuclide Dose: 31.1 mCi           Stress Protocol Rest HR: 85 Stress HR: 112  Rest BP: 133/92 Stress BP: 142/82  Exercise Time (min): n/a METS: n/a   Predicted Max HR: 173 bpm % Max HR: 64.74 bpm Rate Pressure Product: 21308  Dose of Adenosine (mg):  n/a Dose of Lexiscan: 0.4 mg  Dose of Atropine (mg): n/a Dose of Dobutamine: n/a mcg/kg/min (at max HR)  Stress Test Technologist: Esperanza Sheets, CCT Nuclear Technologist: Gonzella Lex, CNMT   Rest Procedure:  Myocardial perfusion imaging was performed at rest 45 minutes following the intravenous administration of Technetium 70m Sestamibi. Stress Procedure:  The patient received IV Lexiscan 0.4 mg over 15-seconds.  Technetium 65m Sestamibi injected at 30-seconds.  There were no significant changes with Lexiscan.   Quantitative spect images were obtained after a 45 minute delay.  Transient Ischemic Dilatation (Normal <1.22):  1.14 Lung/Heart Ratio (Normal <0.45):  0.30 QGS EDV:  79 ml QGS ESV:  30 ml LV Ejection Fraction: 62%  Signed by  Gonzella Lex, CNMT  Rest ECG: NSR with non-specific ST-T wave changes and NSR-LVH  Stress ECG: There was transient mild T-wave inversions noted post infusion, that returned to baseline within ~1 minute.  QPS Raw Data Images:  Acquisition technically good; normal left ventricular size. Stress Images:  Normal homogeneous uptake in all areas of the myocardium. Rest Images:  Normal homogeneous uptake in all areas of the myocardium. Subtraction (SDS):  There is no evidence of scar or ischemia.  Impression Exercise Capacity:  Lexiscan with no exercise. BP Response:  Normal blood pressure response. Clinical Symptoms:  No significant symptoms noted. ECG Impression:  Non-diagnostic transient Inferolateral TWI Comparison with Prior Nuclear Study: No previous nuclear study performed  Overall Impression:  Normal stress nuclear study. and Low risk stress nuclear study with no scintigraphic evidence of ischemia or infarfction..  LV Wall Motion:  NL LV Function; NL Wall Motion   HARDING,DAVID W, MD  01/27/2013 6:54 PM

## 2013-02-01 ENCOUNTER — Telehealth: Payer: Self-pay | Admitting: Internal Medicine

## 2013-02-01 NOTE — Telephone Encounter (Signed)
Cx appointment because he does not want to pay money to come see the doctor for test results

## 2013-02-02 ENCOUNTER — Ambulatory Visit: Payer: Medicare PPO | Admitting: Internal Medicine

## 2013-02-05 DIAGNOSIS — G4733 Obstructive sleep apnea (adult) (pediatric): Secondary | ICD-10-CM

## 2013-02-18 ENCOUNTER — Ambulatory Visit: Payer: Medicare PPO | Admitting: Internal Medicine

## 2013-02-18 ENCOUNTER — Ambulatory Visit (INDEPENDENT_AMBULATORY_CARE_PROVIDER_SITE_OTHER): Payer: Medicare PPO | Admitting: Internal Medicine

## 2013-02-18 ENCOUNTER — Telehealth: Payer: Self-pay | Admitting: Cardiovascular Disease

## 2013-02-18 ENCOUNTER — Encounter: Payer: Self-pay | Admitting: Internal Medicine

## 2013-02-18 VITALS — BP 148/100 | HR 68 | Ht 65.0 in | Wt 197.0 lb

## 2013-02-18 DIAGNOSIS — Z9989 Dependence on other enabling machines and devices: Secondary | ICD-10-CM

## 2013-02-18 DIAGNOSIS — R0989 Other specified symptoms and signs involving the circulatory and respiratory systems: Secondary | ICD-10-CM

## 2013-02-18 DIAGNOSIS — R0600 Dyspnea, unspecified: Secondary | ICD-10-CM

## 2013-02-18 DIAGNOSIS — G4733 Obstructive sleep apnea (adult) (pediatric): Secondary | ICD-10-CM

## 2013-02-18 DIAGNOSIS — E785 Hyperlipidemia, unspecified: Secondary | ICD-10-CM

## 2013-02-18 DIAGNOSIS — I10 Essential (primary) hypertension: Secondary | ICD-10-CM

## 2013-02-18 MED ORDER — LISINOPRIL-HYDROCHLOROTHIAZIDE 20-12.5 MG PO TABS: 1.0000 | ORAL_TABLET | Freq: Every day | ORAL | Status: DC

## 2013-02-18 NOTE — Progress Notes (Signed)
OFFICE NOTE  Chief Complaint:  Dyspnea on exertion, bilateral edema  Primary Care Physician: Colette Ribas, MD  HPI:  Levi Meyers is a pleasant 47 year old gentleman with a history of insulin-dependent diabetes since about 2000. He's been poorly controlled, with a recent hemoglobin A1c of 13.4. He also has marked dyslipidemia with total cholesterol 260, triglycerides 875, HDL 32, and LDL could not be calculated. There is a strong family history of heart disease including mother father and grandparents. He does reports increasing shortness of breath over the past 6 months, with loud snoring and periods of apnea at night, poor rest during the day and somnolence. He often wakes up with headaches in the morning. He denies any specific chest pain but does easily fatigued he become short of breath with exertion. Based on his symptoms I recommended a stress test and echocardiogram. Those were both performed demonstrating no or personable ischemia and a preserved EF. There was mild diastolic dysfunction but no significant valvular heart disease. His main issues seem to be with poor sleep at night and waking up with headaches in the morning as well as daytime fatigue. He was sent for a sleep study which was positive for sleep apnea. He had a AHI between 13 and 35 events per hour. He was scheduled for a CPAP at 11 cmH20. This will be fitted hopefully this week.  Blood pressure remains high.  PMHx:  Past Medical History  Diagnosis Date  . Diabetes mellitus   . Hyperlipidemia   . Hypertension   . GERD (gastroesophageal reflux disease)   . History of kidney stones   . Depression   . Asthma   . Kidney disease     stones  . Noncompliance with diabetes treatment     Past Surgical History  Procedure Laterality Date  . Appendectomy  2004  . Cholecystectomy  2004  . Leg surgery    . Strabismus surgery    . Eye surgery  1970  . Knee cartilage surgery    . Skin graft  1974  . Colonoscopy N/A  09/08/2012    Procedure: COLONOSCOPY;  Surgeon: Romie Levee, MD;  Location: [facility];  Service: Endoscopy;  Laterality: N/A;  . Cardiac catheterization      Dr. Daleen Squibb [facility])    FAMHx:  Family History  Problem Relation Age of Onset  . Diabetes Mother   . Heart attack Mother   . Stroke Mother   . Diabetes Father   . Heart disease Other     Parent  . Heart disease Other     Grandparents    SOCHx:   reports that he has quit smoking. His smoking use included Cigarettes. He smoked 0.00 packs per day for 4 years. His smokeless tobacco use includes Snuff. He reports that he does not drink alcohol or use illicit drugs.  ALLERGIES:  Allergies  Allergen Reactions  . Byetta 10 Mcg Pen (Exenatide) Nausea And Vomiting  . Codeine Nausea And Vomiting  . Victoza (Liraglutide) Nausea And Vomiting    ROS: A comprehensive review of systems was negative except for: Cardiovascular: positive for dyspnea and fatigue Gastrointestinal: positive for reflux symptoms Musculoskeletal: positive for muscle weakness Endocrine: positive for diabetic symptoms including blurry vision, increased fatigue and polydipsia  HOME MEDS: Current Outpatient Prescriptions  Medication Sig Dispense Refill  . albuterol (PROVENTIL HFA;VENTOLIN HFA) 108 (90 BASE) MCG/ACT inhaler Inhale 2 puffs into the lungs every 6 (six) hours as needed for wheezing or shortness of breath.      Marland Kitchen  aspirin EC 81 MG tablet Take 1 tablet (81 mg total) by mouth daily.  90 tablet  3  . Canagliflozin (INVOKANA) 100 MG TABS Take 1 tablet by mouth every morning.      Marland Kitchen ibuprofen (ADVIL,MOTRIN) 200 MG tablet Take 800 mg by mouth every 6 (six) hours as needed for pain.      Marland Kitchen insulin glargine (LANTUS) 100 UNIT/ML injection Inject 60 Units into the skin 2 (two) times daily.       Marland Kitchen lisinopril-hydrochlorothiazide (PRINZIDE,ZESTORETIC) 20-12.5 MG per tablet Take 1 tablet by mouth daily.  30 tablet  11   No current facility-administered  medications for this visit.    LABS/IMAGING: No results found for this or any previous visit (from the past 48 hour(s)). No results found.  VITALS: BP 148/100  Pulse 68  Ht 5\' 5"  (1.651 m)  Wt 197 lb (89.359 kg)  BMI 32.78 kg/m2  EXAM: deferred  EKG: deferred  ASSESSMENT: 1. Dyspnea on exertion 2. OSA 3. Poorly-controlled insulin-dependent diabetes 4. Hypertension 5. Dyslipidemia 6. Strong family history of premature coronary disease  PLAN: 1.   Levi Meyers had negative nuclear stress test and an echocardiogram which showed preserved systolic function. There is mild diastolic dysfunction and a sleep study was positive for sleep apnea. He will be arranged for a CPAP machine which should significantly improve his quality of life. He still has persistent hypertension. I think the benefit as he is a diabetic from being on an ACE inhibitor/diuretic combination. He has had some lower extremity edema as well. Will start lisinopril HCTZ 20/12.5 mg daily and plan a followup appointment with Belenda Cruise our pharmacist for blood pressure check in 2 weeks.  Thank you for this kind referral.  Chrystie Nose, MD, [facility] Attending Cardiologist [facility]  Levi Meyers 02/18/2013, 9:06 AM

## 2013-02-18 NOTE — Telephone Encounter (Signed)
Mir Fullilove was referred to [employer] as his DME Company-this was sent over on 02-15-13

## 2013-02-18 NOTE — Patient Instructions (Addendum)
Your physician has recommended you make the following change in your medication: START Lisinopril-HCTZ 20-12.5mg  once daily.  This has been sent to your pharmacy.   Dr Rennis Golden would like you to come back in 2 weeks for a Blood Pressure check with Baxter Hire.   We will be working on getting in contact with someone at [facility] about your CPAP.   Follow up with Dr. Rennis Golden in 6 months

## 2013-03-05 ENCOUNTER — Encounter: Payer: Medicare PPO | Admitting: Pharmacist Clinician (PhC)/ Clinical Pharmacy Specialist

## 2013-04-01 ENCOUNTER — Telehealth: Payer: Self-pay | Admitting: Internal Medicine

## 2013-04-01 NOTE — Telephone Encounter (Signed)
Please call asap-need to know where he is suppose to be going to get his sleep machine.He only have 15 minutes to get there.

## 2013-04-01 NOTE — Telephone Encounter (Signed)
Returned call.  Unable to leave message as voicemail keeps repeating message to record after the tone.  Will await call back from pt.  Pt will need to contact [facility] at [phone] to find out where referred.

## 2013-05-04 ENCOUNTER — Encounter (HOSPITAL_BASED_OUTPATIENT_CLINIC_OR_DEPARTMENT_OTHER): Payer: Self-pay | Admitting: Emergency Medicine

## 2013-05-04 ENCOUNTER — Emergency Department (HOSPITAL_BASED_OUTPATIENT_CLINIC_OR_DEPARTMENT_OTHER)
Admission: EM | Admit: 2013-05-04 | Discharge: 2013-05-04 | Disposition: A | Discharge status: Home / Self Care | Payer: Medicare PPO | Attending: Emergency Medicine | Admitting: Emergency Medicine

## 2013-05-04 ENCOUNTER — Emergency Department (HOSPITAL_BASED_OUTPATIENT_CLINIC_OR_DEPARTMENT_OTHER): Payer: Medicare PPO

## 2013-05-04 DIAGNOSIS — Z7982 Long term (current) use of aspirin: Secondary | ICD-10-CM | POA: Insufficient documentation

## 2013-05-04 DIAGNOSIS — Z9119 Patient's noncompliance with other medical treatment and regimen: Secondary | ICD-10-CM | POA: Insufficient documentation

## 2013-05-04 DIAGNOSIS — Z8719 Personal history of other diseases of the digestive system: Secondary | ICD-10-CM | POA: Insufficient documentation

## 2013-05-04 DIAGNOSIS — Z91199 Patient's noncompliance with other medical treatment and regimen due to unspecified reason: Secondary | ICD-10-CM | POA: Insufficient documentation

## 2013-05-04 DIAGNOSIS — I10 Essential (primary) hypertension: Secondary | ICD-10-CM | POA: Insufficient documentation

## 2013-05-04 DIAGNOSIS — Z9889 Other specified postprocedural states: Secondary | ICD-10-CM | POA: Insufficient documentation

## 2013-05-04 DIAGNOSIS — R197 Diarrhea, unspecified: Secondary | ICD-10-CM | POA: Insufficient documentation

## 2013-05-04 DIAGNOSIS — Z794 Long term (current) use of insulin: Secondary | ICD-10-CM | POA: Insufficient documentation

## 2013-05-04 DIAGNOSIS — R5381 Other malaise: Secondary | ICD-10-CM | POA: Insufficient documentation

## 2013-05-04 DIAGNOSIS — J069 Acute upper respiratory infection, unspecified: Secondary | ICD-10-CM

## 2013-05-04 DIAGNOSIS — Z87891 Personal history of nicotine dependence: Secondary | ICD-10-CM | POA: Insufficient documentation

## 2013-05-04 DIAGNOSIS — Z87442 Personal history of urinary calculi: Secondary | ICD-10-CM | POA: Insufficient documentation

## 2013-05-04 DIAGNOSIS — Z79899 Other long term (current) drug therapy: Secondary | ICD-10-CM | POA: Insufficient documentation

## 2013-05-04 DIAGNOSIS — H9209 Otalgia, unspecified ear: Secondary | ICD-10-CM | POA: Insufficient documentation

## 2013-05-04 DIAGNOSIS — R51 Headache: Secondary | ICD-10-CM | POA: Insufficient documentation

## 2013-05-04 DIAGNOSIS — R112 Nausea with vomiting, unspecified: Secondary | ICD-10-CM | POA: Insufficient documentation

## 2013-05-04 DIAGNOSIS — R072 Precordial pain: Secondary | ICD-10-CM | POA: Insufficient documentation

## 2013-05-04 DIAGNOSIS — Z8659 Personal history of other mental and behavioral disorders: Secondary | ICD-10-CM | POA: Insufficient documentation

## 2013-05-04 DIAGNOSIS — E119 Type 2 diabetes mellitus without complications: Secondary | ICD-10-CM

## 2013-05-04 DIAGNOSIS — J45909 Unspecified asthma, uncomplicated: Secondary | ICD-10-CM | POA: Insufficient documentation

## 2013-05-04 LAB — URINALYSIS, ROUTINE W REFLEX MICROSCOPIC
Bilirubin Urine: NEGATIVE
Glucose, UA: >1000 mg/dL — AB
Hgb urine dipstick: NEGATIVE
Ketones, ur: 15 mg/dL — AB
Leukocytes, UA: NEGATIVE
Nitrite: NEGATIVE
Protein, ur: NEGATIVE mg/dL
Specific Gravity, Urine: 1.039 — ABNORMAL HIGH (ref 1.005–1.030)
Urobilinogen, UA: 0.2 mg/dL (ref 0.0–1.0)
pH: 6.5 (ref 5.0–8.0)

## 2013-05-04 LAB — COMPREHENSIVE METABOLIC PANEL
AST: 11 U/L (ref 0–37)
Albumin: 3.7 g/dL (ref 3.5–5.2)
Alkaline Phosphatase: 102 U/L (ref 39–117)
BUN: 12 mg/dL (ref 6–23)
Calcium: 10.2 mg/dL (ref 8.4–10.5)
Creatinine, Ser: 1.3 mg/dL (ref 0.50–1.35)
GFR calc Af Amer: 74 mL/min — ABNORMAL LOW (ref >90)
Total Protein: 7.4 g/dL (ref 6.0–8.3)

## 2013-05-04 LAB — CBC WITH DIFFERENTIAL/PLATELET
Basophils Absolute: 0.1 10*3/uL (ref 0.0–0.1)
Basophils Relative: 1 % (ref 0–1)
Eosinophils Absolute: 0.1 10*3/uL (ref 0.0–0.7)
Eosinophils Relative: 1 % (ref 0–5)
HCT: 43.9 % (ref 39.0–52.0)
MCH: 30 pg (ref 26.0–34.0)
MCHC: 34.9 g/dL (ref 30.0–36.0)
MCV: 86.1 fL (ref 78.0–100.0)
Monocytes Absolute: 0.8 10*3/uL (ref 0.1–1.0)
Neutro Abs: 7.1 10*3/uL (ref 1.7–7.7)
RDW: 13.4 % (ref 11.5–15.5)

## 2013-05-04 LAB — URINE MICROSCOPIC-ADD ON

## 2013-05-04 MED ORDER — INSULIN GLARGINE 100 UNIT/ML ~~LOC~~ SOLN
SUBCUTANEOUS | Status: AC
  Filled 2013-05-04: qty 10

## 2013-05-04 MED ORDER — INSULIN GLARGINE 100 UNIT/ML ~~LOC~~ SOLN
60.0000 [IU] | Freq: Once | SUBCUTANEOUS | Status: AC
  Administered 2013-05-04: 60 [IU] via SUBCUTANEOUS

## 2013-05-04 MED ORDER — HYDROCHLOROTHIAZIDE 25 MG PO TABS
12.5000 mg | ORAL_TABLET | Freq: Once | ORAL | Status: AC
  Administered 2013-05-04: 12.5 mg via ORAL
  Filled 2013-05-04: qty 1

## 2013-05-04 MED ORDER — LISINOPRIL 10 MG PO TABS
20.0000 mg | ORAL_TABLET | Freq: Once | ORAL | Status: AC
  Administered 2013-05-04: 20 mg via ORAL
  Filled 2013-05-04: qty 2

## 2013-05-04 NOTE — ED Provider Notes (Signed)
CSN: [id number]     Arrival date & time 05/04/13  1528 History   First MD Initiated Contact with Patient 05/04/13 1603     Chief Complaint  Patient presents with  . Cough    HPI  Levi Meyers is a 47 y.o. male with a PMH of DM, HTN, HLD, GERD, hx of kidney stones, depression, asthma who presents to the ED for evaluation of multiple complaints.  History was provided by the patient.  States that he's been "sick" for the past 5-6 days with a cough, rhinorrhea, diarrhea, myalgias, emesis, fatigue, generalized weakness, generalized headache, and right ear pain.  Cough is productive with green sputum.  No hemoptysis. No SOB or wheezing.  He states he has focal mid-sternal aching chest pain only with coughing at no chest pain at rest/breathing. He states he has a "scratchy throat" but denies any sore throat.  He states his diarrhea is brown and loose without hematochezia. Patient had one episode of emesis. No hematemesis. No fever, chills, change in appetite/activity, abdominal pain, neck pain, back pain, dizziness, lightheadedness.  No known sick contacts.  He has not tried anything to treat his symptoms.  He is concerned [place] a lot of his time. He is out "all of medications."  He last took his medications yesterday. He is getting his medications refilled tomorrow and just "needs his medications tonight."  No hx of clotting/FH of clotting, DVT, calf tenderness/edema, recent travel, immobilization, hx of cancer.       Past Medical History  Diagnosis Date  . Diabetes mellitus   . Hyperlipidemia   . Hypertension   . GERD (gastroesophageal reflux disease)   . History of kidney stones   . Depression   . Asthma   . Kidney disease     stones  . Noncompliance with diabetes treatment    Past Surgical History  Procedure Laterality Date  . Appendectomy  2004  . Cholecystectomy  2004  . Leg surgery    . Strabismus surgery    . Eye surgery  1970  . Knee  cartilage surgery    . Skin graft  1974  . Colonoscopy N/A 09/08/2012    Procedure: COLONOSCOPY;  Surgeon: Romie Levee, MD;  Location: [facility];  Service: Endoscopy;  Laterality: N/A;  . Cardiac catheterization      Dr. Daleen Squibb ([facility])   Family History  Problem Relation Age of Onset  . Diabetes Mother   . Heart attack Mother   . Stroke Mother   . Diabetes Father   . Heart disease Other     Parent  . Heart disease Other     Grandparents   History  Substance Use Topics  . Smoking status: Former Smoker -- 4 years    Types: Cigarettes  . Smokeless tobacco: Current User    Types: Snuff  . Alcohol Use: No    Review of Systems  Constitutional: Positive for fatigue. Negative for fever, chills, diaphoresis, activity change and appetite change.  HENT: Positive for congestion, ear pain and rhinorrhea. Negative for ear discharge, hearing loss, mouth sores, postnasal drip, sinus pressure, sore throat, tinnitus and trouble swallowing.   Eyes: Negative for photophobia and visual disturbance.  Respiratory: Positive for cough. Negative for shortness of breath and wheezing.   Cardiovascular: Positive for chest pain (See HPI). Negative for leg swelling.  Gastrointestinal: Positive for nausea, vomiting and diarrhea. Negative for abdominal pain, constipation and blood in stool.  Genitourinary: Negative for dysuria and hematuria.  Musculoskeletal: Positive for myalgias. Negative for back pain and gait problem.  Skin: Negative for wound.  Neurological: Positive for weakness (generalized ) and headaches. Negative for dizziness, syncope and light-headedness.    Allergies  Byetta 10 mcg pen; Codeine; and Victoza  Home Medications   Current Outpatient Rx  Name  Route  Sig  Dispense  Refill  . albuterol (PROVENTIL HFA;VENTOLIN HFA) 108 (90 BASE) MCG/ACT inhaler   Inhalation   Inhale 2 puffs into the lungs every 6 (six) hours as needed for wheezing or shortness of breath.         Marland Kitchen aspirin  EC 81 MG tablet   Oral   Take 1 tablet (81 mg total) by mouth daily.   90 tablet   3   . Canagliflozin (INVOKANA) 100 MG TABS   Oral   Take 1 tablet by mouth every morning.         Marland Kitchen ibuprofen (ADVIL,MOTRIN) 200 MG tablet   Oral   Take 800 mg by mouth every 6 (six) hours as needed for pain.         Marland Kitchen insulin glargine (LANTUS) 100 UNIT/ML injection   Subcutaneous   Inject 60 Units into the skin 2 (two) times daily.          Marland Kitchen lisinopril-hydrochlorothiazide (PRINZIDE,ZESTORETIC) 20-12.5 MG per tablet   Oral   Take 1 tablet by mouth daily.   30 tablet   11    BP 137/100  Pulse 101  Temp(Src) 98.2 F (36.8 C) (Oral)  Resp 20  Ht 5\' 5"  (1.651 m)  Wt 183 lb (83.008 kg)  BMI 30.45 kg/m2  SpO2 98%  Filed Vitals:   05/04/13 1534 05/04/13 1806  BP: 137/100 124/92  Pulse: 101 97  Temp: 98.2 F (36.8 C)   TempSrc: Oral   Resp: 20 18  Height: 5\' 5"  (1.651 m)   Weight: 183 lb (83.008 kg)   SpO2: 98% 96%    Physical Exam  Nursing note and vitals reviewed. Constitutional: He is oriented to person, place, and time. He appears well-developed and well-nourished. No distress.  HENT:  Head: Normocephalic and atraumatic.  Right Ear: External ear normal.  Left Ear: External ear normal.  Nose: Nose normal.  Mouth/Throat: Oropharynx is clear and moist. No oropharyngeal exudate.  TM's gray and translucent bilaterally. Uvula midline. No trismus. Rhinorrhea.   Eyes: Conjunctivae and EOM are normal. Pupils are equal, round, and reactive to light. Right eye exhibits no discharge. Left eye exhibits no discharge.  Neck: Normal range of motion. Neck supple.  Cardiovascular: Normal rate, regular rhythm, normal heart sounds and intact distal pulses.  Exam reveals no gallop and no friction rub.   No murmur heard. Dorsalis pedis pulses present bilaterally  Pulmonary/Chest: Effort normal and breath sounds normal. No respiratory distress. He has no wheezes. He has no rales. He exhibits  tenderness.  Mid-sternal tenderness to palpation. Patient coughing throughout exam.   Abdominal: Soft. Bowel sounds are normal. He exhibits no distension and no mass. There is no tenderness. There is no rebound and no guarding.  Musculoskeletal: Normal range of motion. He exhibits no edema and no tenderness.  No pedal edema or calf tenderness bilaterally  Neurological: He is alert and oriented to person, place, and time.  Skin: Skin is warm and dry. He is not diaphoretic.    ED Course  Procedures (including critical care time) Labs Review Labs Reviewed - No data to display  Imaging Review No results found.  EKG Interpretation   None      Results for orders placed during the hospital encounter of 05/04/13  CBC WITH DIFFERENTIAL      Result Value Range   WBC 10.6 (*) 4.0 - 10.5 K/uL   RBC 5.10  4.22 - 5.81 MIL/uL   Hemoglobin 15.3  13.0 - 17.0 g/dL   HCT 14.7  82.9 - 56.2 %   MCV 86.1  78.0 - 100.0 fL   MCH 30.0  26.0 - 34.0 pg   MCHC 34.9  30.0 - 36.0 g/dL   RDW 13.0  86.5 - 78.4 %   Platelets 291  150 - 400 K/uL   Neutrophils Relative % 66  43 - 77 %   Neutro Abs 7.1  1.7 - 7.7 K/uL   Lymphocytes Relative 25  12 - 46 %   Lymphs Abs 2.6  0.7 - 4.0 K/uL   Monocytes Relative 7  3 - 12 %   Monocytes Absolute 0.8  0.1 - 1.0 K/uL   Eosinophils Relative 1  0 - 5 %   Eosinophils Absolute 0.1  0.0 - 0.7 K/uL   Basophils Relative 1  0 - 1 %   Basophils Absolute 0.1  0.0 - 0.1 K/uL  COMPREHENSIVE METABOLIC PANEL      Result Value Range   Sodium 137  135 - 145 mEq/L   Potassium 3.5  3.5 - 5.1 mEq/L   Chloride 99  96 - 112 mEq/L   CO2 25  19 - 32 mEq/L   Glucose, Bld 396 (*) 70 - 99 mg/dL   BUN 12  6 - 23 mg/dL   Creatinine, Ser 6.96  0.50 - 1.35 mg/dL   Calcium 29.5  8.4 - 28.4 mg/dL   Total Protein 7.4  6.0 - 8.3 g/dL   Albumin 3.7  3.5 - 5.2 g/dL   AST 11  0 - 37 U/L   ALT 19  0 - 53 U/L   Alkaline Phosphatase 102  39 - 117 U/L   Total Bilirubin 0.3  0.3 - 1.2 mg/dL    GFR calc non Af Amer 64 (*) >90 mL/min   GFR calc Af Amer 74 (*) >90 mL/min  URINALYSIS, ROUTINE W REFLEX MICROSCOPIC      Result Value Range   Color, Urine YELLOW  YELLOW   APPearance CLEAR  CLEAR   Specific Gravity, Urine 1.039 (*) 1.005 - 1.030   pH 6.5  5.0 - 8.0   Glucose, UA >1000 (*) NEGATIVE mg/dL   Hgb urine dipstick NEGATIVE  NEGATIVE   Bilirubin Urine NEGATIVE  NEGATIVE   Ketones, ur 15 (*) NEGATIVE mg/dL   Protein, ur NEGATIVE  NEGATIVE mg/dL   Urobilinogen, UA 0.2  0.0 - 1.0 mg/dL   Nitrite NEGATIVE  NEGATIVE   Leukocytes, UA NEGATIVE  NEGATIVE  URINE MICROSCOPIC-ADD ON      Result Value Range   Squamous Epithelial / LPF RARE  RARE   Bacteria, UA RARE  RARE   DG Chest 2 View (Final result)  Result time: 05/04/13 16:42:13    Final result by Rad Results In Interface (05/04/13 16:42:13)    Narrative:   CLINICAL DATA: Cough and congestion.  EXAM: CHEST 2 VIEW  COMPARISON: 11/21/2011  FINDINGS: The heart size and mediastinal contours are within normal limits. Both lungs are clear. The visualized skeletal structures are unremarkable.  IMPRESSION: No active cardiopulmonary disease.   Electronically Signed By: Meriel Pica.D.  On: 05/04/2013 16:42          MDM   1. URI (upper respiratory infection)   2. Diabetes mellitus     Levi Meyers is a 47 y.o. male with a PMH of DM, HTN, HLD, GERD, hx of kidney stones, depression, asthma who presents to the ED for evaluation of multiple complaints.  Chest x-ray, CBC, CMP, UA ordered to further evaluate. Home medications given (HCTZ, insulin, and lisinopril).     Rechecks  7:00 PM = Patient sitting up watching TV with family.  States he feels better.  Anxious to be discharged.    Etiology of multiple complaints is likely due to a URI.  Patients chest x-ray was negative for an acute cardiopulmonary process.  He was afebrile in the ED and non-toxic in appearance. Patient was encouraged to use OTC medications for  symptomatic relief.  He was given his home daily medications in the ED, which is out of.  He was encouraged to pick up his refills tomorrow. Wells score 0. PERC negative. No chest pain at rest. He had chest pain only with coughing which was reproducible. Patient was instructed to return to the ED if they experience any chest pain at rest, SOB, fever, repeated vomiting, coughing up blood, or other concerns.  He was instructed to follow-up with his PCP for further evaluation and management. Patient was in agreement with discharge and plan.     Final impressions: 1. URI  2. Diabetes mellitus     Luiz Iron PA-C        Jillyn Ledger, PA-C 05/06/13 1139

## 2013-05-04 NOTE — ED Notes (Signed)
Pt. Reports he has been sick since Friday with a cough and "feeling bad", diarrhea and emesis x 1.  Pt. Reports he does not work and is home with wife and daughter and niece and none have been sick.  Pt. Reports no flu shot and never takes it.

## 2013-05-04 NOTE — ED Notes (Signed)
Pt ambulating independently w/ steady gait on d/c in no acute distress, A&Ox4.D/c instructions reviewed w/ pt and family - pt and family deny any further questions or concerns at present.  

## 2013-05-04 NOTE — ED Notes (Signed)
Cough and congestion x x 6 days,  Denies fever,  Has not taken any otc meds

## 2013-05-05 IMAGING — CT CT ANGIO CHEST
2 of 6 series · 19 of 46 positions shown · IV contrast (APPLIED)
Comparison: Chest radiographs from the same day and earlier. CT
abdomen and pelvis 11/29/2008 and earlier.

CLINICAL DATA: 45-year-old male with chest pain, shortness of
breath, nausea.

CT ANGIOGRAPHY CHEST
TECHNIQUE: Multidetector CT imaging of the chest using the
standard protocol during bolus administration of intravenous
contrast. Multiplanar reconstructed images including MIPs were
obtained and reviewed to evaluate the vascular anatomy.
Contrast:  100 ml [id number].

[Series 8: pulm embolism 1.0 b25f thin · axial · 0.86mm/px · z∈[-277,-17]mm · 16 of 286 slices shown]
[im 13/286  lung]
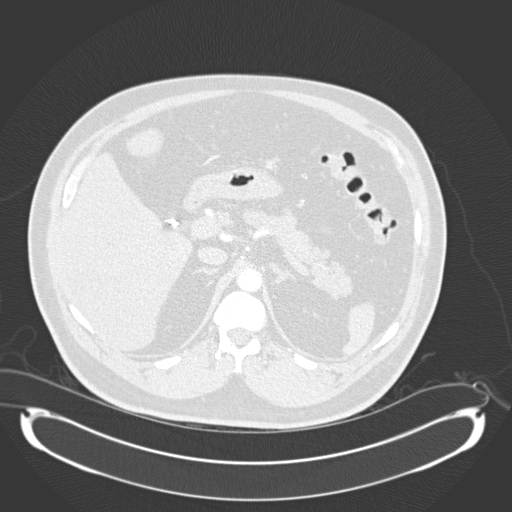
[im 38/286  soft-tissue]
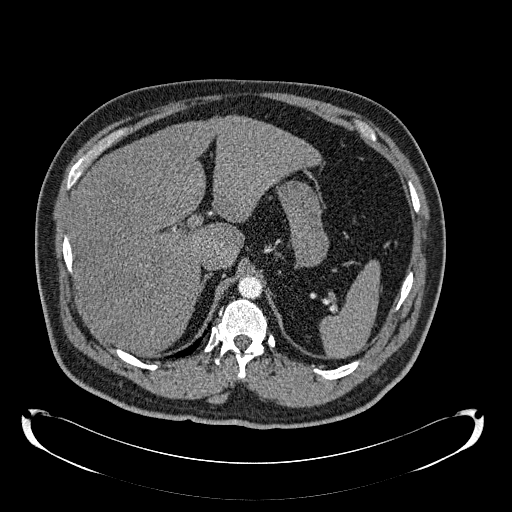
[im 50/286  lung]
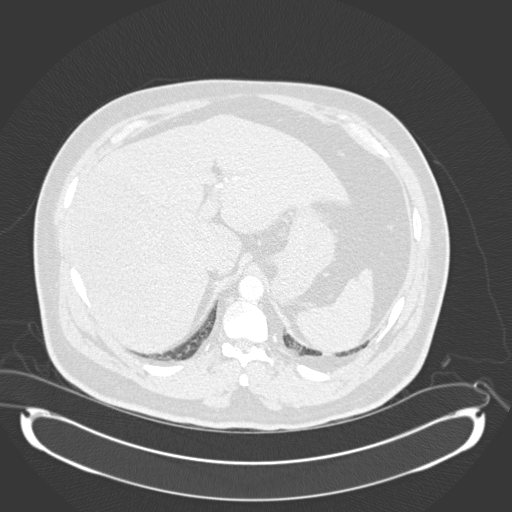
[im 62/286  soft-tissue]
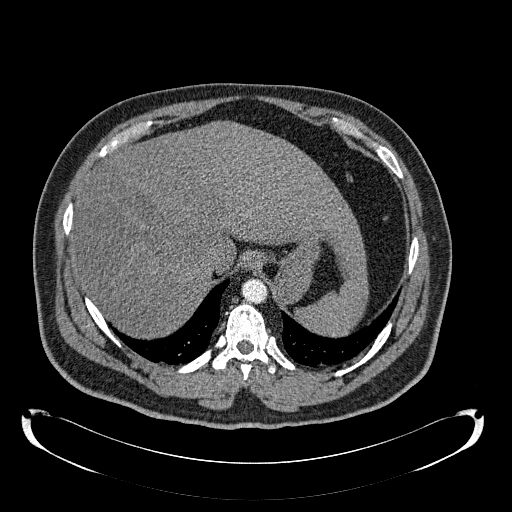
[im 87/286  lung]
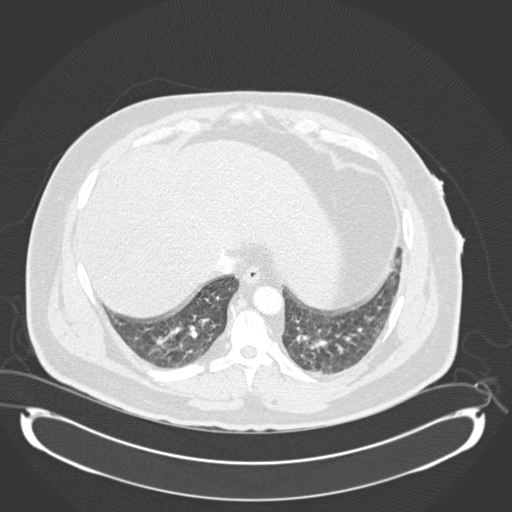
[im 100/286  soft-tissue]
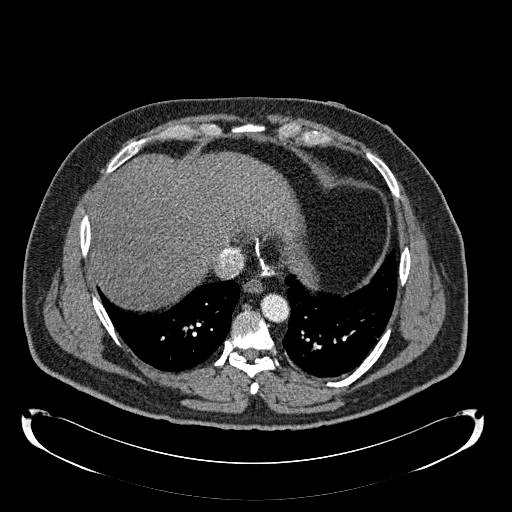
[im 112/286  lung]
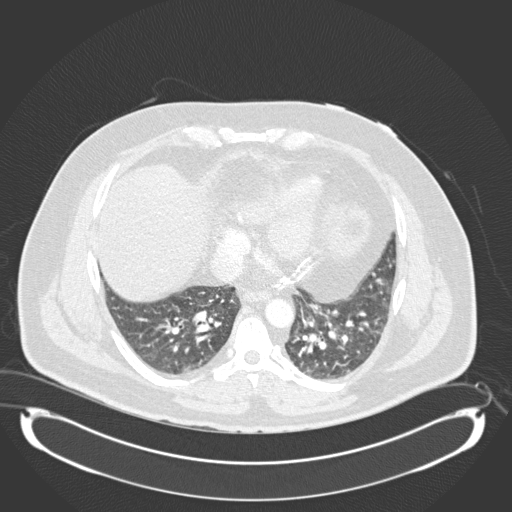
[im 137/286  soft-tissue]
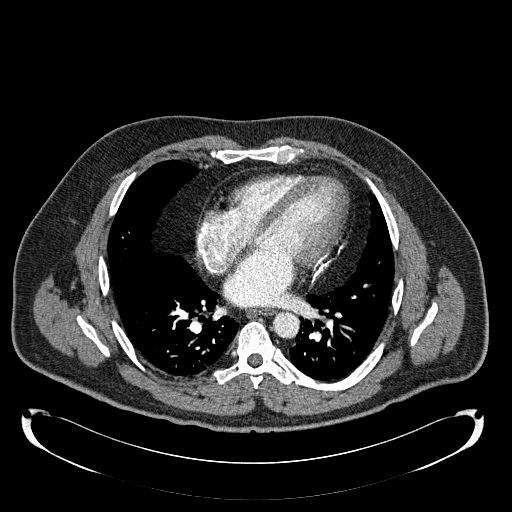
[im 149/286  lung]
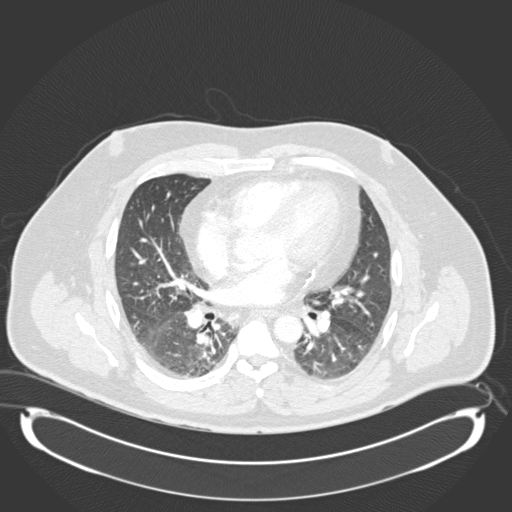
[im 174/286  soft-tissue]
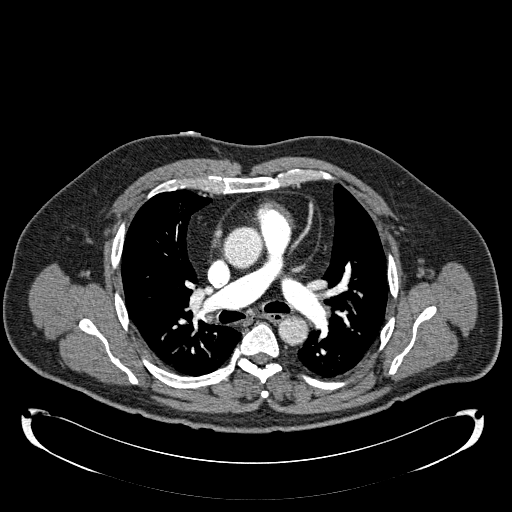
[im 186/286  lung]
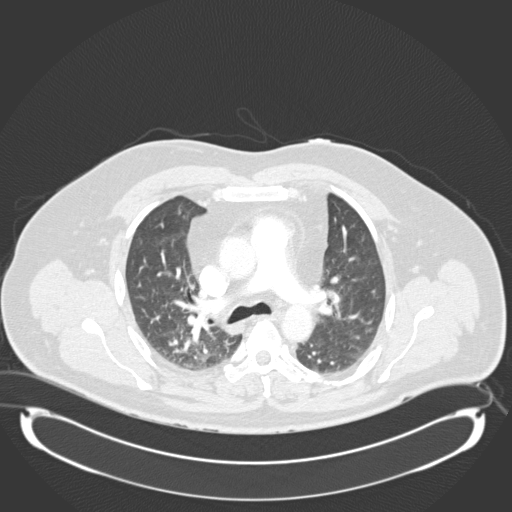
[im 199/286  soft-tissue]
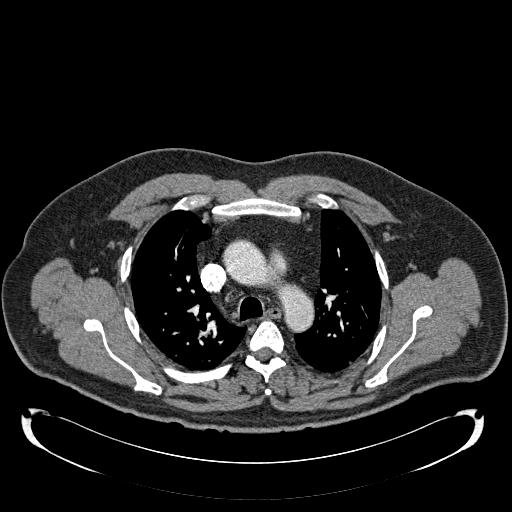
[im 224/286  lung]
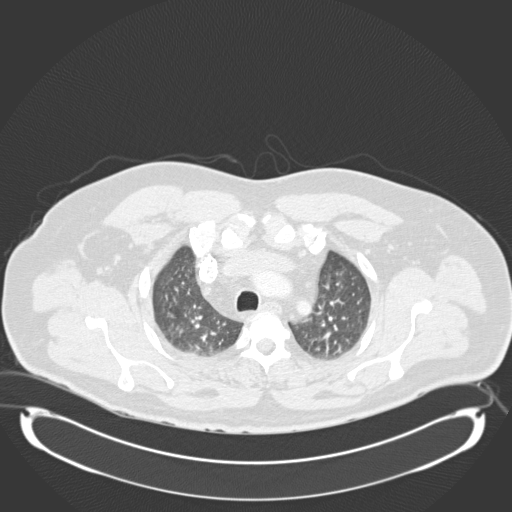
[im 236/286  soft-tissue]
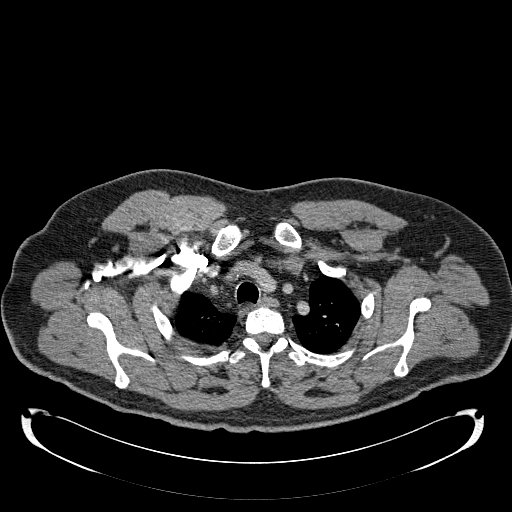
[im 248/286  lung]
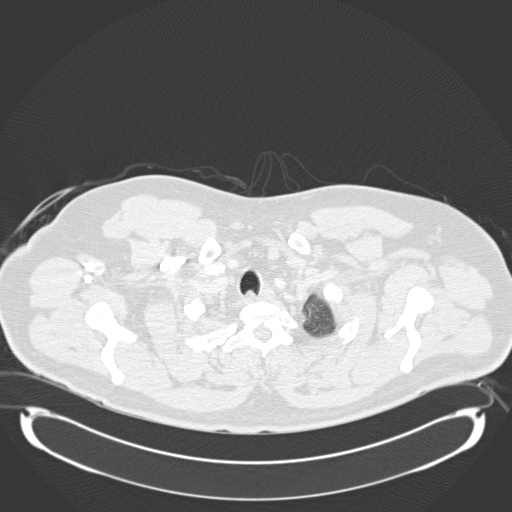
[im 273/286  soft-tissue]
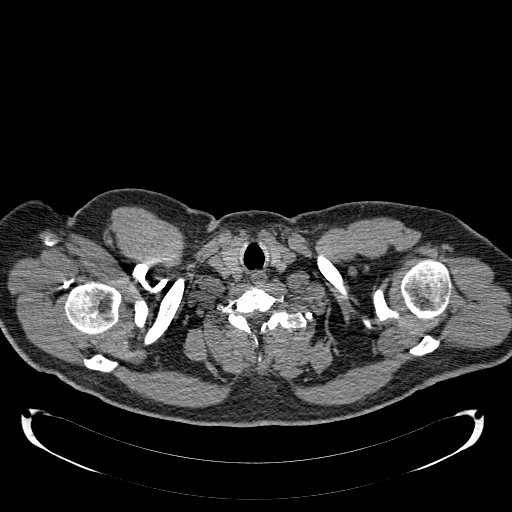

[Series 604: coronal · coronal · 0.86mm/px · 3 of 89 slices shown]
[im 23/89  soft-tissue]
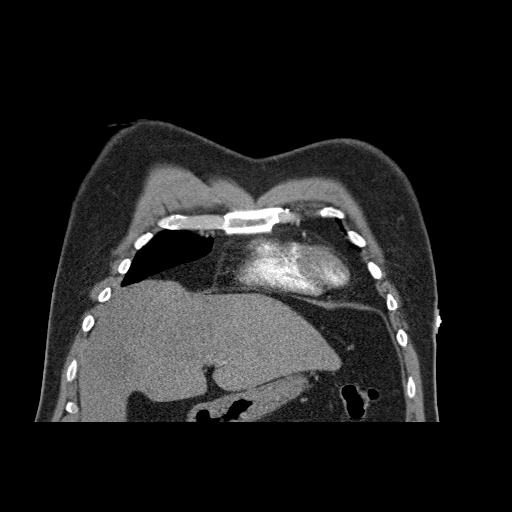
[im 45/89  soft-tissue]
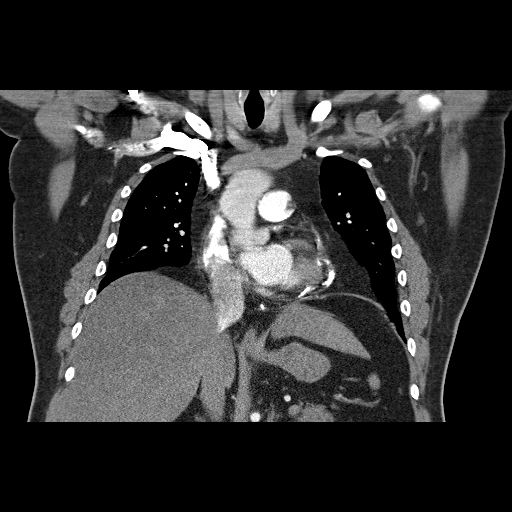
[im 67/89  soft-tissue]
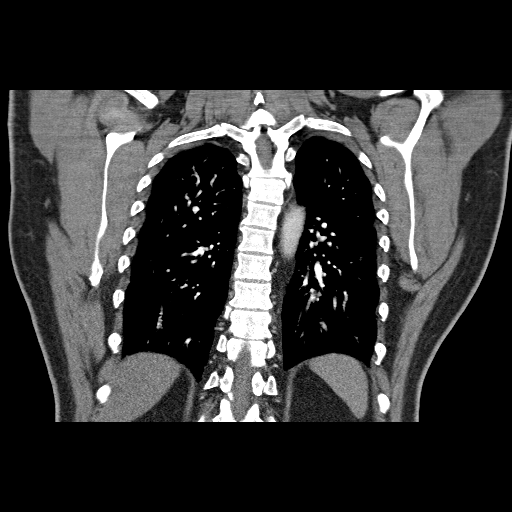

[19 of 46 positions shown; findings below may reference images not displayed]

FINDINGS: Adequate contrast bolus timing in the pulmonary arterial
tree.  Mild respiratory motion artifact intermittently noted.  No
convincing pulmonary artery filling defect.

Pericardial calcifications are noted along the left heart border
and at the base of the heart (series 6 image 53).  No pericardial
effusion.  No pleural effusion.  No mediastinal lymphadenopathy.
There is a degree of mediastinal lipomatosis.  Negative visualized
thoracic inlet.  Some coronary artery calcified atherosclerosis is
suspected.

Major airways are patent with mild atelectatic changes.  Dependent
bilateral pulmonary atelectasis and crowding of lung markings.  No
consolidation.

No acute osseous abnormality identified.

In the visualized upper abdomen there is a geographic area of
decreased density in the liver (series 6 image 77).  This is stable
compared to the previous abdominal studies.  The gallbladder
surgically absent.  The liver contour is within normal limits.
Other visualized upper abdominal viscera are within normal limits.
IMPRESSION: 1. No evidence of acute pulmonary embolus.
2.  Chronic calcific pericarditis.  No pericardial or pleural
effusion.
3.  Pulmonary atelectasis.
4. Chronic geographic hepatic steatosis.

## 2013-05-10 NOTE — ED Provider Notes (Signed)
Medical screening examination/treatment/procedure(s) were performed by non-physician practitioner and as supervising physician I was immediately available for consultation/collaboration.   Gwyneth Sprout, MD 05/10/13 [phone]

## 2013-07-05 ENCOUNTER — Emergency Department (HOSPITAL_COMMUNITY): Payer: Medicare PPO

## 2013-07-05 ENCOUNTER — Encounter (HOSPITAL_COMMUNITY): Payer: Self-pay | Admitting: Emergency Medicine

## 2013-07-05 ENCOUNTER — Emergency Department (HOSPITAL_COMMUNITY)
Admission: EM | Admit: 2013-07-05 | Discharge: 2013-07-05 | Disposition: A | Discharge status: Home / Self Care | Payer: Medicare PPO | Attending: Emergency Medicine | Admitting: Emergency Medicine

## 2013-07-05 DIAGNOSIS — E162 Hypoglycemia, unspecified: Secondary | ICD-10-CM

## 2013-07-05 DIAGNOSIS — J45909 Unspecified asthma, uncomplicated: Secondary | ICD-10-CM | POA: Insufficient documentation

## 2013-07-05 DIAGNOSIS — Z79899 Other long term (current) drug therapy: Secondary | ICD-10-CM | POA: Insufficient documentation

## 2013-07-05 DIAGNOSIS — E785 Hyperlipidemia, unspecified: Secondary | ICD-10-CM | POA: Insufficient documentation

## 2013-07-05 DIAGNOSIS — H53149 Visual discomfort, unspecified: Secondary | ICD-10-CM | POA: Insufficient documentation

## 2013-07-05 DIAGNOSIS — H5000 Unspecified esotropia: Secondary | ICD-10-CM | POA: Insufficient documentation

## 2013-07-05 DIAGNOSIS — Z794 Long term (current) use of insulin: Secondary | ICD-10-CM | POA: Insufficient documentation

## 2013-07-05 DIAGNOSIS — Z87891 Personal history of nicotine dependence: Secondary | ICD-10-CM | POA: Insufficient documentation

## 2013-07-05 DIAGNOSIS — R42 Dizziness and giddiness: Secondary | ICD-10-CM | POA: Insufficient documentation

## 2013-07-05 DIAGNOSIS — R51 Headache: Secondary | ICD-10-CM | POA: Insufficient documentation

## 2013-07-05 DIAGNOSIS — Z7982 Long term (current) use of aspirin: Secondary | ICD-10-CM | POA: Insufficient documentation

## 2013-07-05 DIAGNOSIS — R519 Headache, unspecified: Secondary | ICD-10-CM

## 2013-07-05 DIAGNOSIS — I10 Essential (primary) hypertension: Secondary | ICD-10-CM | POA: Insufficient documentation

## 2013-07-05 DIAGNOSIS — F329 Major depressive disorder, single episode, unspecified: Secondary | ICD-10-CM | POA: Insufficient documentation

## 2013-07-05 DIAGNOSIS — F3289 Other specified depressive episodes: Secondary | ICD-10-CM | POA: Insufficient documentation

## 2013-07-05 DIAGNOSIS — E119 Type 2 diabetes mellitus without complications: Secondary | ICD-10-CM | POA: Insufficient documentation

## 2013-07-05 LAB — CBC WITH DIFFERENTIAL/PLATELET
Basophils Relative: 0 % (ref 0–1)
Eosinophils Absolute: 0.1 10*3/uL (ref 0.0–0.7)
Hemoglobin: 15.6 g/dL (ref 13.0–17.0)
Lymphocytes Relative: 11 % — ABNORMAL LOW (ref 12–46)
MCH: 30.8 pg (ref 26.0–34.0)
MCHC: 34.7 g/dL (ref 30.0–36.0)
Monocytes Relative: 5 % (ref 3–12)
Neutro Abs: 20.8 10*3/uL — ABNORMAL HIGH (ref 1.7–7.7)
Neutrophils Relative %: 83 % — ABNORMAL HIGH (ref 43–77)
Platelets: 374 10*3/uL (ref 150–400)
RBC: 5.06 MIL/uL (ref 4.22–5.81)

## 2013-07-05 LAB — GLUCOSE, CAPILLARY
Glucose-Capillary: 44 mg/dL — CL (ref 70–99)
Glucose-Capillary: 77 mg/dL (ref 70–99)
Glucose-Capillary: 114 mg/dL — ABNORMAL HIGH (ref 70–99)

## 2013-07-05 LAB — BASIC METABOLIC PANEL
BUN: 21 mg/dL (ref 6–23)
CO2: 23 mEq/L (ref 19–32)
Chloride: 103 mEq/L (ref 96–112)
GFR calc Af Amer: >90 mL/min (ref >90)
GFR calc non Af Amer: >90 mL/min (ref >90)
Potassium: 2.9 mEq/L — ABNORMAL LOW (ref 3.5–5.1)
Sodium: 139 mEq/L (ref 135–145)

## 2013-07-05 MED ORDER — HYDROCODONE-ACETAMINOPHEN 5-325 MG PO TABS: 2.0000 | ORAL_TABLET | ORAL | Status: DC | PRN

## 2013-07-05 MED ORDER — DEXTROSE 50 % IV SOLN
INTRAVENOUS | Status: DC
Start: 2013-07-05 — End: 2013-07-05
  Filled 2013-07-05: qty 50

## 2013-07-05 MED ORDER — MORPHINE SULFATE 4 MG/ML IJ SOLN: 4.0000 mg | INTRAMUSCULAR | Status: DC | PRN

## 2013-07-05 MED ORDER — DEXTROSE 50 % IV SOLN
12.5000 g | Freq: Once | INTRAVENOUS | Status: AC
  Administered 2013-07-05: 12.5 g via INTRAVENOUS
  Filled 2013-07-05: qty 50

## 2013-07-05 MED ORDER — ONDANSETRON 4 MG PO TBDP: 4.0000 mg | ORAL_TABLET | Freq: Three times a day (TID) | ORAL | Status: DC | PRN

## 2013-07-05 MED ORDER — DEXTROSE 50 % IV SOLN
25.0000 mL | Freq: Once | INTRAVENOUS | Status: AC
  Administered 2013-07-05: 25 mL via INTRAVENOUS

## 2013-07-05 MED ORDER — METOCLOPRAMIDE HCL 5 MG/ML IJ SOLN
10.0000 mg | Freq: Once | INTRAMUSCULAR | Status: AC
  Administered 2013-07-05: 10 mg via INTRAVENOUS
  Filled 2013-07-05: qty 2

## 2013-07-05 MED ORDER — SODIUM CHLORIDE 0.9 % IV BOLUS (SEPSIS)
1000.0000 mL | Freq: Once | INTRAVENOUS | Status: AC
  Administered 2013-07-05: 1000 mL via INTRAVENOUS

## 2013-07-05 MED ORDER — DEXTROSE 50 % IV SOLN: 12.5000 g | Freq: Once | INTRAVENOUS | Status: DC

## 2013-07-05 NOTE — ED Provider Notes (Signed)
CSN: [id number]     Arrival date & time 07/05/13  1342 History   First MD Initiated Contact with Patient 07/05/13 1642     Chief Complaint  Patient presents with  . Headache    HPI  Patient presents with a chief complaint of a headache. Today is Monday his headache started on Friday. It is about 6:00 in evening. He was getting her to go to his daughter's basketball game. He states it was slow in onset. Nothing at the gym he states the lights in the usual and it is "pounding". He is uncomfortable with a headache constantly all weekend and presents today. He had some recent headaches and underwent a sleep study and had sleep apnea. He wears CPAP at night and this morning headaches are resolved. This is an unusual headache for him. Not thunderclap in onset. He has not had fever. He has not had neck stiffness rash or other concerning symptoms.  Today is Monday as a busy day in pressure. He had a fairly long wait in the waiting room. He started feeling shaky and sweaty. His blood sugar was 50. This is at the time his blood was drawn. He ate and drank him checked his blood sugar again and it was 112.  Past Medical History  Diagnosis Date  . Diabetes mellitus   . Hyperlipidemia   . Hypertension   . GERD (gastroesophageal reflux disease)   . History of kidney stones   . Depression   . Asthma   . Kidney disease     stones  . Noncompliance with diabetes treatment    Past Surgical History  Procedure Laterality Date  . Appendectomy  2004  . Cholecystectomy  2004  . Leg surgery    . Strabismus surgery    . Eye surgery  1970  . Knee cartilage surgery    . Skin graft  1974  . Colonoscopy N/A 09/08/2012    Procedure: COLONOSCOPY;  Surgeon: Romie Levee, MD;  Location: [facility];  Service: Endoscopy;  Laterality: N/A;  . Cardiac catheterization      Dr. Daleen Squibb ([facility])   Family History  Problem Relation Age of Onset  . Diabetes Mother   . Heart attack Mother   . Stroke Mother   .  Diabetes Father   . Heart disease Other     Parent  . Heart disease Other     Grandparents   History  Substance Use Topics  . Smoking status: Former Smoker -- 4 years    Types: Cigarettes  . Smokeless tobacco: Current User    Types: Snuff  . Alcohol Use: No    Review of Systems  Constitutional: Negative for fever, chills, diaphoresis, appetite change and fatigue.  HENT: Negative for mouth sores, sore throat and trouble swallowing.   Eyes: Negative for visual disturbance.  Respiratory: Negative for cough, chest tightness, shortness of breath and wheezing.   Cardiovascular: Negative for chest pain.  Gastrointestinal: Negative for nausea, vomiting, abdominal pain, diarrhea and abdominal distention.  Endocrine: Negative for polydipsia, polyphagia and polyuria.  Genitourinary: Negative for dysuria, frequency and hematuria.  Musculoskeletal: Negative for gait problem.  Skin: Negative for color change, pallor and rash.  Neurological: Positive for dizziness and headaches. Negative for syncope and light-headedness.       Light sensitive. No vision changes.  Hematological: Does not bruise/bleed easily.  Psychiatric/Behavioral: Negative for behavioral problems and confusion.    Allergies  Byetta 10 mcg pen; Codeine; and Victoza  Home Medications  Current Outpatient Rx  Name  Route  Sig  Dispense  Refill  . albuterol (PROVENTIL HFA;VENTOLIN HFA) 108 (90 BASE) MCG/ACT inhaler   Inhalation   Inhale 2 puffs into the lungs every 6 (six) hours as needed for wheezing or shortness of breath.         Marland Kitchen aspirin EC 81 MG tablet   Oral   Take 1 tablet (81 mg total) by mouth daily.   90 tablet   3   . atorvastatin (LIPITOR) 20 MG tablet   Oral   Take 20 mg by mouth daily.         . Canagliflozin (INVOKANA) 300 MG TABS   Oral   Take 300 mg by mouth daily.         Marland Kitchen ibuprofen (ADVIL,MOTRIN) 200 MG tablet   Oral   Take 800 mg by mouth every 6 (six) hours as needed for pain.          Marland Kitchen insulin glargine (LANTUS) 100 UNIT/ML injection   Subcutaneous   Inject 60 Units into the skin 2 (two) times daily.          Marland Kitchen lisinopril-hydrochlorothiazide (PRINZIDE,ZESTORETIC) 20-12.5 MG per tablet   Oral   Take 1 tablet by mouth daily.   30 tablet   11   . HYDROcodone-acetaminophen (NORCO/VICODIN) 5-325 MG per tablet   Oral   Take 2 tablets by mouth every 4 (four) hours as needed.   10 tablet   0   . ondansetron (ZOFRAN ODT) 4 MG disintegrating tablet   Oral   Take 1 tablet (4 mg total) by mouth every 8 (eight) hours as needed for nausea.   5 tablet   0    BP 107/62  Pulse 95  Temp(Src) 98 F (36.7 C) (Oral)  Resp 20  Wt 192 lb 12.8 oz (87.454 kg)  SpO2 98% Physical Exam  Constitutional: He is oriented to person, place, and time. He appears well-developed and well-nourished. No distress.  HENT:  Head: Normocephalic.  Eyes: Conjunctivae are normal. Pupils are equal, round, and reactive to light. No scleral icterus.  Neck: Normal range of motion. Neck supple. No thyromegaly present.  Cardiovascular: Normal rate and regular rhythm.  Exam reveals no gallop and no friction rub.   No murmur heard. Pulmonary/Chest: Effort normal and breath sounds normal. No respiratory distress. He has no wheezes. He has no rales.  Abdominal: Soft. Bowel sounds are normal. He exhibits no distension. There is no tenderness. There is no rebound.  Musculoskeletal: Normal range of motion.  Neurological: He is alert and oriented to person, place, and time.  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 He states this has been this way most of his life t. Attempt at surgical repair as a child. He does not have diplopia chronically or acutely.  Skin: Skin is warm and dry. No rash noted.  Psychiatric: He has a normal mood and affect. His behavior is normal.    ED Course  Procedures (including critical care time) Labs Review Labs Reviewed  CBC WITH DIFFERENTIAL - Abnormal; Notable for the  following:    WBC 25.0 (*)    Neutrophils Relative % 83 (*)    Neutro Abs 20.8 (*)    Lymphocytes Relative 11 (*)    Monocytes Absolute 1.3 (*)    All other components within normal limits  BASIC METABOLIC PANEL - Abnormal; Notable for the following:    Potassium 2.9 (*)    Glucose, Bld 52 (*)  All other components within normal limits  GLUCOSE, CAPILLARY - Abnormal; Notable for the following:    Glucose-Capillary 44 (*)    All other components within normal limits  GLUCOSE, CAPILLARY   Imaging Review Ct Head Wo Contrast  07/05/2013   CLINICAL DATA:  Headache and vertigo  EXAM: CT HEAD WITHOUT CONTRAST  TECHNIQUE: Contiguous axial images were obtained from the base of the skull through the vertex without intravenous contrast. Study was obtained within 24 hr of patient's arrival at the emergency department.  COMPARISON:  March 06, 2006  FINDINGS: Ventricles are normal in size and configuration. There is no mass, hemorrhage, extra-axial fluid collection, or midline shift. Gray-white compartments are normal. No acute infarct demonstrable.  Bony calvarium appears intact.  The mastoid air cells are clear.  There is opacity in the external auditory canal on the left. Question cerumen impaction.  IMPRESSION: Opacity in left external auditory canal. Question cerumen impaction. Study otherwise unremarkable. No demonstrable intracranial mass, hemorrhage, or acute infarct.   Electronically Signed   By: Bretta Bang M.D.   On: 07/05/2013 19:52    EKG Interpretation   None       MDM   1. Headache   2. Hypoglycemia      Patient has a 3 day headache that is unusual for him. No fevers or infectious symptoms. No neck pain or stiffness. Throbbing in and light sensitivity. Sounds like a migraine or vascular-type headache. Platelets is 25,000. This is obtained when he was feeling shaky and jittery and his sugar was 50. He ate and drank and his blood sugar is improved. I do that the  leukocytosis is likely stress reaction to his hypoglycemia and not a sign of current infection.  20:10:  The patient had great relief of his headache. CT scan shows cerumen impaction other acute abnormalities. He's eaten a meal here. His blood sugars remained adequate. It is appropriate for outpatient discharge with a limited number of Vicodin /Zofran.    Roney Marion, MD 07/05/13 [phone]

## 2013-07-05 NOTE — ED Notes (Signed)
Pt with headache x 3 days.  Headache is "all over head".  Denies blurred vision but c/o nausea and photophobia. Neuro intact.

## 2013-07-26 ENCOUNTER — Telehealth: Payer: Self-pay | Admitting: Cardiovascular Disease

## 2013-07-26 NOTE — Telephone Encounter (Signed)
Returned a call to [employer] and she was calling to inform me that the patient's request for CPAP from [facility] was denied due to the patient not submiting a compliance download. According to Jacqulyn Liner has requested the patient come in so that they can get download X 3 without any response. This is the reason the claim has been  Denied.

## 2013-07-26 NOTE — Telephone Encounter (Signed)
Need authorization for a C-Pap machine.

## 2013-07-26 NOTE — Telephone Encounter (Signed)
Message forwarded to Wanda, CMA.  

## 2013-07-28 ENCOUNTER — Ambulatory Visit: Payer: Medicare PPO | Admitting: Cardiovascular Disease

## 2013-12-27 ENCOUNTER — Encounter (HOSPITAL_COMMUNITY): Payer: Self-pay | Admitting: Emergency Medicine

## 2013-12-27 ENCOUNTER — Emergency Department (HOSPITAL_BASED_OUTPATIENT_CLINIC_OR_DEPARTMENT_OTHER): Payer: Medicare PPO

## 2013-12-27 ENCOUNTER — Emergency Department (HOSPITAL_BASED_OUTPATIENT_CLINIC_OR_DEPARTMENT_OTHER)
Admission: EM | Admit: 2013-12-27 | Discharge: 2013-12-28 | Disposition: A | Discharge status: Home / Self Care | Payer: Medicare PPO | Attending: Emergency Medicine | Admitting: Emergency Medicine

## 2013-12-27 ENCOUNTER — Encounter (HOSPITAL_BASED_OUTPATIENT_CLINIC_OR_DEPARTMENT_OTHER): Payer: Self-pay | Admitting: Emergency Medicine

## 2013-12-27 ENCOUNTER — Emergency Department (HOSPITAL_COMMUNITY)
Admission: EM | Admit: 2013-12-27 | Discharge: 2013-12-27 | Discharge status: Against Medical Advice | Payer: Medicare PPO | Source: Home / Self Care

## 2013-12-27 ENCOUNTER — Emergency Department (HOSPITAL_COMMUNITY): Payer: Medicare PPO

## 2013-12-27 DIAGNOSIS — R110 Nausea: Secondary | ICD-10-CM

## 2013-12-27 DIAGNOSIS — R51 Headache: Secondary | ICD-10-CM | POA: Insufficient documentation

## 2013-12-27 DIAGNOSIS — R519 Headache, unspecified: Secondary | ICD-10-CM

## 2013-12-27 DIAGNOSIS — Z87891 Personal history of nicotine dependence: Secondary | ICD-10-CM | POA: Insufficient documentation

## 2013-12-27 DIAGNOSIS — M79609 Pain in unspecified limb: Secondary | ICD-10-CM

## 2013-12-27 DIAGNOSIS — J3489 Other specified disorders of nose and nasal sinuses: Secondary | ICD-10-CM | POA: Insufficient documentation

## 2013-12-27 DIAGNOSIS — R05 Cough: Secondary | ICD-10-CM

## 2013-12-27 DIAGNOSIS — R109 Unspecified abdominal pain: Secondary | ICD-10-CM | POA: Insufficient documentation

## 2013-12-27 DIAGNOSIS — I10 Essential (primary) hypertension: Secondary | ICD-10-CM

## 2013-12-27 DIAGNOSIS — Z7982 Long term (current) use of aspirin: Secondary | ICD-10-CM | POA: Insufficient documentation

## 2013-12-27 DIAGNOSIS — E785 Hyperlipidemia, unspecified: Secondary | ICD-10-CM | POA: Insufficient documentation

## 2013-12-27 DIAGNOSIS — J45909 Unspecified asthma, uncomplicated: Secondary | ICD-10-CM | POA: Insufficient documentation

## 2013-12-27 DIAGNOSIS — Z79899 Other long term (current) drug therapy: Secondary | ICD-10-CM | POA: Insufficient documentation

## 2013-12-27 DIAGNOSIS — E119 Type 2 diabetes mellitus without complications: Secondary | ICD-10-CM

## 2013-12-27 DIAGNOSIS — M542 Cervicalgia: Secondary | ICD-10-CM | POA: Insufficient documentation

## 2013-12-27 DIAGNOSIS — H9209 Otalgia, unspecified ear: Secondary | ICD-10-CM | POA: Insufficient documentation

## 2013-12-27 DIAGNOSIS — Z794 Long term (current) use of insulin: Secondary | ICD-10-CM | POA: Insufficient documentation

## 2013-12-27 DIAGNOSIS — Z87442 Personal history of urinary calculi: Secondary | ICD-10-CM | POA: Insufficient documentation

## 2013-12-27 DIAGNOSIS — Z8719 Personal history of other diseases of the digestive system: Secondary | ICD-10-CM | POA: Insufficient documentation

## 2013-12-27 DIAGNOSIS — Z8659 Personal history of other mental and behavioral disorders: Secondary | ICD-10-CM | POA: Insufficient documentation

## 2013-12-27 DIAGNOSIS — R509 Fever, unspecified: Secondary | ICD-10-CM

## 2013-12-27 DIAGNOSIS — S1096XA Insect bite of unspecified part of neck, initial encounter: Secondary | ICD-10-CM | POA: Insufficient documentation

## 2013-12-27 DIAGNOSIS — R350 Frequency of micturition: Secondary | ICD-10-CM | POA: Insufficient documentation

## 2013-12-27 DIAGNOSIS — W57XXXA Bitten or stung by nonvenomous insect and other nonvenomous arthropods, initial encounter: Secondary | ICD-10-CM | POA: Insufficient documentation

## 2013-12-27 DIAGNOSIS — Y929 Unspecified place or not applicable: Secondary | ICD-10-CM | POA: Insufficient documentation

## 2013-12-27 DIAGNOSIS — R000 Tachycardia, unspecified: Secondary | ICD-10-CM | POA: Insufficient documentation

## 2013-12-27 DIAGNOSIS — Y939 Activity, unspecified: Secondary | ICD-10-CM | POA: Insufficient documentation

## 2013-12-27 DIAGNOSIS — R059 Cough, unspecified: Secondary | ICD-10-CM | POA: Insufficient documentation

## 2013-12-27 DIAGNOSIS — M549 Dorsalgia, unspecified: Secondary | ICD-10-CM | POA: Insufficient documentation

## 2013-12-27 DIAGNOSIS — S30860A Insect bite (nonvenomous) of lower back and pelvis, initial encounter: Secondary | ICD-10-CM | POA: Insufficient documentation

## 2013-12-27 LAB — COMPREHENSIVE METABOLIC PANEL
ALK PHOS: 108 U/L (ref 39–117)
ALK PHOS: 103 U/L (ref 39–117)
ALT: 47 U/L (ref 0–53)
ALT: 44 U/L (ref 0–53)
AST: 31 U/L (ref 0–37)
AST: 29 U/L (ref 0–37)
Albumin: 3.4 g/dL — ABNORMAL LOW (ref 3.5–5.2)
Albumin: 3.6 g/dL (ref 3.5–5.2)
BILIRUBIN TOTAL: 0.4 mg/dL (ref 0.3–1.2)
BILIRUBIN TOTAL: 0.4 mg/dL (ref 0.3–1.2)
BUN: 11 mg/dL (ref 6–23)
BUN: 11 mg/dL (ref 6–23)
CHLORIDE: 101 meq/L (ref 96–112)
CO2: 24 mEq/L (ref 19–32)
CO2: 23 mEq/L (ref 19–32)
CREATININE: 0.8 mg/dL (ref 0.50–1.35)
Calcium: 9.5 mg/dL (ref 8.4–10.5)
Calcium: 9.5 mg/dL (ref 8.4–10.5)
Chloride: 100 mEq/L (ref 96–112)
Creatinine, Ser: 0.89 mg/dL (ref 0.50–1.35)
GFR calc Af Amer: >90 mL/min (ref >90)
GLUCOSE: 269 mg/dL — AB (ref 70–99)
Glucose, Bld: 248 mg/dL — ABNORMAL HIGH (ref 70–99)
POTASSIUM: 3.8 meq/L (ref 3.7–5.3)
POTASSIUM: 3.7 meq/L (ref 3.7–5.3)
Sodium: 138 mEq/L (ref 137–147)
Sodium: 137 mEq/L (ref 137–147)
Total Protein: 7.2 g/dL (ref 6.0–8.3)
Total Protein: 7 g/dL (ref 6.0–8.3)

## 2013-12-27 LAB — CBC WITH DIFFERENTIAL/PLATELET
BASOS ABS: 0.1 10*3/uL (ref 0.0–0.1)
BASOS ABS: 0 10*3/uL (ref 0.0–0.1)
Basophils Relative: 2 % — ABNORMAL HIGH (ref 0–1)
Basophils Relative: 0 % (ref 0–1)
EOS PCT: 0 % (ref 0–5)
Eosinophils Absolute: 0 10*3/uL (ref 0.0–0.7)
Eosinophils Absolute: 0 10*3/uL (ref 0.0–0.7)
Eosinophils Relative: 0 % (ref 0–5)
HCT: 45.9 % (ref 39.0–52.0)
HCT: 44.4 % (ref 39.0–52.0)
HEMOGLOBIN: 15.8 g/dL (ref 13.0–17.0)
Hemoglobin: 16 g/dL (ref 13.0–17.0)
LYMPHS PCT: 30 % (ref 12–46)
LYMPHS PCT: 30 % (ref 12–46)
Lymphs Abs: 1.1 10*3/uL (ref 0.7–4.0)
Lymphs Abs: 1.3 10*3/uL (ref 0.7–4.0)
MCH: 30 pg (ref 26.0–34.0)
MCH: 30.6 pg (ref 26.0–34.0)
MCHC: 34.9 g/dL (ref 30.0–36.0)
MCHC: 35.6 g/dL (ref 30.0–36.0)
MCV: 86.1 fL (ref 78.0–100.0)
MCV: 85.9 fL (ref 78.0–100.0)
MONOS PCT: 16 % — AB (ref 3–12)
Monocytes Absolute: 0.3 10*3/uL (ref 0.1–1.0)
Monocytes Absolute: 0.7 10*3/uL (ref 0.1–1.0)
Monocytes Relative: 9 % (ref 3–12)
NEUTROS ABS: 2.1 10*3/uL (ref 1.7–7.7)
NEUTROS ABS: 2.2 10*3/uL (ref 1.7–7.7)
Neutrophils Relative %: 59 % (ref 43–77)
Neutrophils Relative %: 54 % (ref 43–77)
PLATELETS: 172 10*3/uL (ref 150–400)
Platelets: 177 10*3/uL (ref 150–400)
RBC: 5.33 MIL/uL (ref 4.22–5.81)
RBC: 5.17 MIL/uL (ref 4.22–5.81)
RDW: 13.3 % (ref 11.5–15.5)
RDW: 13.4 % (ref 11.5–15.5)
WBC: 3.6 10*3/uL — ABNORMAL LOW (ref 4.0–10.5)
WBC: 4.2 10*3/uL (ref 4.0–10.5)

## 2013-12-27 LAB — URINALYSIS, ROUTINE W REFLEX MICROSCOPIC
Bilirubin Urine: NEGATIVE
Hgb urine dipstick: NEGATIVE
Ketones, ur: 15 mg/dL — AB
LEUKOCYTES UA: NEGATIVE
Nitrite: NEGATIVE
PH: 6 (ref 5.0–8.0)
Protein, ur: NEGATIVE mg/dL
Specific Gravity, Urine: >1.046 — ABNORMAL HIGH (ref 1.005–1.030)
Urobilinogen, UA: 1 mg/dL (ref 0.0–1.0)

## 2013-12-27 LAB — CBG MONITORING, ED: Glucose-Capillary: 206 mg/dL — ABNORMAL HIGH (ref 70–99)

## 2013-12-27 LAB — URINE MICROSCOPIC-ADD ON

## 2013-12-27 MED ORDER — KETOROLAC TROMETHAMINE 30 MG/ML IJ SOLN
30.0000 mg | Freq: Once | INTRAMUSCULAR | Status: AC
  Administered 2013-12-27: 30 mg via INTRAVENOUS
  Filled 2013-12-27: qty 1

## 2013-12-27 MED ORDER — METOCLOPRAMIDE HCL 5 MG/ML IJ SOLN
10.0000 mg | Freq: Once | INTRAMUSCULAR | Status: AC
  Administered 2013-12-27: 10 mg via INTRAVENOUS
  Filled 2013-12-27: qty 2

## 2013-12-27 MED ORDER — SODIUM CHLORIDE 0.9 % IV SOLN
Freq: Once | INTRAVENOUS | Status: DC
Start: 2013-12-27 — End: 2013-12-28

## 2013-12-27 MED ORDER — ONDANSETRON HCL 4 MG/2ML IJ SOLN
4.0000 mg | Freq: Once | INTRAMUSCULAR | Status: AC
  Administered 2013-12-27: 4 mg via INTRAVENOUS
  Filled 2013-12-27: qty 2

## 2013-12-27 MED ORDER — DIPHENHYDRAMINE HCL 50 MG/ML IJ SOLN
25.0000 mg | Freq: Once | INTRAMUSCULAR | Status: AC
  Administered 2013-12-27: 25 mg via INTRAVENOUS
  Filled 2013-12-27: qty 1

## 2013-12-27 MED ORDER — DOXYCYCLINE HYCLATE 100 MG PO CAPS: 100.0000 mg | ORAL_CAPSULE | Freq: Two times a day (BID) | ORAL | Status: DC

## 2013-12-27 MED ORDER — DEXAMETHASONE SODIUM PHOSPHATE 10 MG/ML IJ SOLN
10.0000 mg | Freq: Once | INTRAMUSCULAR | Status: AC
  Administered 2013-12-27: 10 mg via INTRAVENOUS
  Filled 2013-12-27: qty 1

## 2013-12-27 MED ORDER — SODIUM CHLORIDE 0.9 % IV SOLN
INTRAVENOUS | Status: DC
  Administered 2013-12-27: 20:00:00 via INTRAVENOUS
  Administered 2013-12-27: 1000 mL via INTRAVENOUS

## 2013-12-27 NOTE — ED Notes (Addendum)
Pt reports body aches, fevers, and chills for 2 days. Coughing. Generalized body aches especially in legs to back. Reports nausea but does not want meds since states it puts him to sleep. Denies chest pain or shortness or breath just coughing.

## 2013-12-27 NOTE — ED Provider Notes (Signed)
CSN: [id number]     Arrival date & time 12/27/13  1715 History   First MD Initiated Contact with Patient 12/27/13 1841     Chief Complaint  Patient presents with  . Fever     (Consider location/radiation/quality/duration/timing/severity/associated sxs/prior Treatment) Patient is a 48 y.o. male presenting with URI. The history is provided by the patient.  URI Presenting symptoms: congestion, cough, ear pain and fever   Presenting symptoms: no sore throat   Severity:  Moderate Onset quality:  Gradual Duration:  2 days Timing:  Constant Progression:  Worsening Chronicity:  New Relieved by:  Nothing Worsened by:  Nothing tried Ineffective treatments: ibuprofen. Associated symptoms: headaches, myalgias and neck pain    Levi Meyers is a 48 y.o. male who presents to the ED with cough, fever, chills and generalized body aches for the past 2 days. He has had nausea but no vomiting. The patient complains of a really bad headache that is similar to headaches that he has been her for in the past but only worse. He complains of his neck pain. He has pulled off 3 ticks from his back and head 4 days ago. He is concerned that his symptoms may be due to RMSF.   Past Medical History  Diagnosis Date  . Diabetes mellitus   . Hyperlipidemia   . Hypertension   . GERD (gastroesophageal reflux disease)   . History of kidney stones   . Depression   . Asthma   . Kidney disease     stones  . Noncompliance with diabetes treatment    Past Surgical History  Procedure Laterality Date  . Appendectomy  2004  . Cholecystectomy  2004  . Leg surgery    . Strabismus surgery    . Eye surgery  1970  . Knee cartilage surgery    . Skin graft  1974  . Colonoscopy N/A 09/08/2012    Procedure: COLONOSCOPY;  Surgeon: Leighton Ruff, MD;  Location: [facility];  Service: Endoscopy;  Laterality: N/A;  . Cardiac catheterization      Dr. Verl Blalock ([facility])   Family History  Problem Relation Age of Onset  .  Diabetes Mother   . Heart attack Mother   . Stroke Mother   . Diabetes Father   . Heart disease Other     Parent  . Heart disease Other     Grandparents   History  Substance Use Topics  . Smoking status: Former Smoker -- 4 years    Types: Cigarettes  . Smokeless tobacco: Current User    Types: Snuff  . Alcohol Use: No    Review of Systems  Constitutional: Positive for fever and chills.  HENT: Positive for congestion and ear pain. Negative for sinus pressure and sore throat.   Eyes: Negative for visual disturbance.  Respiratory: Positive for cough.   Gastrointestinal: Positive for nausea and abdominal pain. Negative for vomiting, diarrhea and constipation.  Genitourinary: Positive for frequency. Negative for dysuria and urgency.  Musculoskeletal: Positive for myalgias and neck pain.  Skin: Negative for rash.  Neurological: Positive for headaches. Negative for seizures and syncope.  Psychiatric/Behavioral: Negative for confusion. The patient is not nervous/anxious.       Allergies  Byetta 10 mcg pen; Codeine; and Victoza  Home Medications   Prior to Admission medications   Medication Sig Start Date End Date Taking? Authorizing Provider  albuterol (PROVENTIL HFA;VENTOLIN HFA) 108 (90 BASE) MCG/ACT inhaler Inhale 2 puffs into the lungs every 6 (six) hours as  needed for wheezing or shortness of breath.    Historical Provider, MD  aspirin EC 81 MG tablet Take 1 tablet (81 mg total) by mouth daily. 01/07/13   Pixie Casino, MD  atorvastatin (LIPITOR) 20 MG tablet Take 20 mg by mouth daily.    Historical Provider, MD  Canagliflozin (INVOKANA) 300 MG TABS Take 300 mg by mouth daily.    Historical Provider, MD  HYDROcodone-acetaminophen (NORCO/VICODIN) 5-325 MG per tablet Take 2 tablets by mouth every 4 (four) hours as needed. 07/05/13   Tanna Furry, MD  ibuprofen (ADVIL,MOTRIN) 200 MG tablet Take 800 mg by mouth every 6 (six) hours as needed for pain.    Historical Provider, MD   insulin glargine (LANTUS) 100 UNIT/ML injection Inject 60 Units into the skin 2 (two) times daily.     Historical Provider, MD  lisinopril-hydrochlorothiazide (PRINZIDE,ZESTORETIC) 20-12.5 MG per tablet Take 1 tablet by mouth daily. 02/18/13   Pixie Casino, MD  ondansetron (ZOFRAN ODT) 4 MG disintegrating tablet Take 1 tablet (4 mg total) by mouth every 8 (eight) hours as needed for nausea. 07/05/13   Tanna Furry, MD   BP 106/77  Pulse 107  Temp(Src) 98.2 F (36.8 C) (Oral)  Resp 20  Ht 5\' 5"  (1.651 m)  Wt 180 lb (81.647 kg)  BMI 29.95 kg/m2  SpO2 98% Physical Exam  Nursing note and vitals reviewed. Constitutional: He is oriented to person, place, and time. He appears well-developed and well-nourished. No distress.  HENT:  Head: Normocephalic and atraumatic.  Right Ear: Tympanic membrane normal.  Left Ear: Tympanic membrane normal.  Nose: Nose normal.  Mouth/Throat: Uvula is midline, oropharynx is clear and moist and mucous membranes are normal.  Eyes: EOM are normal.  Neck: Normal range of motion. Neck supple.  Cardiovascular: Tachycardia present.   Pulmonary/Chest: Effort normal and breath sounds normal.  Abdominal: Soft. Bowel sounds are normal. There is no tenderness.  Musculoskeletal: Normal range of motion.  Lymphadenopathy:    He has no cervical adenopathy.  Neurological: He is alert and oriented to person, place, and time. No cranial nerve deficit.  Skin: Skin is warm and dry.  Psychiatric: He has a normal mood and affect. His behavior is normal.    ED Course  Procedures (including critical care time) Labs Review Results for orders placed during the hospital encounter of 12/27/13 (from the past 24 hour(s))  CBC WITH DIFFERENTIAL     Status: Abnormal   Collection Time    12/27/13  7:20 PM      Result Value Ref Range   WBC 4.2  4.0 - 10.5 K/uL   RBC 5.17  4.22 - 5.81 MIL/uL   Hemoglobin 15.8  13.0 - 17.0 g/dL   HCT 44.4  39.0 - 52.0 %   MCV 85.9  78.0 - 100.0  fL   MCH 30.6  26.0 - 34.0 pg   MCHC 35.6  30.0 - 36.0 g/dL   RDW 13.4  11.5 - 15.5 %   Platelets 177  150 - 400 K/uL   Neutrophils Relative % 54  43 - 77 %   Lymphocytes Relative 30  12 - 46 %   Monocytes Relative 16 (*) 3 - 12 %   Eosinophils Relative 0  0 - 5 %   Basophils Relative 0  0 - 1 %   Neutro Abs 2.2  1.7 - 7.7 K/uL   Lymphs Abs 1.3  0.7 - 4.0 K/uL   Monocytes Absolute 0.7  0.1 -  1.0 K/uL   Eosinophils Absolute 0.0  0.0 - 0.7 K/uL   Basophils Absolute 0.0  0.0 - 0.1 K/uL  COMPREHENSIVE METABOLIC PANEL     Status: Abnormal   Collection Time    12/27/13  7:20 PM      Result Value Ref Range   Sodium 137  137 - 147 mEq/L   Potassium 3.7  3.7 - 5.3 mEq/L   Chloride 101  96 - 112 mEq/L   CO2 23  19 - 32 mEq/L   Glucose, Bld 248 (*) 70 - 99 mg/dL   BUN 11  6 - 23 mg/dL   Creatinine, Ser 0.80  0.50 - 1.35 mg/dL   Calcium 9.5  8.4 - 10.5 mg/dL   Total Protein 7.0  6.0 - 8.3 g/dL   Albumin 3.6  3.5 - 5.2 g/dL   AST 29  0 - 37 U/L   ALT 44  0 - 53 U/L   Alkaline Phosphatase 103  39 - 117 U/L   Total Bilirubin 0.4  0.3 - 1.2 mg/dL   GFR calc non Af Amer >90  >90 mL/min   GFR calc Af Amer >90  >90 mL/min  URINALYSIS, ROUTINE W REFLEX MICROSCOPIC     Status: Abnormal   Collection Time    12/27/13  7:52 PM      Result Value Ref Range   Color, Urine YELLOW  YELLOW   APPearance CLEAR  CLEAR   Specific Gravity, Urine >1.046 (*) 1.005 - 1.030   pH 6.0  5.0 - 8.0   Glucose, UA >1000 (*) NEGATIVE mg/dL   Hgb urine dipstick NEGATIVE  NEGATIVE   Bilirubin Urine NEGATIVE  NEGATIVE   Ketones, ur 15 (*) NEGATIVE mg/dL   Protein, ur NEGATIVE  NEGATIVE mg/dL   Urobilinogen, UA 1.0  0.0 - 1.0 mg/dL   Nitrite NEGATIVE  NEGATIVE   Leukocytes, UA NEGATIVE  NEGATIVE  URINE MICROSCOPIC-ADD ON     Status: None   Collection Time    12/27/13  7:52 PM      Result Value Ref Range   Squamous Epithelial / LPF RARE  RARE   Bacteria, UA RARE  RARE    Imaging Review Dg Chest 2  View  12/27/2013   CLINICAL DATA:  Shortness breath.  Fever.  Malaise.  EXAM: CHEST  2 VIEW  COMPARISON:  05/04/2013  FINDINGS: The heart size and mediastinal contours are within normal limits. Both lungs are clear. The visualized skeletal structures are unremarkable.  IMPRESSION: No active cardiopulmonary disease.   Electronically Signed   By: Earle Gell M.D.   On: 12/27/2013 15:22  @ 2200 in to re evaluate the patient and he states that the medication has started to help his headache but it is still there. He has had no nausea or vomiting since the medication for nausea was given.   @ 2220 Dr. Leonides Schanz in to examine the patient. The patient states he is feeling better and ready to go home. His wife is with him and states he looks better. Patient states that the medication we gave him for his headache worked. Full range of motion of neck without pain. He has had no fever while in the ED.  MDM  48 y.o. male with headache and hx of fevers. Nausea and vomiting and body aches s/p tick bites last week. Nausea and headache resolved with IV decadron, benadryl and Reglan. Stable for discharge without symptoms at this time. Will treat with doxycycline to  cover for RMSF while blood work pending. He will return for worsening symptoms.     Medication List    TAKE these medications       doxycycline 100 MG capsule  Commonly known as:  VIBRAMYCIN  Take 1 capsule (100 mg total) by mouth 2 (two) times daily.      ASK your doctor about these medications       albuterol 108 (90 BASE) MCG/ACT inhaler  Commonly known as:  PROVENTIL HFA;VENTOLIN HFA  Inhale 2 puffs into the lungs every 6 (six) hours as needed for wheezing or shortness of breath.     aspirin EC 81 MG tablet  Take 1 tablet (81 mg total) by mouth daily.     atorvastatin 20 MG tablet  Commonly known as:  LIPITOR  Take 20 mg by mouth daily.     HYDROcodone-acetaminophen 5-325 MG per tablet  Commonly known as:  NORCO/VICODIN  Take 2 tablets by  mouth every 4 (four) hours as needed.     ibuprofen 200 MG tablet  Commonly known as:  ADVIL,MOTRIN  Take 800 mg by mouth every 6 (six) hours as needed for pain.     insulin glargine 100 UNIT/ML injection  Commonly known as:  LANTUS  Inject 60 Units into the skin 2 (two) times daily.     INVOKANA 300 MG Tabs  Generic drug:  Canagliflozin  Take 300 mg by mouth daily.     lisinopril-hydrochlorothiazide 20-12.5 MG per tablet  Commonly known as:  PRINZIDE,ZESTORETIC  Take 1 tablet by mouth daily.     ondansetron 4 MG disintegrating tablet  Commonly known as:  ZOFRAN ODT  Take 1 tablet (4 mg total) by mouth every 8 (eight) hours as needed for nausea.            [place], [place] 12/28/13 [phone]

## 2013-12-27 NOTE — Discharge Instructions (Signed)
We do not have the results of your test for RMSF. If the results come back positive we will call you. However, we are treating you with antibiotics to cover you. Follow up with your doctor return here as needed.

## 2013-12-27 NOTE — ED Notes (Signed)
Fever, headache, body aches and nausea x 2 days.

## 2013-12-28 LAB — ROCKY MTN SPOTTED FVR AB, IGG-BLOOD: RMSF IGG: 0.1 IV

## 2013-12-28 NOTE — ED Provider Notes (Signed)
Medical screening examination/treatment/procedure(s) were conducted as a shared visit with non-physician practitioner(s) and myself.  I personally evaluated the patient during the encounter.   EKG Interpretation None      Pt is a 48 y.o. M with history of hypertension, diabetes, migraine headaches who presents emergency department with complaints of left-sided throbbing headache that is typical of his prior migraines. He states that he did have one episode of night sweats several days ago but no fevers or chills. No numbness or focal weakness. 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 He states he did have 2 ticks on him last week but is not sure if the pain him or not. There's not been any rash. On exam, patient is hemodynamically stable and afebrile, well-appearing, nontoxic, no meningismus, neurologically intact. No signs of rash. He states his pain is improved with IV fluids, Decadron, Reglan.  His labs have been unremarkable except for hyperglycemia which is improved with IV fluids. His head CT is also negative. Have discussed with family that the suspicion that this is meningitis and they agree. Patient reports that this feels like his typical migraine headache. He states he is feeling much better and is ready for discharge home. Patient's wife is concerned given he states bites and is requesting an antibiotic today. Have discussed with her that his risk of infection is low but given his one episode of night sweats, will treat with doxycycline. Discussed strict return precautions. They verbalize understanding and are comfortable plan.  [place], DO 12/28/13 0005

## 2014-03-07 ENCOUNTER — Telehealth: Payer: Self-pay | Admitting: Internal Medicine

## 2014-03-07 MED ORDER — LISINOPRIL-HYDROCHLOROTHIAZIDE 20-12.5 MG PO TABS: 1.0000 | ORAL_TABLET | Freq: Every day | ORAL | Status: DC

## 2014-03-07 NOTE — Telephone Encounter (Signed)
Rx refill sent to patient pharmacy   

## 2014-03-07 NOTE — Telephone Encounter (Signed)
Need refill on his Lisinopril 20/12.5 mg # 30.

## 2014-04-27 ENCOUNTER — Encounter (HOSPITAL_BASED_OUTPATIENT_CLINIC_OR_DEPARTMENT_OTHER): Payer: Self-pay | Admitting: Emergency Medicine

## 2014-04-27 ENCOUNTER — Emergency Department (HOSPITAL_BASED_OUTPATIENT_CLINIC_OR_DEPARTMENT_OTHER): Payer: Medicare PPO

## 2014-04-27 ENCOUNTER — Emergency Department (HOSPITAL_BASED_OUTPATIENT_CLINIC_OR_DEPARTMENT_OTHER)
Admission: EM | Admit: 2014-04-27 | Discharge: 2014-04-27 | Disposition: A | Discharge status: Home / Self Care | Payer: Medicare PPO | Attending: Emergency Medicine | Admitting: Emergency Medicine

## 2014-04-27 DIAGNOSIS — I10 Essential (primary) hypertension: Secondary | ICD-10-CM | POA: Diagnosis not present

## 2014-04-27 DIAGNOSIS — K219 Gastro-esophageal reflux disease without esophagitis: Secondary | ICD-10-CM | POA: Diagnosis not present

## 2014-04-27 DIAGNOSIS — Z791 Long term (current) use of non-steroidal anti-inflammatories (NSAID): Secondary | ICD-10-CM | POA: Diagnosis not present

## 2014-04-27 DIAGNOSIS — Z87442 Personal history of urinary calculi: Secondary | ICD-10-CM | POA: Insufficient documentation

## 2014-04-27 DIAGNOSIS — E785 Hyperlipidemia, unspecified: Secondary | ICD-10-CM | POA: Insufficient documentation

## 2014-04-27 DIAGNOSIS — Z7982 Long term (current) use of aspirin: Secondary | ICD-10-CM | POA: Diagnosis not present

## 2014-04-27 DIAGNOSIS — Z794 Long term (current) use of insulin: Secondary | ICD-10-CM | POA: Insufficient documentation

## 2014-04-27 DIAGNOSIS — Z9889 Other specified postprocedural states: Secondary | ICD-10-CM | POA: Diagnosis not present

## 2014-04-27 DIAGNOSIS — Z79899 Other long term (current) drug therapy: Secondary | ICD-10-CM | POA: Diagnosis not present

## 2014-04-27 DIAGNOSIS — Z87891 Personal history of nicotine dependence: Secondary | ICD-10-CM | POA: Insufficient documentation

## 2014-04-27 DIAGNOSIS — E119 Type 2 diabetes mellitus without complications: Secondary | ICD-10-CM | POA: Diagnosis not present

## 2014-04-27 DIAGNOSIS — Z9119 Patient's noncompliance with other medical treatment and regimen: Secondary | ICD-10-CM | POA: Diagnosis not present

## 2014-04-27 DIAGNOSIS — M5412 Radiculopathy, cervical region: Secondary | ICD-10-CM | POA: Diagnosis not present

## 2014-04-27 DIAGNOSIS — Z792 Long term (current) use of antibiotics: Secondary | ICD-10-CM | POA: Insufficient documentation

## 2014-04-27 DIAGNOSIS — J45909 Unspecified asthma, uncomplicated: Secondary | ICD-10-CM | POA: Insufficient documentation

## 2014-04-27 DIAGNOSIS — F320 Major depressive disorder, single episode, mild: Secondary | ICD-10-CM | POA: Diagnosis not present

## 2014-04-27 DIAGNOSIS — M79601 Pain in right arm: Secondary | ICD-10-CM | POA: Diagnosis present

## 2014-04-27 MED ORDER — CYCLOBENZAPRINE HCL 10 MG PO TABS: 10.0000 mg | ORAL_TABLET | Freq: Two times a day (BID) | ORAL | Status: DC | PRN

## 2014-04-27 MED ORDER — HYDROCODONE-ACETAMINOPHEN 5-325 MG PO TABS: 1.0000 | ORAL_TABLET | ORAL | Status: DC | PRN

## 2014-04-27 MED ORDER — NAPROXEN 500 MG PO TABS: 500.0000 mg | ORAL_TABLET | Freq: Two times a day (BID) | ORAL | Status: DC

## 2014-04-27 NOTE — Discharge Instructions (Signed)

## 2014-04-27 NOTE — ED Notes (Signed)
C/o right arm pain since last week and right hand swelling. Pain starts in shoulder and comes down arm. No known injury.

## 2014-04-27 NOTE — ED Provider Notes (Signed)
CSN: [id number]     Arrival date & time 04/27/14  0737 History   First MD Initiated Contact with Patient 04/27/14 [phone]     Chief Complaint  Patient presents with  . Arm Pain    HPI The patient presents to the emergency room with complaints of right arm pain for the past week. He does not recall any particular injuries or anything that triggered it. The pain starts in the right trapezius area and goes all the way down towards his hand. The pain is sharp and aching. He feels like his muscles and hand are tight. He feels like his grip strength is decreased.  He denies any chest pain or shortness of breath. No history of similar symptoms in the past. He denies fevers chills nausea vomiting or other complaints. Past Medical History  Diagnosis Date  . Diabetes mellitus   . Hyperlipidemia   . Hypertension   . GERD (gastroesophageal reflux disease)   . History of kidney stones   . Depression   . Asthma   . Kidney disease     stones  . Noncompliance with diabetes treatment    Past Surgical History  Procedure Laterality Date  . Appendectomy  2004  . Cholecystectomy  2004  . Leg surgery    . Strabismus surgery    . Eye surgery  1970  . Knee cartilage surgery    . Skin graft  1974  . Colonoscopy N/A 09/08/2012    Procedure: COLONOSCOPY;  Surgeon: Leighton Ruff, MD;  Location: [facility];  Service: Endoscopy;  Laterality: N/A;  . Cardiac catheterization      Dr. Verl Blalock ([facility])   Family History  Problem Relation Age of Onset  . Diabetes Mother   . Heart attack Mother   . Stroke Mother   . Diabetes Father   . Heart disease Other     Parent  . Heart disease Other     Grandparents   History  Substance Use Topics  . Smoking status: Former Smoker -- 4 years    Types: Cigarettes  . Smokeless tobacco: Current User    Types: Snuff  . Alcohol Use: No    Review of Systems  All other systems reviewed and are negative.     Allergies  Byetta 10 mcg pen; Codeine; and  Victoza  Home Medications   Prior to Admission medications   Medication Sig Start Date End Date Taking? Authorizing Provider  albuterol (PROVENTIL HFA;VENTOLIN HFA) 108 (90 BASE) MCG/ACT inhaler Inhale 2 puffs into the lungs every 6 (six) hours as needed for wheezing or shortness of breath.   Yes Historical Provider, MD  aspirin EC 81 MG tablet Take 1 tablet (81 mg total) by mouth daily. 01/07/13  Yes Pixie Casino, MD  atorvastatin (LIPITOR) 20 MG tablet Take 20 mg by mouth daily.   Yes Historical Provider, MD  Canagliflozin (INVOKANA) 300 MG TABS Take 300 mg by mouth daily.   Yes Historical Provider, MD  insulin glargine (LANTUS) 100 UNIT/ML injection Inject 60 Units into the skin 2 (two) times daily.    Yes Historical Provider, MD  lisinopril-hydrochlorothiazide (PRINZIDE,ZESTORETIC) 20-12.5 MG per tablet Take 1 tablet by mouth daily. *Patient MUST MAKE APPOINTMENT* 03/07/14  Yes Pixie Casino, MD  cyclobenzaprine (FLEXERIL) 10 MG tablet Take 1 tablet (10 mg total) by mouth 2 (two) times daily as needed for muscle spasms. 04/27/14   Dorie Rank, MD  doxycycline (VIBRAMYCIN) 100 MG capsule Take 1 capsule (100 mg total) by  mouth 2 (two) times daily. 12/27/13   Hope Bunnie Pion, NP  HYDROcodone-acetaminophen (NORCO/VICODIN) 5-325 MG per tablet Take 1-2 tablets by mouth every 4 (four) hours as needed. 04/27/14   Dorie Rank, MD  naproxen (NAPROSYN) 500 MG tablet Take 1 tablet (500 mg total) by mouth 2 (two) times daily. 04/27/14   Dorie Rank, MD  ondansetron (ZOFRAN ODT) 4 MG disintegrating tablet Take 1 tablet (4 mg total) by mouth every 8 (eight) hours as needed for nausea. 07/05/13   Tanna Furry, MD   BP 122/73  Pulse 78  Temp(Src) 98.1 F (36.7 C) (Oral)  Resp 16  Ht 5\' 5"  (1.651 m)  Wt 173 lb (78.472 kg)  BMI 28.79 kg/m2  SpO2 99% Physical Exam  Nursing note and vitals reviewed. Constitutional: He appears well-developed and well-nourished. No distress.  HENT:  Head: Normocephalic and  atraumatic.  Right Ear: External ear normal.  Left Ear: External ear normal.  Eyes: Conjunctivae are normal. Right eye exhibits no discharge. Left eye exhibits no discharge. No scleral icterus.  Neck: Neck supple. No tracheal deviation present.  Cardiovascular: Normal rate, regular rhythm and normal heart sounds.  Exam reveals no friction rub.   No murmur heard. Pulmonary/Chest: Effort normal and breath sounds normal. No stridor. No respiratory distress.  Musculoskeletal: He exhibits no edema.       Right shoulder: Normal. He exhibits normal range of motion, no bony tenderness and no swelling.       Right elbow: He exhibits no swelling and no effusion.       Right wrist: He exhibits no bony tenderness, no swelling and no effusion.       Cervical back: He exhibits no bony tenderness and no deformity.  Tenderness to palpation right trapezius area and right paraspinal  Neurological: He is alert. Cranial nerve deficit: no gross deficits.  4-5 grip strength the right hand, 5 over 5 biceps, fortified cuts up  Skin: Skin is warm and dry. No rash noted.  Psychiatric: He has a normal mood and affect.    ED Course  Procedures (including critical care time) Labs Review Labs Reviewed - No data to display  Imaging Review Dg Cervical Spine Complete  04/27/2014   CLINICAL DATA:  Right arm pain for 2 weeks. No acute injury. Initial evaluation.  EXAM: CERVICAL SPINE  4+ VIEWS  COMPARISON:  None.  FINDINGS: Carotid atherosclerotic vascular calcification. Soft tissue structures are otherwise unremarkable. Diffuse mild degenerative change. No acute bony or joint abnormality identified. Pulmonary apices are clear.  IMPRESSION: 1. Diffuse mild degenerative change. No acute abnormality identified.  2.  Carotid atherosclerotic vascular calcification.   Electronically Signed   By: Marcello Moores  Register   On: 04/27/2014 09:41     MDM   Final diagnoses:  Cervical radiculopathy    Possible C7 C8 radiculopathy  symptoms.   No central symptoms.  Will dc home with pain meds.  Follow up with PCP to discuss further testing ie MRI if symptoms persist.  Will avoid steroids with his DM   Dorie Rank, MD 04/27/14 1003

## 2014-05-09 ENCOUNTER — Other Ambulatory Visit (HOSPITAL_COMMUNITY): Payer: Self-pay | Admitting: Family Medicine

## 2014-05-09 DIAGNOSIS — M542 Cervicalgia: Secondary | ICD-10-CM

## 2014-05-12 ENCOUNTER — Ambulatory Visit (HOSPITAL_COMMUNITY)
Admission: RE | Admit: 2014-05-12 | Discharge: 2014-05-12 | Disposition: A | Discharge status: Home / Self Care | Payer: Commercial Managed Care - HMO | Source: Ambulatory Visit | Attending: Family Medicine | Admitting: Family Medicine

## 2014-05-12 DIAGNOSIS — M5031 Other cervical disc degeneration,  high cervical region: Secondary | ICD-10-CM | POA: Insufficient documentation

## 2014-05-12 DIAGNOSIS — M5022 Other cervical disc displacement, mid-cervical region: Secondary | ICD-10-CM | POA: Diagnosis not present

## 2014-05-12 DIAGNOSIS — R531 Weakness: Secondary | ICD-10-CM | POA: Diagnosis present

## 2014-05-12 DIAGNOSIS — M542 Cervicalgia: Secondary | ICD-10-CM

## 2014-05-12 DIAGNOSIS — M2578 Osteophyte, vertebrae: Secondary | ICD-10-CM | POA: Insufficient documentation

## 2014-05-12 DIAGNOSIS — R200 Anesthesia of skin: Secondary | ICD-10-CM | POA: Diagnosis present

## 2014-05-12 DIAGNOSIS — M79601 Pain in right arm: Secondary | ICD-10-CM | POA: Diagnosis present

## 2014-05-12 DIAGNOSIS — M4802 Spinal stenosis, cervical region: Secondary | ICD-10-CM | POA: Diagnosis not present

## 2014-06-01 ENCOUNTER — Other Ambulatory Visit (HOSPITAL_COMMUNITY): Payer: Self-pay | Admitting: Neurosurgery

## 2014-06-13 ENCOUNTER — Encounter (HOSPITAL_COMMUNITY)
Admission: RE | Admit: 2014-06-13 | Discharge: 2014-06-13 | Disposition: A | Discharge status: Home / Self Care | Payer: Medicare Other | Source: Ambulatory Visit | Attending: Neurosurgery | Admitting: Neurosurgery

## 2014-06-13 ENCOUNTER — Encounter (HOSPITAL_COMMUNITY): Payer: Self-pay

## 2014-06-13 DIAGNOSIS — E119 Type 2 diabetes mellitus without complications: Secondary | ICD-10-CM | POA: Insufficient documentation

## 2014-06-13 DIAGNOSIS — G4733 Obstructive sleep apnea (adult) (pediatric): Secondary | ICD-10-CM | POA: Diagnosis not present

## 2014-06-13 DIAGNOSIS — I10 Essential (primary) hypertension: Secondary | ICD-10-CM | POA: Diagnosis not present

## 2014-06-13 DIAGNOSIS — Z01818 Encounter for other preprocedural examination: Secondary | ICD-10-CM | POA: Diagnosis present

## 2014-06-13 DIAGNOSIS — J45909 Unspecified asthma, uncomplicated: Secondary | ICD-10-CM | POA: Diagnosis not present

## 2014-06-13 DIAGNOSIS — E785 Hyperlipidemia, unspecified: Secondary | ICD-10-CM | POA: Diagnosis not present

## 2014-06-13 HISTORY — DX: Sleep apnea, unspecified: G47.30

## 2014-06-13 LAB — BASIC METABOLIC PANEL
Anion gap: 13 (ref 5–15)
BUN: 16 mg/dL (ref 6–23)
CALCIUM: 9.8 mg/dL (ref 8.4–10.5)
CO2: 28 meq/L (ref 19–32)
CREATININE: 0.95 mg/dL (ref 0.50–1.35)
Chloride: 99 mEq/L (ref 96–112)
GFR calc Af Amer: >90 mL/min (ref >90)
GFR calc non Af Amer: >90 mL/min (ref >90)
Glucose, Bld: 90 mg/dL (ref 70–99)
Potassium: 4 mEq/L (ref 3.7–5.3)
SODIUM: 140 meq/L (ref 137–147)

## 2014-06-13 LAB — SURGICAL PCR SCREEN
MRSA, PCR: NEGATIVE
STAPHYLOCOCCUS AUREUS: POSITIVE — AB

## 2014-06-13 LAB — CBC
HEMATOCRIT: 47.3 % (ref 39.0–52.0)
Hemoglobin: 16.1 g/dL (ref 13.0–17.0)
MCH: 29.8 pg (ref 26.0–34.0)
MCHC: 34 g/dL (ref 30.0–36.0)
MCV: 87.6 fL (ref 78.0–100.0)
Platelets: 300 10*3/uL (ref 150–400)
RBC: 5.4 MIL/uL (ref 4.22–5.81)
RDW: 13.3 % (ref 11.5–15.5)
WBC: 8 10*3/uL (ref 4.0–10.5)

## 2014-06-13 NOTE — Progress Notes (Signed)
Anesthesia Chart Review:  Pt is 48 year old male scheduled for C5-6 anterior cervical decompression/discectomy on 06/24/2014 with Dr. Kathyrn Sheriff.   PMH: HTN, DM, hyperlipidemia, asthma, OSA   Medications include: ASA, lisinopril/hctz, atorvastatin, insulin, albuterol, canaglifozin  Preoperative labs reviewed.    Chest x-ray 12/27/2013 reviewed.  No active cardiopulmonary disease.   EKG: NSR. Nonspecific T wave abnormality.   Nuclear stress test 01/27/2013: Overall Impression: Normal stress nuclear study. and Low risk  stress nuclear study with no scintigraphic evidence of ischemia  or infarfction. LV Wall Motion: NL LV Function; NL Wall Motion  2D echo 01/27/2013: - Left ventricle: The cavity size was normal. Systolic function was normal. The estimated ejection fraction was in the range of 55% to 60%. Wall motion was normal; there were no regional wall motion abnormalities. Doppler parameters are consistent with abnormal left ventricular relaxation (grade 1 diastolic dysfunction). - Atrial septum: The septum was thickened. Impressions:  No significant heart disease.  Saw [facility] in 2014 for cardiac evaluation for poorly controlled diabetes. Does not regularly see cardiology.   If no changes, I anticipate pt can proceed with surgery as scheduled.   Willeen Cass, FNP-BC [facility]/Anesthesiology Phone: [phone] 06/13/2014 3:33 PM

## 2014-06-23 MED ORDER — CEFAZOLIN SODIUM-DEXTROSE 2-3 GM-% IV SOLR
2.0000 g | INTRAVENOUS | Status: AC
  Administered 2014-06-24: 2 g via INTRAVENOUS
  Filled 2014-06-23: qty 50

## 2014-06-23 NOTE — Discharge Instructions (Signed)
Wound Care Keep incision covered and dry for two days.   Do not put any creams, lotions, or ointments on incision. Leave steri-strips on neck.  They will fall off by themselves. Activity Walk each and every day, increasing distance each day. No lifting greater than 5 lbs.  Avoid excessive neck motion. No driving for 2 weeks; may ride as a passenger locally. Wear collar at all times except for shower. Diet Resume your normal diet.  Return to Work Will be discussed at you follow up appointment. Call Your Doctor If Any of These Occur Redness, drainage, or swelling at the wound.  Temperature greater than 101 degrees. Severe pain not relieved by pain medication. Incision starts to come apart. Follow Up Appt Call today for appointment in 1-2 weeks ([phone]) or for problems.  If you have any hardware placed in your spine, you will need an x-ray before your appointment.

## 2014-06-24 ENCOUNTER — Encounter (HOSPITAL_COMMUNITY): Payer: Self-pay | Admitting: *Deleted

## 2014-06-24 ENCOUNTER — Inpatient Hospital Stay (HOSPITAL_COMMUNITY): Payer: Medicare Other | Admitting: Emergency Medicine

## 2014-06-24 ENCOUNTER — Encounter (HOSPITAL_COMMUNITY)
Admission: RE | Disposition: A | Discharge status: Home / Self Care | Payer: Self-pay | Source: Ambulatory Visit | Attending: Neurosurgery

## 2014-06-24 ENCOUNTER — Observation Stay (HOSPITAL_COMMUNITY)
Admission: RE | Admit: 2014-06-24 | Discharge: 2014-06-24 | Disposition: A | Discharge status: Home / Self Care | Payer: Medicare Other | Source: Ambulatory Visit | Attending: Neurosurgery | Admitting: Neurosurgery

## 2014-06-24 ENCOUNTER — Inpatient Hospital Stay (HOSPITAL_COMMUNITY): Payer: Medicare Other | Admitting: Anesthesiology

## 2014-06-24 ENCOUNTER — Inpatient Hospital Stay (HOSPITAL_COMMUNITY): Payer: Medicare Other

## 2014-06-24 DIAGNOSIS — M4722 Other spondylosis with radiculopathy, cervical region: Secondary | ICD-10-CM | POA: Diagnosis present

## 2014-06-24 DIAGNOSIS — I10 Essential (primary) hypertension: Secondary | ICD-10-CM | POA: Insufficient documentation

## 2014-06-24 DIAGNOSIS — E119 Type 2 diabetes mellitus without complications: Secondary | ICD-10-CM | POA: Diagnosis not present

## 2014-06-24 DIAGNOSIS — Z87891 Personal history of nicotine dependence: Secondary | ICD-10-CM | POA: Insufficient documentation

## 2014-06-24 DIAGNOSIS — Z419 Encounter for procedure for purposes other than remedying health state, unspecified: Secondary | ICD-10-CM

## 2014-06-24 DIAGNOSIS — G4730 Sleep apnea, unspecified: Secondary | ICD-10-CM | POA: Insufficient documentation

## 2014-06-24 DIAGNOSIS — K219 Gastro-esophageal reflux disease without esophagitis: Secondary | ICD-10-CM | POA: Insufficient documentation

## 2014-06-24 DIAGNOSIS — F319 Bipolar disorder, unspecified: Secondary | ICD-10-CM | POA: Insufficient documentation

## 2014-06-24 DIAGNOSIS — F329 Major depressive disorder, single episode, unspecified: Secondary | ICD-10-CM | POA: Insufficient documentation

## 2014-06-24 HISTORY — PX: ANTERIOR CERVICAL DECOMP/DISCECTOMY FUSION: SHX1161

## 2014-06-24 LAB — CBC
HEMATOCRIT: 45 % (ref 39.0–52.0)
HEMOGLOBIN: 15 g/dL (ref 13.0–17.0)
MCH: 29.2 pg (ref 26.0–34.0)
MCHC: 33.3 g/dL (ref 30.0–36.0)
MCV: 87.5 fL (ref 78.0–100.0)
Platelets: 270 10*3/uL (ref 150–400)
RBC: 5.14 MIL/uL (ref 4.22–5.81)
RDW: 13.2 % (ref 11.5–15.5)
WBC: 15.2 10*3/uL — AB (ref 4.0–10.5)

## 2014-06-24 LAB — CREATININE, SERUM
Creatinine, Ser: 0.95 mg/dL (ref 0.50–1.35)
GFR calc Af Amer: >90 mL/min (ref >90)

## 2014-06-24 LAB — GLUCOSE, CAPILLARY
GLUCOSE-CAPILLARY: 95 mg/dL (ref 70–99)
GLUCOSE-CAPILLARY: 105 mg/dL — AB (ref 70–99)
Glucose-Capillary: 71 mg/dL (ref 70–99)
Glucose-Capillary: 67 mg/dL — ABNORMAL LOW (ref 70–99)
Glucose-Capillary: 116 mg/dL — ABNORMAL HIGH (ref 70–99)

## 2014-06-24 SURGERY — ANTERIOR CERVICAL DECOMPRESSION/DISCECTOMY FUSION 1 LEVEL
Anesthesia: General | Site: Neck

## 2014-06-24 MED ORDER — HYDROMORPHONE HCL 1 MG/ML IJ SOLN
0.2500 mg | INTRAMUSCULAR | Status: DC | PRN
  Administered 2014-06-24 (×2): 0.5 mg via INTRAVENOUS

## 2014-06-24 MED ORDER — DEXAMETHASONE SODIUM PHOSPHATE 10 MG/ML IJ SOLN
INTRAMUSCULAR | Status: AC
  Filled 2014-06-24: qty 1

## 2014-06-24 MED ORDER — OXYCODONE-ACETAMINOPHEN 5-325 MG PO TABS: 1.0000 | ORAL_TABLET | ORAL | Status: DC | PRN

## 2014-06-24 MED ORDER — 0.9 % SODIUM CHLORIDE (POUR BTL) OPTIME
TOPICAL | Status: DC | PRN
  Administered 2014-06-24: 1000 mL

## 2014-06-24 MED ORDER — OXYCODONE-ACETAMINOPHEN 5-325 MG PO TABS
1.0000 | ORAL_TABLET | ORAL | Status: DC | PRN
  Administered 2014-06-24: 2 via ORAL

## 2014-06-24 MED ORDER — SODIUM CHLORIDE 0.9 % IV SOLN: INTRAVENOUS | Status: DC

## 2014-06-24 MED ORDER — SENNA 8.6 MG PO TABS: 1.0000 | ORAL_TABLET | Freq: Two times a day (BID) | ORAL | Status: DC

## 2014-06-24 MED ORDER — PHENYLEPHRINE HCL 10 MG/ML IJ SOLN
INTRAMUSCULAR | Status: DC | PRN
  Administered 2014-06-24 (×4): 80 ug via INTRAVENOUS

## 2014-06-24 MED ORDER — LIDOCAINE HCL 4 % MT SOLN
OROMUCOSAL | Status: DC | PRN
  Administered 2014-06-24: 4 mL via TOPICAL

## 2014-06-24 MED ORDER — BUPIVACAINE HCL 0.5 % IJ SOLN
INTRAMUSCULAR | Status: DC | PRN
  Administered 2014-06-24: 3.5 mL

## 2014-06-24 MED ORDER — PROPOFOL 10 MG/ML IV BOLUS
INTRAVENOUS | Status: AC
  Filled 2014-06-24: qty 20

## 2014-06-24 MED ORDER — MORPHINE SULFATE 2 MG/ML IJ SOLN
1.0000 mg | INTRAMUSCULAR | Status: DC | PRN
  Administered 2014-06-24: 2 mg via INTRAVENOUS

## 2014-06-24 MED ORDER — LACTATED RINGERS IV SOLN
INTRAVENOUS | Status: DC | PRN
  Administered 2014-06-24 (×2): via INTRAVENOUS

## 2014-06-24 MED ORDER — LIDOCAINE-EPINEPHRINE 1 %-1:100000 IJ SOLN
INTRAMUSCULAR | Status: DC | PRN
  Administered 2014-06-24: 3.5 mL

## 2014-06-24 MED ORDER — ASPIRIN EC 81 MG PO TBEC: 81.0000 mg | DELAYED_RELEASE_TABLET | Freq: Every day | ORAL | Status: DC

## 2014-06-24 MED ORDER — ONDANSETRON HCL 4 MG/2ML IJ SOLN
INTRAMUSCULAR | Status: DC | PRN
  Administered 2014-06-24: 4 mg via INTRAVENOUS

## 2014-06-24 MED ORDER — FENTANYL CITRATE 0.05 MG/ML IJ SOLN
INTRAMUSCULAR | Status: DC | PRN
  Administered 2014-06-24: 100 ug via INTRAVENOUS

## 2014-06-24 MED ORDER — HYDROMORPHONE HCL 1 MG/ML IJ SOLN
INTRAMUSCULAR | Status: AC
  Filled 2014-06-24: qty 2

## 2014-06-24 MED ORDER — DIAZEPAM 5 MG PO TABS
ORAL_TABLET | ORAL | Status: AC
  Filled 2014-06-24: qty 1

## 2014-06-24 MED ORDER — NEOSTIGMINE METHYLSULFATE 10 MG/10ML IV SOLN
INTRAVENOUS | Status: DC | PRN
Start: 2014-06-24 — End: 2014-06-24
  Administered 2014-06-24: 4 mg via INTRAVENOUS

## 2014-06-24 MED ORDER — HEMOSTATIC AGENTS (NO CHARGE) OPTIME
TOPICAL | Status: DC | PRN
  Administered 2014-06-24: 1 via TOPICAL

## 2014-06-24 MED ORDER — PHENOL 1.4 % MT LIQD: 1.0000 | OROMUCOSAL | Status: DC | PRN

## 2014-06-24 MED ORDER — DEXTROSE 50 % IV SOLN
INTRAVENOUS | Status: DC | PRN
  Administered 2014-06-24: .5 via INTRAVENOUS

## 2014-06-24 MED ORDER — ONDANSETRON HCL 4 MG/2ML IJ SOLN: 4.0000 mg | INTRAMUSCULAR | Status: DC | PRN

## 2014-06-24 MED ORDER — OXYCODONE-ACETAMINOPHEN 5-325 MG PO TABS
ORAL_TABLET | ORAL | Status: AC
  Filled 2014-06-24: qty 2

## 2014-06-24 MED ORDER — FENTANYL CITRATE 0.05 MG/ML IJ SOLN
INTRAMUSCULAR | Status: AC
  Filled 2014-06-24: qty 5

## 2014-06-24 MED ORDER — SODIUM CHLORIDE 0.9 % IJ SOLN
3.0000 mL | Freq: Two times a day (BID) | INTRAMUSCULAR | Status: DC
  Administered 2014-06-24: 3 mL via INTRAVENOUS

## 2014-06-24 MED ORDER — HEPARIN SODIUM (PORCINE) 5000 UNIT/ML IJ SOLN
5000.0000 [IU] | Freq: Three times a day (TID) | INTRAMUSCULAR | Status: DC
  Filled 2014-06-24: qty 1

## 2014-06-24 MED ORDER — ROCURONIUM BROMIDE 100 MG/10ML IV SOLN
INTRAVENOUS | Status: DC | PRN
  Administered 2014-06-24: 50 mg via INTRAVENOUS

## 2014-06-24 MED ORDER — SODIUM CHLORIDE 0.9 % IJ SOLN: 3.0000 mL | INTRAMUSCULAR | Status: DC | PRN

## 2014-06-24 MED ORDER — PROPOFOL 10 MG/ML IV BOLUS
INTRAVENOUS | Status: DC | PRN
  Administered 2014-06-24: 180 mg via INTRAVENOUS

## 2014-06-24 MED ORDER — ACETAMINOPHEN 325 MG PO TABS: 650.0000 mg | ORAL_TABLET | ORAL | Status: DC | PRN

## 2014-06-24 MED ORDER — GLYCOPYRROLATE 0.2 MG/ML IJ SOLN
INTRAMUSCULAR | Status: DC | PRN
  Administered 2014-06-24: 0.6 mg via INTRAVENOUS

## 2014-06-24 MED ORDER — DIAZEPAM 5 MG PO TABS
5.0000 mg | ORAL_TABLET | Freq: Four times a day (QID) | ORAL | Status: DC | PRN
  Administered 2014-06-24: 5 mg via ORAL

## 2014-06-24 MED ORDER — MENTHOL 3 MG MT LOZG: 1.0000 | LOZENGE | OROMUCOSAL | Status: DC | PRN

## 2014-06-24 MED ORDER — DEXAMETHASONE SODIUM PHOSPHATE 10 MG/ML IJ SOLN
INTRAMUSCULAR | Status: DC | PRN
  Administered 2014-06-24: 10 mg via INTRAVENOUS

## 2014-06-24 MED ORDER — DIAZEPAM 5 MG PO TABS: 5.0000 mg | ORAL_TABLET | Freq: Four times a day (QID) | ORAL | Status: DC | PRN

## 2014-06-24 MED ORDER — MIDAZOLAM HCL 2 MG/2ML IJ SOLN
INTRAMUSCULAR | Status: AC
  Filled 2014-06-24: qty 2

## 2014-06-24 MED ORDER — SODIUM CHLORIDE 0.9 % IR SOLN
Status: DC | PRN
  Administered 2014-06-24: 09:00:00

## 2014-06-24 MED ORDER — GLYCOPYRROLATE 0.2 MG/ML IJ SOLN
INTRAMUSCULAR | Status: AC
  Filled 2014-06-24: qty 3

## 2014-06-24 MED ORDER — MIDAZOLAM HCL 5 MG/5ML IJ SOLN
INTRAMUSCULAR | Status: DC | PRN
  Administered 2014-06-24: 2 mg via INTRAVENOUS

## 2014-06-24 MED ORDER — OXYCODONE HCL 5 MG PO TABS: 5.0000 mg | ORAL_TABLET | Freq: Once | ORAL | Status: DC | PRN

## 2014-06-24 MED ORDER — ACETAMINOPHEN 650 MG RE SUPP: 650.0000 mg | RECTAL | Status: DC | PRN

## 2014-06-24 MED ORDER — OXYCODONE HCL 5 MG/5ML PO SOLN: 5.0000 mg | Freq: Once | ORAL | Status: DC | PRN

## 2014-06-24 MED ORDER — CEFAZOLIN SODIUM 1-5 GM-% IV SOLN
1.0000 g | Freq: Three times a day (TID) | INTRAVENOUS | Status: DC
  Administered 2014-06-24: 1 g via INTRAVENOUS
  Filled 2014-06-24 (×2): qty 50

## 2014-06-24 MED ORDER — LIDOCAINE HCL (CARDIAC) 20 MG/ML IV SOLN
INTRAVENOUS | Status: DC | PRN
  Administered 2014-06-24: 60 mg via INTRAVENOUS

## 2014-06-24 MED ORDER — THROMBIN 5000 UNITS EX SOLR
OROMUCOSAL | Status: DC | PRN
  Administered 2014-06-24: 10:00:00 via TOPICAL

## 2014-06-24 MED ORDER — DEXTROSE 50 % IV SOLN
INTRAVENOUS | Status: AC
  Filled 2014-06-24: qty 50

## 2014-06-24 MED ORDER — ONDANSETRON HCL 4 MG/2ML IJ SOLN: 4.0000 mg | Freq: Four times a day (QID) | INTRAMUSCULAR | Status: DC | PRN

## 2014-06-24 MED ORDER — THROMBIN 5000 UNITS EX SOLR
CUTANEOUS | Status: DC | PRN
  Administered 2014-06-24 (×2): 5000 [IU] via TOPICAL

## 2014-06-24 MED ORDER — DOCUSATE SODIUM 100 MG PO CAPS
100.0000 mg | ORAL_CAPSULE | Freq: Two times a day (BID) | ORAL | Status: DC
  Filled 2014-06-24 (×2): qty 1

## 2014-06-24 MED ORDER — PHENYLEPHRINE 40 MCG/ML (10ML) SYRINGE FOR IV PUSH (FOR BLOOD PRESSURE SUPPORT)
PREFILLED_SYRINGE | INTRAVENOUS | Status: AC
  Filled 2014-06-24: qty 10

## 2014-06-24 SURGICAL SUPPLY — 70 items
APL SKNCLS STERI-STRIP NONHPOA (GAUZE/BANDAGES/DRESSINGS)
BAG DECANTER FOR FLEXI CONT (MISCELLANEOUS) ×2 IMPLANT
BENZOIN TINCTURE PRP APPL 2/3 (GAUZE/BANDAGES/DRESSINGS) IMPLANT
BLADE CLIPPER SURG (BLADE) IMPLANT
BLADE SURG 11 STRL SS (BLADE) ×2 IMPLANT
BONE EQUIVA 5CC (Bone Implant) ×1 IMPLANT
BUR MATCHSTICK NEURO 3.0 LAGG (BURR) ×2 IMPLANT
CANISTER SUCT 3000ML (MISCELLANEOUS) ×2 IMPLANT
CONT SPEC 4OZ CLIKSEAL STRL BL (MISCELLANEOUS) ×2 IMPLANT
DECANTER SPIKE VIAL GLASS SM (MISCELLANEOUS) ×2 IMPLANT
DRAIN CHANNEL 10M FLAT 3/4 FLT (DRAIN) IMPLANT
DRAPE C-ARM 42X72 X-RAY (DRAPES) ×4 IMPLANT
DRAPE LAPAROTOMY 100X72 PEDS (DRAPES) ×2 IMPLANT
DRAPE MICROSCOPE LEICA (MISCELLANEOUS) ×2 IMPLANT
DRAPE POUCH INSTRU U-SHP 10X18 (DRAPES) ×2 IMPLANT
DRAPE PROXIMA HALF (DRAPES) ×1 IMPLANT
DRSG OPSITE 4X5.5 SM (GAUZE/BANDAGES/DRESSINGS) ×2 IMPLANT
DRSG OPSITE POSTOP 3X4 (GAUZE/BANDAGES/DRESSINGS) ×2 IMPLANT
DRSG TEGADERM 4X4.75 (GAUZE/BANDAGES/DRESSINGS) ×4 IMPLANT
DURAPREP 6ML APPLICATOR 50/CS (WOUND CARE) ×2 IMPLANT
ELECT COATED BLADE 2.86 ST (ELECTRODE) ×2 IMPLANT
ELECT REM PT RETURN 9FT ADLT (ELECTROSURGICAL) ×2
ELECTRODE REM PT RTRN 9FT ADLT (ELECTROSURGICAL) ×1 IMPLANT
EVACUATOR SILICONE 100CC (DRAIN) IMPLANT
GAUZE SPONGE 4X4 16PLY XRAY LF (GAUZE/BANDAGES/DRESSINGS) IMPLANT
GLOVE BIO SURGEON STRL SZ8 (GLOVE) ×1 IMPLANT
GLOVE BIOGEL PI IND STRL 7.5 (GLOVE) ×1 IMPLANT
GLOVE BIOGEL PI IND STRL 8 (GLOVE) IMPLANT
GLOVE BIOGEL PI IND STRL 8.5 (GLOVE) IMPLANT
GLOVE BIOGEL PI INDICATOR 7.5 (GLOVE) ×1
GLOVE BIOGEL PI INDICATOR 8 (GLOVE) ×3
GLOVE BIOGEL PI INDICATOR 8.5 (GLOVE) ×1
GLOVE ECLIPSE 7.0 STRL STRAW (GLOVE) ×2 IMPLANT
GLOVE ECLIPSE 7.5 STRL STRAW (GLOVE) ×3 IMPLANT
GLOVE EXAM NITRILE LRG STRL (GLOVE) IMPLANT
GLOVE EXAM NITRILE MD LF STRL (GLOVE) IMPLANT
GLOVE EXAM NITRILE XL STR (GLOVE) IMPLANT
GLOVE EXAM NITRILE XS STR PU (GLOVE) IMPLANT
GOWN STRL REUS W/ TWL LRG LVL3 (GOWN DISPOSABLE) ×2 IMPLANT
GOWN STRL REUS W/ TWL XL LVL3 (GOWN DISPOSABLE) IMPLANT
GOWN STRL REUS W/TWL 2XL LVL3 (GOWN DISPOSABLE) ×1 IMPLANT
GOWN STRL REUS W/TWL LRG LVL3 (GOWN DISPOSABLE) ×4
GOWN STRL REUS W/TWL XL LVL3 (GOWN DISPOSABLE)
HEMOSTAT POWDER SURGIFOAM 1G (HEMOSTASIS) ×2 IMPLANT
KIT BASIN OR (CUSTOM PROCEDURE TRAY) ×2 IMPLANT
KIT ROOM TURNOVER OR (KITS) ×2 IMPLANT
KNIFE ARACHNOID DISP AM-24-S (MISCELLANEOUS) ×1 IMPLANT
LIQUID BAND (GAUZE/BANDAGES/DRESSINGS) ×2 IMPLANT
NDL HYPO 25X1 1.5 SAFETY (NEEDLE) ×1 IMPLANT
NDL SPNL 22GX3.5 QUINCKE BK (NEEDLE) ×1 IMPLANT
NEEDLE HYPO 25X1 1.5 SAFETY (NEEDLE) ×2 IMPLANT
NEEDLE SPNL 22GX3.5 QUINCKE BK (NEEDLE) ×2 IMPLANT
NS IRRIG 1000ML POUR BTL (IV SOLUTION) ×2 IMPLANT
PACK LAMINECTOMY NEURO (CUSTOM PROCEDURE TRAY) ×2 IMPLANT
PAD ARMBOARD 7.5X6 YLW CONV (MISCELLANEOUS) ×6 IMPLANT
PEEK VISTA-S 11X14X6MM-7 (Peek) ×1 IMPLANT
PLATE INVIZIA 1 LEV 22MM (Plate) ×1 IMPLANT
RUBBERBAND STERILE (MISCELLANEOUS) ×4 IMPLANT
SCREW SELF DRILL VAR 14MMX4.2 (Screw) ×4 IMPLANT
SPONGE INTESTINAL PEANUT (DISPOSABLE) ×2 IMPLANT
SPONGE SURGIFOAM ABS GEL SZ50 (HEMOSTASIS) ×2 IMPLANT
STRIP CLOSURE SKIN 1/2X4 (GAUZE/BANDAGES/DRESSINGS) IMPLANT
SUT ETHILON 3 0 FSL (SUTURE) IMPLANT
SUT VIC AB 3-0 SH 8-18 (SUTURE) ×2 IMPLANT
SUT VICRYL 3-0 RB1 18 ABS (SUTURE) ×3 IMPLANT
SYR 20ML ECCENTRIC (SYRINGE) ×2 IMPLANT
TAPE CLOTH 2X10 TAN LF (GAUZE/BANDAGES/DRESSINGS) ×2 IMPLANT
TOWEL OR 17X24 6PK STRL BLUE (TOWEL DISPOSABLE) ×2 IMPLANT
TOWEL OR 17X26 10 PK STRL BLUE (TOWEL DISPOSABLE) ×2 IMPLANT
WATER STERILE IRR 1000ML POUR (IV SOLUTION) ×2 IMPLANT

## 2014-06-24 NOTE — Transfer of Care (Signed)
Immediate Anesthesia Transfer of Care Note  Patient: Levi Meyers  Procedure(s) Performed: Procedure(s) with comments: Cervical five-six anterior cervical decompression with fusion interbody prosthesis with plating and bonegraft (N/A) - Cervical five-six anterior cervical decompression with fusion interbody prosthesis with plating and bonegraft  Patient Location: PACU  Anesthesia Type:General  Level of Consciousness: awake, alert , oriented and patient cooperative  Airway & Oxygen Therapy: Patient Spontanous Breathing and Patient connected to nasal cannula oxygen  Post-op Assessment: Report given to PACU RN, Post -op Vital signs reviewed and stable and Patient moving all extremities X 4  Post vital signs: Reviewed and stable  Complications: No apparent anesthesia complications

## 2014-06-24 NOTE — Anesthesia Postprocedure Evaluation (Signed)
Anesthesia Post Note  Patient: Levi Meyers  Procedure(s) Performed: Procedure(s) (LRB): Cervical five-six anterior cervical decompression with fusion interbody prosthesis with plating and bonegraft (N/A)  Anesthesia type: General  Patient location: PACU  Post pain: Pain level controlled and Adequate analgesia  Post assessment: Post-op Vital signs reviewed, Patient's Cardiovascular Status Stable, Respiratory Function Stable, Patent Airway and Pain level controlled  Last Vitals:  Filed Vitals:   06/24/14 1045  BP: 119/77  Pulse: 95  Temp:   Resp: 16    Post vital signs: Reviewed and stable  Level of consciousness: awake, alert  and oriented  Complications: No apparent anesthesia complications

## 2014-06-24 NOTE — Op Note (Signed)
PREOP DIAGNOSIS: Cervical spondylosis with radiculopathy, C5-6  POSTOP DIAGNOSIS: Same  PROCEDURE: 1. Discectomy at C5-6 for decompression of spinal cord and exiting nerve roots  2. Placement of intervertebral biomechanical device Zimmer PEEK 54mm Lordotic cage 3. Placement of anterior instrumentation consisting of interbody plate and screws - 09NA plate, 4mm screws x4  4. Use of morselized bone allograft  5. Arthrodesis C5-6, anterior interbody technique  6. Use of intraoperative microscope  SURGEON: Dr. Consuella Lose, MD  ASSISTANT: Dr. Erline Levine, MD  ANESTHESIA: General Endotracheal  EBL: 50cc  SPECIMENS: None  DRAINS: None  COMPLICATIONS: None immediate  CONDITION: Hemodynamically stable to PACU  HISTORY: Levi Meyers is a 48 y.o. y.o. male who initially presented to the outpatient clinic with signs and symptoms consistent with right-sided C6 radiculopathy. MRI demonstrated foraminal stenosis at C5-6, worse on the right. Treatment options were discussed including continued conservative management versus surgical decompression. He elected to proceed with surgery. After all questions were answered, informed consent was obtained.  PROCEDURE IN DETAIL: The patient was brought to the operating room and transferred to the operative table. After induction of general anesthesia, the patient was positioned on the operative table in the supine position with all pressure points meticulously padded. The skin of the neck was then prepped and draped in the usual sterile fashion.  After timeout was conducted, the skin was infiltrated with local anesthetic. Skin incision was then made sharply and Bovie electrocautery was used to dissect the subcutaneous tissue until the platysma was identified. The platysma was then divided and undermined. The sternocleidomastoid muscle was then identified and, utilizing natural fascial planes in the neck, the prevertebral fascia was identified and the  carotid sheath was retracted laterally and the trachea and esophagus retracted medially. Again using fluoroscopy, the correct disc space was identified. Bovie electrocautery was used to dissect in the subperiosteal plane and elevate the bilateral longus coli muscles. Self-retaining retractors were then placed. At this point, the microscope was draped and brought into the field, and the remainder of the case was done under the microscope using microdissecting technique.  The disc space was incised sharply and rongeurs were use to initially complete a discectomy. The high-speed drill was then used to complete discectomy until the posterior annulus was identified and removed and the posterior longitudinal ligament was identified. Using a nerve hook, the PLL was elevated, and Kerrison rongeurs were used to remove the posterior longitudinal ligament and the ventral thecal sac was identified. There was disc fragment and bony osteophyte causing compression of the right C6 root. Using a combination of curettes and rongeurs, complete decompression of the thecal sac and exiting nerve roots at this level was completed, and verified using micro-nerve hook.   Having completed our decompression, attention was turned to placement of the intervertebral device. Trial spacers were used to select a 47mm lordotic graft. This graft was then filled with morcellized allograft, and inserted under live fluoroscopy.  After placement of the intervertebral device, the above anterior cervical plate was selected, and placed across the interspace. Using a high-speed drill, the cortex of the cervical vertebral bodies was punctured, and screws inserted in the level above and below. Final fluoroscopic images in lateral projection was taken to confirm good hardware placement.  At this point, after all counts were verified to be correct, meticulous hemostasis was secured using a combination of bipolar electrocautery and passive hemostatics. The  platysma muscle was then closed using interrupted 3-0 Vicryl sutures, and the skin was  closed with an interrupted 3-0 Vicry subcuticular stitch. Dermabond and sterile dressings were then applied and the drapes removed.  The patient tolerated the procedure well and was extubated in the room and taken to the postanesthesia care unit in stable condition.

## 2014-06-24 NOTE — Plan of Care (Signed)
Problem: Consults Goal: Spinal Surgery Patient Education See Patient Education Module for education specifics.  Outcome: Completed/Met Date Met:  06/24/14     

## 2014-06-24 NOTE — Anesthesia Preprocedure Evaluation (Signed)
Anesthesia Evaluation  Patient identified by MRN, date of birth, ID band Patient awake    Reviewed: Allergy & Precautions, H&P , NPO status , Patient's Chart, lab work & pertinent test results  Airway Mallampati: II   Neck ROM: full    Dental   Pulmonary shortness of breath, asthma , sleep apnea , former smoker,          Cardiovascular hypertension,     Neuro/Psych Depression Bipolar Disorder    GI/Hepatic GERD-  ,  Endo/Other  diabetes, Type 2obese  Renal/GU      Musculoskeletal   Abdominal   Peds  Hematology   Anesthesia Other Findings   Reproductive/Obstetrics                             Anesthesia Physical Anesthesia Plan  ASA: II  Anesthesia Plan: General   Post-op Pain Management:    Induction: Intravenous  Airway Management Planned: Oral ETT  Additional Equipment:   Intra-op Plan:   Post-operative Plan: Extubation in OR  Informed Consent: I have reviewed the patients History and Physical, chart, labs and discussed the procedure including the risks, benefits and alternatives for the proposed anesthesia with the patient or authorized representative who has indicated his/her understanding and acceptance.     Plan Discussed with: CRNA, Anesthesiologist and Surgeon  Anesthesia Plan Comments:         Anesthesia Quick Evaluation

## 2014-06-24 NOTE — Discharge Summary (Signed)
Physician Discharge Summary  Patient ID: Levi Meyers MRN: [id number] DOB/AGE: 1965-07-30 48 y.o.  Admit date: 06/24/2014 Discharge date: 06/24/2014  Admission Diagnoses: Cervical spondylosis C5-6  Discharge Diagnoses: Same Active Problems:   Cervical spondylosis with radiculopathy   Discharged Condition: Stable  Hospital Course:  [name] is a 48 y.o. male electively admitted after ACDF. Postoperatively, the patient was at neurologic baseline, reporting relief of his right arm pain. He was tolerating diet, ambulating independently, voiding normally, with pain controlled with oral medication.  Treatments: Surgery - ACDF C5-6  Discharge Exam: Blood pressure 121/77, pulse 101, temperature 97 F (36.1 C), temperature source Oral, resp. rate 16, height 5\' 5"  (1.651 m), weight 85.367 kg (188 lb 3.2 oz), SpO2 97 %. Awake, alert, oriented Speech fluent, appropriate CN grossly intact 5/5 BUE/BLE Wound c/d/i  Follow-up: Follow-up in my office [facility] [phone]) in 2-3 weeks  Disposition: 01-Home or Self Care     Medication List    STOP taking these medications        ALPRAZolam 0.5 MG tablet  Commonly known as:  XANAX     doxycycline 100 MG capsule  Commonly known as:  VIBRAMYCIN     HYDROcodone-acetaminophen 5-325 MG per tablet  Commonly known as:  NORCO/VICODIN     naproxen 500 MG tablet  Commonly known as:  NAPROSYN     ondansetron 4 MG disintegrating tablet  Commonly known as:  ZOFRAN ODT      TAKE these medications        albuterol 108 (90 BASE) MCG/ACT inhaler  Commonly known as:  PROVENTIL HFA;VENTOLIN HFA  Inhale 2 puffs into the lungs every 6 (six) hours as needed for wheezing or shortness of breath.     aspirin EC 81 MG tablet  Take 1 tablet (81 mg total) by mouth daily.  Start taking on:  07/04/2014     atorvastatin 20 MG tablet  Commonly known as:  LIPITOR  Take 20 mg by mouth daily.     cyclobenzaprine 10  MG tablet  Commonly known as:  FLEXERIL  Take 1 tablet (10 mg total) by mouth 2 (two) times daily as needed for muscle spasms.     diazepam 5 MG tablet  Commonly known as:  VALIUM  Take 1 tablet (5 mg total) by mouth every 6 (six) hours as needed for muscle spasms.     gabapentin 300 MG capsule  Commonly known as:  NEURONTIN  Take 600 mg by mouth 2 (two) times daily.     ibuprofen 200 MG tablet  Commonly known as:  ADVIL,MOTRIN  Take 400 mg by mouth 2 (two) times daily as needed (pain).     INVOKANA 300 MG Tabs tablet  Generic drug:  canagliflozin  Take 300 mg by mouth daily.     LANTUS SOLOSTAR 100 UNIT/ML Solostar Pen  Generic drug:  Insulin Glargine  Inject 50 Units into the skin 2 (two) times daily.     lisinopril-hydrochlorothiazide 20-12.5 MG per tablet  Commonly known as:  PRINZIDE,ZESTORETIC  Take 1 tablet by mouth daily. *Patient MUST MAKE APPOINTMENT*     NOVOLOG FLEXPEN 100 UNIT/ML FlexPen  Generic drug:  insulin aspart  Inject 20 Units into the skin daily before supper.     oxyCODONE-acetaminophen 5-325 MG per tablet  Commonly known as:  PERCOCET/ROXICET  Take 1-2 tablets by mouth every 4 (four) hours as needed for moderate pain.     silver sulfADIAZINE 1 % cream  Commonly known as:  SILVADENE  Apply 1 application topically daily as needed (wound care).           Follow-up Information    Follow up with Jairo Ben, MD.   Specialty:  Neurosurgery   Contact information:   [address] [place] [address] [phone]       Signed: Consuella Lose, Loletha Grayer 06/24/2014, 3:23 PM

## 2014-06-24 NOTE — Plan of Care (Signed)
Problem: Consults Goal: Diagnosis - Spinal Surgery Outcome: Completed/Met Date Met:  06/24/14 Cervical Spine Fusion     

## 2014-06-24 NOTE — Progress Notes (Signed)
Patient alert and oriented, mae's well, voiding adequate amount of urine, swallowing without difficulty, c/o minimal pain. Patient discharged home with family. Script and discharged instructions given to patient. Patient and family stated understanding of instructions given. Tomma Rakers RN

## 2014-06-24 NOTE — Progress Notes (Signed)
Inpatient Diabetes Program Recommendations  AACE/ADA: New Consensus Statement on Inpatient Glycemic Control (2013)  Target Ranges:  Prepandial:   less than 140 mg/dL      Peak postprandial:   less than 180 mg/dL (1-2 hours)      Critically ill patients:  140 - 180 mg/dL   Results for TERY, HOEGER (MRN [id number]) as of 06/24/2014 12:20  Ref. Range 06/24/2014 06:13 06/24/2014 07:28 06/24/2014 08:31 06/24/2014 10:00 06/24/2014 12:00  Glucose-Capillary Latest Range: 70-99 mg/dL 71 67 (L) 95 116 (H) 105 (H)   Diabetes history: DM2 Outpatient Diabetes medications: Novolog 20 units with supper, Lantus 50 units BID Current orders for Inpatient glycemic control: None  Inpatient Diabetes Program Recommendations Correction (SSI): While inpatient, please consider ordering CBGs with Novolog correction scale Q4H (if NPO) or ACHS.  Thanks, Barnie Alderman, RN, MSN, CCRN, CDE Diabetes Coordinator Inpatient Diabetes Program [phone] (Team Pager) [phone] (AP office) [phone] [facility] office)

## 2014-06-27 ENCOUNTER — Encounter (HOSPITAL_COMMUNITY): Payer: Self-pay | Admitting: Neurosurgery

## 2014-06-28 ENCOUNTER — Emergency Department (HOSPITAL_COMMUNITY)
Admission: EM | Admit: 2014-06-28 | Discharge: 2014-06-28 | Disposition: A | Discharge status: Home / Self Care | Payer: Medicare Other | Attending: Emergency Medicine | Admitting: Emergency Medicine

## 2014-06-28 ENCOUNTER — Emergency Department (HOSPITAL_COMMUNITY): Payer: Medicare Other

## 2014-06-28 ENCOUNTER — Encounter (HOSPITAL_COMMUNITY): Payer: Self-pay | Admitting: Emergency Medicine

## 2014-06-28 DIAGNOSIS — Z79899 Other long term (current) drug therapy: Secondary | ICD-10-CM | POA: Diagnosis not present

## 2014-06-28 DIAGNOSIS — Z794 Long term (current) use of insulin: Secondary | ICD-10-CM | POA: Insufficient documentation

## 2014-06-28 DIAGNOSIS — Z9889 Other specified postprocedural states: Secondary | ICD-10-CM | POA: Diagnosis not present

## 2014-06-28 DIAGNOSIS — E785 Hyperlipidemia, unspecified: Secondary | ICD-10-CM | POA: Diagnosis not present

## 2014-06-28 DIAGNOSIS — Z87442 Personal history of urinary calculi: Secondary | ICD-10-CM | POA: Insufficient documentation

## 2014-06-28 DIAGNOSIS — Z79891 Long term (current) use of opiate analgesic: Secondary | ICD-10-CM | POA: Diagnosis not present

## 2014-06-28 DIAGNOSIS — R002 Palpitations: Secondary | ICD-10-CM

## 2014-06-28 DIAGNOSIS — Z87891 Personal history of nicotine dependence: Secondary | ICD-10-CM | POA: Insufficient documentation

## 2014-06-28 DIAGNOSIS — R000 Tachycardia, unspecified: Secondary | ICD-10-CM | POA: Diagnosis not present

## 2014-06-28 DIAGNOSIS — R110 Nausea: Secondary | ICD-10-CM | POA: Insufficient documentation

## 2014-06-28 DIAGNOSIS — E875 Hyperkalemia: Secondary | ICD-10-CM | POA: Diagnosis not present

## 2014-06-28 DIAGNOSIS — Z7982 Long term (current) use of aspirin: Secondary | ICD-10-CM | POA: Diagnosis not present

## 2014-06-28 DIAGNOSIS — I10 Essential (primary) hypertension: Secondary | ICD-10-CM | POA: Insufficient documentation

## 2014-06-28 DIAGNOSIS — R0902 Hypoxemia: Secondary | ICD-10-CM | POA: Insufficient documentation

## 2014-06-28 DIAGNOSIS — J45909 Unspecified asthma, uncomplicated: Secondary | ICD-10-CM | POA: Insufficient documentation

## 2014-06-28 DIAGNOSIS — Z791 Long term (current) use of non-steroidal anti-inflammatories (NSAID): Secondary | ICD-10-CM | POA: Insufficient documentation

## 2014-06-28 DIAGNOSIS — E119 Type 2 diabetes mellitus without complications: Secondary | ICD-10-CM | POA: Diagnosis not present

## 2014-06-28 LAB — I-STAT TROPONIN, ED: Troponin i, poc: 0 ng/mL (ref 0.00–0.08)

## 2014-06-28 LAB — POTASSIUM: Potassium: 4 mEq/L (ref 3.7–5.3)

## 2014-06-28 LAB — BASIC METABOLIC PANEL
Anion gap: 14 (ref 5–15)
BUN: 18 mg/dL (ref 6–23)
CO2: 24 mEq/L (ref 19–32)
Calcium: 10.3 mg/dL (ref 8.4–10.5)
Chloride: 97 mEq/L (ref 96–112)
Creatinine, Ser: 0.85 mg/dL (ref 0.50–1.35)
GFR calc Af Amer: >90 mL/min (ref >90)
GFR calc non Af Amer: >90 mL/min (ref >90)
Glucose, Bld: 130 mg/dL — ABNORMAL HIGH (ref 70–99)
Potassium: 5.5 mEq/L — ABNORMAL HIGH (ref 3.7–5.3)
Sodium: 135 mEq/L — ABNORMAL LOW (ref 137–147)

## 2014-06-28 LAB — CBC
HCT: 48.8 % (ref 39.0–52.0)
Hemoglobin: 16.8 g/dL (ref 13.0–17.0)
MCH: 30.4 pg (ref 26.0–34.0)
MCHC: 34.4 g/dL (ref 30.0–36.0)
MCV: 88.2 fL (ref 78.0–100.0)
Platelets: 345 10*3/uL (ref 150–400)
RBC: 5.53 MIL/uL (ref 4.22–5.81)
RDW: 13.1 % (ref 11.5–15.5)
WBC: 12 10*3/uL — ABNORMAL HIGH (ref 4.0–10.5)

## 2014-06-28 MED ORDER — ONDANSETRON HCL 4 MG/2ML IJ SOLN
4.0000 mg | Freq: Once | INTRAMUSCULAR | Status: AC
  Administered 2014-06-28: 4 mg via INTRAVENOUS
  Filled 2014-06-28: qty 2

## 2014-06-28 MED ORDER — IOHEXOL 350 MG/ML SOLN
80.0000 mL | Freq: Once | INTRAVENOUS | Status: AC | PRN
  Administered 2014-06-28: 73 mL via INTRAVENOUS

## 2014-06-28 NOTE — ED Notes (Signed)
Patient is resting comfortably. 

## 2014-06-28 NOTE — ED Notes (Signed)
To ct

## 2014-06-28 NOTE — ED Notes (Signed)
Patient was given a [place] sandwich bag, with sprite.

## 2014-06-28 NOTE — ED Notes (Signed)
Family at bedside. 

## 2014-06-28 NOTE — ED Notes (Signed)
Pt drinking diet ginger ale tolerating

## 2014-06-28 NOTE — ED Provider Notes (Signed)
CSN: [id number]     Arrival date & time 06/28/14  1152 History   First MD Initiated Contact with Patient 06/28/14 1207     Chief Complaint  Patient presents with  . Palpitations     (Consider location/radiation/quality/duration/timing/severity/associated sxs/prior Treatment) HPI Pt is a 48yo male with hx of DM, hyperlipidemia, HTN, GERD, depression, asthma, and sleep apnea, presenting to ED with c/o palpitations that started yesterday and have been constant since onset. Pt states he was sitting at home when symptoms started. States it feels like his heart is racing, associated with nausea but denies chest pain or SOB. Pt called his PCP yesterday and was advised to rest to see if palpitations resolved on their own but when they continued today, pt's wife advised him to seek medical attention. Pt takes lisinopril for his HTN but denies taking any medications for his heart rate.  Pt had a C4-C5 fusion on Friday, 06/24/14, was given percocet and valium which are the only new changes in his medications.  Reports drinking diet soda drinks as well as one cup of coffee in the morning but denies recent increase in caffeine consumption.  Per medical records, pt does have hx of tachycardia. Denies hx of CAD, asthma or COPD.  Does report family hx of CAD.    Past Medical History  Diagnosis Date  . Diabetes mellitus   . Hyperlipidemia   . Hypertension   . GERD (gastroesophageal reflux disease)   . History of kidney stones   . Depression   . Asthma   . Kidney disease     stones  . Noncompliance with diabetes treatment   . Sleep apnea     cpap   Past Surgical History  Procedure Laterality Date  . Appendectomy  2004  . Cholecystectomy  2004  . Leg surgery    . Strabismus surgery    . Eye surgery  1970  . Knee cartilage surgery    . Skin graft  1974  . Colonoscopy N/A 09/08/2012    Procedure: COLONOSCOPY;  Surgeon: Leighton Ruff, MD;  Location: [facility];  Service: Endoscopy;  Laterality: N/A;   . Cardiac catheterization      Dr. Verl Blalock ([place])  . Hernia repair    . Anterior cervical decomp/discectomy fusion N/A 06/24/2014    Procedure: Cervical five-six anterior cervical decompression with fusion interbody prosthesis with plating and bonegraft;  Surgeon: Consuella Lose, MD;  Location: [place] [facility];  Service: Neurosurgery;  Laterality: N/A;  Cervical five-six anterior cervical decompression with fusion interbody prosthesis with plating and bonegraft   Family History  Problem Relation Age of Onset  . Diabetes Mother   . Heart attack Mother   . Stroke Mother   . Diabetes Father   . Heart disease Other     Parent  . Heart disease Other     Grandparents   History  Substance Use Topics  . Smoking status: Former Smoker -- 4 years    Types: Cigarettes  . Smokeless tobacco: Current User    Types: Snuff  . Alcohol Use: No    Review of Systems  Constitutional: Negative for fever, chills, appetite change and fatigue.  Respiratory: Negative for cough and shortness of breath.   Cardiovascular: Positive for palpitations. Negative for chest pain and leg swelling.  Gastrointestinal: Positive for nausea. Negative for vomiting, abdominal pain, diarrhea and constipation.  Neurological: Negative for dizziness, light-headedness and headaches.  All other systems reviewed and are negative.     Allergies  Byetta 10 mcg pen; Codeine; and Victoza  Home Medications   Prior to Admission medications   Medication Sig Start Date End Date Taking? Authorizing Provider  aspirin EC 81 MG tablet Take 1 tablet (81 mg total) by mouth daily. 07/04/14  Yes Consuella Lose, MD  atorvastatin (LIPITOR) 20 MG tablet Take 20 mg by mouth daily.   Yes Historical Provider, MD  Canagliflozin (INVOKANA) 300 MG TABS Take 300 mg by mouth daily.   Yes Historical Provider, MD  diazepam (VALIUM) 5 MG tablet Take 1 tablet (5 mg total) by mouth every 6 (six) hours as needed for muscle spasms. 06/24/14  Yes  Consuella Lose, MD  gabapentin (NEURONTIN) 300 MG capsule Take 600 mg by mouth 2 (two) times daily.   Yes Historical Provider, MD  HYDROcodone-acetaminophen (NORCO/VICODIN) 5-325 MG per tablet Take 1-2 tablets by mouth every 6 (six) hours as needed for moderate pain.  05/06/14  Yes Historical Provider, MD  ibuprofen (ADVIL,MOTRIN) 200 MG tablet Take 400 mg by mouth 2 (two) times daily as needed (pain).   Yes Historical Provider, MD  insulin aspart (NOVOLOG FLEXPEN) 100 UNIT/ML FlexPen Inject 20 Units into the skin daily before supper.   Yes Historical Provider, MD  Insulin Glargine (LANTUS SOLOSTAR) 100 UNIT/ML Solostar Pen Inject 50 Units into the skin 2 (two) times daily.   Yes Historical Provider, MD  lisinopril-hydrochlorothiazide (PRINZIDE,ZESTORETIC) 20-12.5 MG per tablet Take 1 tablet by mouth daily. *Patient MUST MAKE APPOINTMENT* 03/07/14  Yes Pixie Casino, MD  [facility] VERIO test strip 1 strip by Does not apply route 3 (three) times daily. 06/13/14  Yes Historical Provider, MD  oxyCODONE-acetaminophen (PERCOCET/ROXICET) 5-325 MG per tablet Take 1-2 tablets by mouth every 4 (four) hours as needed for moderate pain. 06/24/14  Yes Consuella Lose, MD  cyclobenzaprine (FLEXERIL) 10 MG tablet Take 1 tablet (10 mg total) by mouth 2 (two) times daily as needed for muscle spasms. Patient not taking: Reported on 06/28/2014 04/27/14   Dorie Rank, MD   BP 111/75 mmHg  Pulse 100  Temp(Src) 98 F (36.7 C) (Oral)  Resp 13  SpO2 94% Physical Exam  Constitutional: He appears well-developed and well-nourished. No distress.  Pt lying comfortably in exam bed, NAD.   HENT:  Head: Normocephalic and atraumatic.  Eyes: Conjunctivae are normal. No scleral icterus.  Neck: Normal range of motion.  Cardiovascular: Regular rhythm and normal heart sounds.  Tachycardia present.   Pulmonary/Chest: Effort normal and breath sounds normal. No respiratory distress. He has no wheezes. He has no rales. He  exhibits no tenderness.  No respiratory distress, able to speak in full sentences w/o difficulty. Lungs: CTAB  Abdominal: Soft. Bowel sounds are normal. He exhibits no distension and no mass. There is no tenderness. There is no rebound and no guarding.  Musculoskeletal: Normal range of motion.  Neurological: He is alert.  Skin: Skin is warm and dry. He is not diaphoretic.  Nursing note and vitals reviewed.   ED Course  Procedures (including critical care time) Labs Review Labs Reviewed  BASIC METABOLIC PANEL - Abnormal; Notable for the following:    Sodium 135 (*)    Potassium 5.5 (*)    Glucose, Bld 130 (*)    All other components within normal limits  CBC - Abnormal; Notable for the following:    WBC 12.0 (*)    All other components within normal limits  POTASSIUM  I-STAT TROPOININ, ED    Imaging Review Ct Angio Chest Pe W/cm &/or  Wo Cm  06/28/2014   CLINICAL DATA:  Palpitations than being yesterday. Call PCP and was told to wait it out. Recent C4-5 fusion this past Friday.  EXAM: CT ANGIOGRAPHY CHEST WITH CONTRAST  TECHNIQUE: Multidetector CT imaging of the chest was performed using the standard protocol during bolus administration of intravenous contrast. Multiplanar CT image reconstructions and MIPs were obtained to evaluate the vascular anatomy.  CONTRAST:  4mL OMNIPAQUE IOHEXOL 350 MG/ML SOLN  COMPARISON:  09/18/2011  FINDINGS: There is adequate opacification of the pulmonary arteries. There is no pulmonary embolus. The main pulmonary artery, right main pulmonary artery and left main pulmonary arteries are normal in size. The heart size is normal. There is no pericardial effusion. There is pericardial calcification.  The lungs are clear. There is no focal consolidation, pleural effusion or pneumothorax.  There is no axillary, hilar, or mediastinal adenopathy.  There is no lytic or blastic osseous lesion. There is soft tissue air within the right sternocleidomastoid muscle and  subcutaneous fat likely postsurgical given recent ACDF.  The visualized portions of the upper abdomen are unremarkable.  Review of the MIP images confirms the above findings.  IMPRESSION: 1. No evidence of pulmonary embolism. 2. Calcified pericardium as can be seen with chronic pericarditis.   Electronically Signed   By: Kathreen Devoid   On: 06/28/2014 15:38   Dg Chest Port 1 View  06/28/2014   CLINICAL DATA:  PALPITATIONS,SOB.POST-OP NECK SURGERY 3-5 DAYS AGO  EXAM: PORTABLE CHEST - 1 VIEW  COMPARISON:  12/27/2013  FINDINGS: Lower lung volumes. Some increase in patchy interstitial infiltrates or subsegmental atelectasis in the lung bases, left greater than right. Heart size upper limits normal for technique. No pneumothorax. No effusion. Cervical fixation hardware partially visualized.  IMPRESSION: 1. Lower lung volumes with increase in bibasilar atelectasis or infiltrates, left greater than right.   Electronically Signed   By: Arne Cleveland M.D.   On: 06/28/2014 13:01     EKG Interpretation   Date/Time:  Tuesday June 28 2014 12:05:27 EST Ventricular Rate:  109 PR Interval:  135 QRS Duration: 85 QT Interval:  313 QTC Calculation: 421 R Axis:   93 Text Interpretation:  Sinus tachycardia Borderline right axis deviation  Borderline repolarization abnormality Since last tracing rate faster  Confirmed by WENTZ  MD, ELLIOTT [phone]) on 06/28/2014 12:23:26 PM      MDM   Final diagnoses:  Palpitations  Tachycardia    Pt is a 48yo male with hx of tachycardia, s/p C4-C5 fusion on Friday, 12/4, presenting to ED with c/o palpitations described as heart racing.  Denies CP or SOB.  Pt is tachycardic in ED, HR-117 at highest.  Pt also has mild hypoxia of O2 at 91% on RA.  Cardiac workup with CXR, ECG, and troponin- unremarkable.  Due to recent surgery and tachycardia, pt is at moderate risk for a PE.  Discussed pt with Dr. Eulis Foster, will get a CT angio chest for further evaluation of symptoms.   K+  initially 5.5, repeat 4.0.   Initial elevated potassium likely due to hemolysis.   3:26 PM  Vitals improving, HR improved to 98 in ED, O2 on RA staying around 93-94%. CT angio chest still pending.  Pt states he feels better now. Denies palpitations or chest pain. Denies SOB  CT angio chest: negative for PE.  Discussed pt with Dr. Eulis Foster, pt is hemodynamically stable and may be discharged home to f/u with PCP. May need referral to cardiology if palpitations persist.  Return precautions provided. Pt verbalized understanding and agreement with tx plan.    Noland Fordyce, PA-C 06/28/14 [place], MD 06/29/14 1046

## 2014-06-28 NOTE — ED Notes (Signed)
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 Pt reportedly called PCP and was told to wait it out. Recent C4- C5 fusion. EKG WDL for EMS. 324mg  ASA. VSS.

## 2014-08-02 NOTE — H&P (Signed)
CC:  Neck and arm pain  HPI: Levi Meyers is a 49 y.o. male initially seen in the outpatient clinic with neck and right arm pain consistent with a C6 radiculopathy. MRI demonstrated spondylotic change with nerve impingement. Treatment options were discussed, and the patient elected to proceed with surgical decompression.  PMH: Past Medical History  Diagnosis Date  . Diabetes mellitus   . Hyperlipidemia   . Hypertension   . GERD (gastroesophageal reflux disease)   . History of kidney stones   . Depression   . Asthma   . Kidney disease     stones  . Noncompliance with diabetes treatment   . Sleep apnea     cpap    PSH: Past Surgical History  Procedure Laterality Date  . Appendectomy  2004  . Cholecystectomy  2004  . Leg surgery    . Strabismus surgery    . Eye surgery  1970  . Knee cartilage surgery    . Skin graft  1974  . Colonoscopy N/A 09/08/2012    Procedure: COLONOSCOPY;  Surgeon: Leighton Ruff, MD;  Location: [facility];  Service: Endoscopy;  Laterality: N/A;  . Cardiac catheterization      Dr. Verl Blalock ([facility])  . Hernia repair    . Anterior cervical decomp/discectomy fusion N/A 06/24/2014    Procedure: Cervical five-six anterior cervical decompression with fusion interbody prosthesis with plating and bonegraft;  Surgeon: Consuella Lose, MD;  Location: [facility];  Service: Neurosurgery;  Laterality: N/A;  Cervical five-six anterior cervical decompression with fusion interbody prosthesis with plating and bonegraft    SH: History  Substance Use Topics  . Smoking status: Former Smoker -- 4 years    Types: Cigarettes  . Smokeless tobacco: Current User    Types: Snuff  . Alcohol Use: No    MEDS: Prior to Admission medications   Medication Sig Start Date End Date Taking? Authorizing Provider  atorvastatin (LIPITOR) 20 MG tablet Take 20 mg by mouth daily.   Yes Historical Provider, MD  Canagliflozin (INVOKANA) 300 MG TABS Take 300 mg by mouth daily.   Yes  Historical Provider, MD  cyclobenzaprine (FLEXERIL) 10 MG tablet Take 1 tablet (10 mg total) by mouth 2 (two) times daily as needed for muscle spasms. Patient not taking: Reported on 06/28/2014 04/27/14  Yes Dorie Rank, MD  gabapentin (NEURONTIN) 300 MG capsule Take 600 mg by mouth 2 (two) times daily.   Yes Historical Provider, MD  ibuprofen (ADVIL,MOTRIN) 200 MG tablet Take 400 mg by mouth 2 (two) times daily as needed (pain).   Yes Historical Provider, MD  insulin aspart (NOVOLOG FLEXPEN) 100 UNIT/ML FlexPen Inject 20 Units into the skin daily before supper.   Yes Historical Provider, MD  Insulin Glargine (LANTUS SOLOSTAR) 100 UNIT/ML Solostar Pen Inject 50 Units into the skin 2 (two) times daily.   Yes Historical Provider, MD  lisinopril-hydrochlorothiazide (PRINZIDE,ZESTORETIC) 20-12.5 MG per tablet Take 1 tablet by mouth daily. *Patient MUST MAKE APPOINTMENT* 03/07/14  Yes Pixie Casino, MD  aspirin EC 81 MG tablet Take 1 tablet (81 mg total) by mouth daily. 07/04/14   Consuella Lose, MD  diazepam (VALIUM) 5 MG tablet Take 1 tablet (5 mg total) by mouth every 6 (six) hours as needed for muscle spasms. 06/24/14   Consuella Lose, MD  HYDROcodone-acetaminophen (NORCO/VICODIN) 5-325 MG per tablet Take 1-2 tablets by mouth every 6 (six) hours as needed for moderate pain.  05/06/14   Historical Provider, MD  Roma Schanz test  strip 1 strip by Does not apply route 3 (three) times daily. 06/13/14   Historical Provider, MD  oxyCODONE-acetaminophen (PERCOCET/ROXICET) 5-325 MG per tablet Take 1-2 tablets by mouth every 4 (four) hours as needed for moderate pain. 06/24/14   Consuella Lose, MD    ALLERGY: Allergies  Allergen Reactions  . Byetta 10 Mcg Pen [Exenatide] Nausea And Vomiting  . Codeine Nausea And Vomiting  . Victoza [Liraglutide] Nausea And Vomiting    ROS: ROS  NEUROLOGIC EXAM: Awake, alert, oriented Memory and concentration grossly intact Speech fluent, appropriate CN  grossly intact Motor exam: Upper Extremities Deltoid Bicep Tricep Grip  Right 5/5 5/5 5/5 5/5  Left 5/5 5/5 5/5 5/5   Lower Extremity IP Quad PF DF EHL  Right 5/5 5/5 5/5 5/5 5/5  Left 5/5 5/5 5/5 5/5 5/5   Sensation grossly intact to LT  IMGAING: MRI of the cervical spine was reviewed which shows normal cervical alignment.  At C5 C6 there is broad-based disc bulge with bilateral bony osteophytes causing moderately severe right-sided foraminal stenosis, and moderate left-sided foraminal stenosis.   IMPRESSION: - 49 y.o. male with C6 radiculopathy and C5-6 foraminal stenosis  PLAN: - Proceed with surgical decompression via ACDF C5-6  MRI findings were reviewed in detail with the patient in the office.  Treatment options were reviewed. The risks of surgery were discussed in detail with the patient which include but are not limited to spinal cord injury which may result in hand, leg, and bowel dysfunction, postoperative dysphagia, dysphonia, neck hematoma, or subsequent surgery for epidural hematoma.  The risk of CSF leak was also discussed. In addition, I explained to him that after spinal fusion surgery, there is a risk of adjacent level disease requiring future surgical intervention. The patient understood our discussion as well as the risks of the surgery and is willing to proceed.  All questions were answered.

## 2014-08-04 DIAGNOSIS — E1165 Type 2 diabetes mellitus with hyperglycemia: Secondary | ICD-10-CM | POA: Diagnosis not present

## 2014-08-04 DIAGNOSIS — E1140 Type 2 diabetes mellitus with diabetic neuropathy, unspecified: Secondary | ICD-10-CM | POA: Diagnosis not present

## 2014-11-08 DIAGNOSIS — Z87891 Personal history of nicotine dependence: Secondary | ICD-10-CM | POA: Diagnosis not present

## 2014-11-08 DIAGNOSIS — E119 Type 2 diabetes mellitus without complications: Secondary | ICD-10-CM | POA: Diagnosis not present

## 2014-11-08 DIAGNOSIS — I10 Essential (primary) hypertension: Secondary | ICD-10-CM | POA: Diagnosis not present

## 2014-11-08 DIAGNOSIS — R55 Syncope and collapse: Secondary | ICD-10-CM | POA: Diagnosis not present

## 2014-11-08 DIAGNOSIS — Z794 Long term (current) use of insulin: Secondary | ICD-10-CM | POA: Diagnosis not present

## 2014-11-08 DIAGNOSIS — E1165 Type 2 diabetes mellitus with hyperglycemia: Secondary | ICD-10-CM | POA: Diagnosis not present

## 2014-11-08 DIAGNOSIS — Z79899 Other long term (current) drug therapy: Secondary | ICD-10-CM | POA: Diagnosis not present

## 2014-11-08 DIAGNOSIS — R51 Headache: Secondary | ICD-10-CM | POA: Diagnosis not present

## 2014-11-08 DIAGNOSIS — Z791 Long term (current) use of non-steroidal anti-inflammatories (NSAID): Secondary | ICD-10-CM | POA: Diagnosis not present

## 2014-11-08 DIAGNOSIS — E785 Hyperlipidemia, unspecified: Secondary | ICD-10-CM | POA: Diagnosis not present

## 2014-11-08 DIAGNOSIS — E1169 Type 2 diabetes mellitus with other specified complication: Secondary | ICD-10-CM | POA: Diagnosis not present

## 2014-11-08 DIAGNOSIS — Z886 Allergy status to analgesic agent status: Secondary | ICD-10-CM | POA: Diagnosis not present

## 2014-11-08 DIAGNOSIS — Z888 Allergy status to other drugs, medicaments and biological substances status: Secondary | ICD-10-CM | POA: Diagnosis not present

## 2014-11-08 DIAGNOSIS — E1140 Type 2 diabetes mellitus with diabetic neuropathy, unspecified: Secondary | ICD-10-CM | POA: Diagnosis not present

## 2014-11-08 DIAGNOSIS — R531 Weakness: Secondary | ICD-10-CM | POA: Diagnosis not present

## 2014-11-08 DIAGNOSIS — Z7982 Long term (current) use of aspirin: Secondary | ICD-10-CM | POA: Diagnosis not present

## 2015-01-05 DIAGNOSIS — E1140 Type 2 diabetes mellitus with diabetic neuropathy, unspecified: Secondary | ICD-10-CM | POA: Diagnosis not present

## 2015-01-05 DIAGNOSIS — E1165 Type 2 diabetes mellitus with hyperglycemia: Secondary | ICD-10-CM | POA: Diagnosis not present

## 2015-01-05 DIAGNOSIS — E785 Hyperlipidemia, unspecified: Secondary | ICD-10-CM | POA: Diagnosis not present

## 2015-01-05 DIAGNOSIS — E1169 Type 2 diabetes mellitus with other specified complication: Secondary | ICD-10-CM | POA: Diagnosis not present

## 2015-03-03 ENCOUNTER — Encounter (HOSPITAL_BASED_OUTPATIENT_CLINIC_OR_DEPARTMENT_OTHER): Payer: Self-pay | Admitting: Emergency Medicine

## 2015-03-03 ENCOUNTER — Emergency Department (HOSPITAL_BASED_OUTPATIENT_CLINIC_OR_DEPARTMENT_OTHER): Payer: Medicare Other

## 2015-03-03 ENCOUNTER — Emergency Department (HOSPITAL_BASED_OUTPATIENT_CLINIC_OR_DEPARTMENT_OTHER)
Admission: EM | Admit: 2015-03-03 | Discharge: 2015-03-03 | Disposition: A | Discharge status: Home / Self Care | Payer: Medicare Other | Attending: Emergency Medicine | Admitting: Emergency Medicine

## 2015-03-03 DIAGNOSIS — Z794 Long term (current) use of insulin: Secondary | ICD-10-CM | POA: Insufficient documentation

## 2015-03-03 DIAGNOSIS — Z87891 Personal history of nicotine dependence: Secondary | ICD-10-CM | POA: Insufficient documentation

## 2015-03-03 DIAGNOSIS — J45909 Unspecified asthma, uncomplicated: Secondary | ICD-10-CM | POA: Diagnosis not present

## 2015-03-03 DIAGNOSIS — R109 Unspecified abdominal pain: Secondary | ICD-10-CM | POA: Diagnosis present

## 2015-03-03 DIAGNOSIS — Z9981 Dependence on supplemental oxygen: Secondary | ICD-10-CM | POA: Diagnosis not present

## 2015-03-03 DIAGNOSIS — E119 Type 2 diabetes mellitus without complications: Secondary | ICD-10-CM | POA: Diagnosis not present

## 2015-03-03 DIAGNOSIS — Z79899 Other long term (current) drug therapy: Secondary | ICD-10-CM | POA: Insufficient documentation

## 2015-03-03 DIAGNOSIS — Z8719 Personal history of other diseases of the digestive system: Secondary | ICD-10-CM | POA: Diagnosis not present

## 2015-03-03 DIAGNOSIS — Z7982 Long term (current) use of aspirin: Secondary | ICD-10-CM | POA: Insufficient documentation

## 2015-03-03 DIAGNOSIS — Z9119 Patient's noncompliance with other medical treatment and regimen: Secondary | ICD-10-CM | POA: Insufficient documentation

## 2015-03-03 DIAGNOSIS — G4730 Sleep apnea, unspecified: Secondary | ICD-10-CM | POA: Diagnosis not present

## 2015-03-03 DIAGNOSIS — Z8659 Personal history of other mental and behavioral disorders: Secondary | ICD-10-CM | POA: Diagnosis not present

## 2015-03-03 DIAGNOSIS — N23 Unspecified renal colic: Secondary | ICD-10-CM | POA: Diagnosis not present

## 2015-03-03 DIAGNOSIS — R1031 Right lower quadrant pain: Secondary | ICD-10-CM | POA: Diagnosis not present

## 2015-03-03 DIAGNOSIS — E785 Hyperlipidemia, unspecified: Secondary | ICD-10-CM | POA: Insufficient documentation

## 2015-03-03 DIAGNOSIS — I10 Essential (primary) hypertension: Secondary | ICD-10-CM | POA: Insufficient documentation

## 2015-03-03 DIAGNOSIS — N281 Cyst of kidney, acquired: Secondary | ICD-10-CM | POA: Diagnosis not present

## 2015-03-03 LAB — BASIC METABOLIC PANEL
ANION GAP: 9 (ref 5–15)
BUN: 14 mg/dL (ref 6–20)
CALCIUM: 9.5 mg/dL (ref 8.9–10.3)
CO2: 24 mmol/L (ref 22–32)
Chloride: 107 mmol/L (ref 101–111)
Creatinine, Ser: 0.89 mg/dL (ref 0.61–1.24)
GFR calc Af Amer: >60 mL/min (ref >60)
GLUCOSE: 114 mg/dL — AB (ref 65–99)
Potassium: 3.3 mmol/L — ABNORMAL LOW (ref 3.5–5.1)
SODIUM: 140 mmol/L (ref 135–145)

## 2015-03-03 LAB — CBG MONITORING, ED
Glucose-Capillary: 118 mg/dL — ABNORMAL HIGH (ref 65–99)
Glucose-Capillary: 90 mg/dL (ref 65–99)

## 2015-03-03 LAB — CBC WITH DIFFERENTIAL/PLATELET
Basophils Absolute: 0 10*3/uL (ref 0.0–0.1)
Basophils Relative: 0 % (ref 0–1)
Eosinophils Absolute: 0.1 10*3/uL (ref 0.0–0.7)
Eosinophils Relative: 1 % (ref 0–5)
HCT: 46.8 % (ref 39.0–52.0)
HEMOGLOBIN: 15.8 g/dL (ref 13.0–17.0)
LYMPHS ABS: 2.9 10*3/uL (ref 0.7–4.0)
LYMPHS PCT: 25 % (ref 12–46)
MCH: 29.5 pg (ref 26.0–34.0)
MCHC: 33.8 g/dL (ref 30.0–36.0)
MCV: 87.5 fL (ref 78.0–100.0)
MONOS PCT: 8 % (ref 3–12)
Monocytes Absolute: 1 10*3/uL (ref 0.1–1.0)
NEUTROS ABS: 7.5 10*3/uL (ref 1.7–7.7)
Neutrophils Relative %: 66 % (ref 43–77)
Platelets: 311 10*3/uL (ref 150–400)
RBC: 5.35 MIL/uL (ref 4.22–5.81)
RDW: 13.2 % (ref 11.5–15.5)
WBC: 11.4 10*3/uL — AB (ref 4.0–10.5)

## 2015-03-03 LAB — URINE MICROSCOPIC-ADD ON

## 2015-03-03 LAB — URINALYSIS, ROUTINE W REFLEX MICROSCOPIC
Bilirubin Urine: NEGATIVE
GLUCOSE, UA: 500 mg/dL — AB
KETONES UR: 15 mg/dL — AB
LEUKOCYTES UA: NEGATIVE
Nitrite: NEGATIVE
PROTEIN: NEGATIVE mg/dL
Specific Gravity, Urine: 1.028 (ref 1.005–1.030)
UROBILINOGEN UA: 0.2 mg/dL (ref 0.0–1.0)
pH: 5.5 (ref 5.0–8.0)

## 2015-03-03 MED ORDER — KETOROLAC TROMETHAMINE 30 MG/ML IJ SOLN
30.0000 mg | Freq: Once | INTRAMUSCULAR | Status: AC
  Administered 2015-03-03: 30 mg via INTRAVENOUS
  Filled 2015-03-03: qty 1

## 2015-03-03 MED ORDER — ONDANSETRON HCL 4 MG/2ML IJ SOLN
4.0000 mg | Freq: Once | INTRAMUSCULAR | Status: DC | PRN
  Administered 2015-03-03: 4 mg via INTRAVENOUS
  Filled 2015-03-03: qty 2

## 2015-03-03 MED ORDER — ONDANSETRON HCL 4 MG PO TABS: 4.0000 mg | ORAL_TABLET | Freq: Four times a day (QID) | ORAL | Status: DC

## 2015-03-03 MED ORDER — OXYCODONE-ACETAMINOPHEN 5-325 MG PO TABS: 1.0000 | ORAL_TABLET | ORAL | Status: DC | PRN

## 2015-03-03 NOTE — ED Provider Notes (Signed)
CSN: [id number]     Arrival date & time 03/03/15  1125 History   First MD Initiated Contact with Patient 03/03/15 1156     Chief Complaint  Patient presents with  . Flank Pain     (Consider location/radiation/quality/duration/timing/severity/associated sxs/prior Treatment) HPI Levi SHELNUTT 49 year old male with past medical history of diabetes, frequent kidney stones who presents the emergency department with chief complaint of right flank pain. Patient states that he recently passed 2 stones on the right thigh along the radiologist; the right. 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 He began to notice pain in his right flank. At that time and date "I think I jarred one of my stones loose." He complains of achy and has times sharp right flank pain. It is not worsened with movement. He denies any current urinary symptoms, fever, chills. The pain does not radiate. Currently his pain is 5 out of 10.  The patient has associated nausea without vomiting. He states this feels exactly like his previous kidney stones. Past Medical History  Diagnosis Date  . Diabetes mellitus   . Hyperlipidemia   . Hypertension   . GERD (gastroesophageal reflux disease)   . History of kidney stones   . Depression   . Asthma   . Kidney disease     stones  . Noncompliance with diabetes treatment   . Sleep apnea     cpap   Past Surgical History  Procedure Laterality Date  . Appendectomy  2004  . Cholecystectomy  2004  . Leg surgery    . Strabismus surgery    . Eye surgery  1970  . Knee cartilage surgery    . Skin graft  1974  . Colonoscopy N/A 09/08/2012    Procedure: COLONOSCOPY;  Surgeon: Leighton Ruff, MD;  Location: [facility];  Service: Endoscopy;  Laterality: N/A;  . Cardiac catheterization      Dr. Verl Blalock ([facility])  . Hernia repair    . Anterior cervical decomp/discectomy fusion N/A 06/24/2014    Procedure: Cervical five-six anterior cervical decompression  with fusion interbody prosthesis with plating and bonegraft;  Surgeon: Consuella Lose, MD;  Location: [place] [facility];  Service: Neurosurgery;  Laterality: N/A;  Cervical five-six anterior cervical decompression with fusion interbody prosthesis with plating and bonegraft   Family History  Problem Relation Age of Onset  . Diabetes Mother   . Heart attack Mother   . Stroke Mother   . Diabetes Father   . Heart disease Other     Parent  . Heart disease Other     Grandparents   Social History  Substance Use Topics  . Smoking status: Former Smoker -- 4 years    Types: Cigarettes  . Smokeless tobacco: Current User    Types: Snuff  . Alcohol Use: No    Review of Systems  Ten systems reviewed and are negative for acute change, except as noted in the HPI.    Allergies  Byetta 10 mcg pen; Codeine; and Victoza  Home Medications   Prior to Admission medications   Medication Sig Start Date End Date Taking? Authorizing Provider  aspirin EC 81 MG tablet Take 1 tablet (81 mg total) by mouth daily. 07/04/14   Consuella Lose, MD  atorvastatin (LIPITOR) 20 MG tablet Take 20 mg by mouth daily.    Historical Provider, MD  Canagliflozin (INVOKANA) 300 MG TABS Take 300 mg by mouth daily.    Historical Provider, MD  cyclobenzaprine (FLEXERIL) 10 MG tablet Take 1 tablet (10 mg total) by mouth 2 (two) times daily as needed for muscle spasms. Patient not taking: Reported on 06/28/2014 04/27/14   Dorie Rank, MD  diazepam (VALIUM) 5 MG tablet Take 1 tablet (5 mg total) by mouth every 6 (six) hours as needed for muscle spasms. 06/24/14   Consuella Lose, MD  gabapentin (NEURONTIN) 300 MG capsule Take 600 mg by mouth 2 (two) times daily.    Historical Provider, MD  HYDROcodone-acetaminophen (NORCO/VICODIN) 5-325 MG per tablet Take 1-2 tablets by mouth every 6 (six) hours as needed for moderate pain.  05/06/14   Historical Provider, MD  ibuprofen (ADVIL,MOTRIN) 200 MG tablet Take 400 mg by mouth 2 (two)  times daily as needed (pain).    Historical Provider, MD  insulin aspart (NOVOLOG FLEXPEN) 100 UNIT/ML FlexPen Inject 20 Units into the skin daily before supper.    Historical Provider, MD  Insulin Glargine (LANTUS SOLOSTAR) 100 UNIT/ML Solostar Pen Inject 50 Units into the skin 2 (two) times daily.    Historical Provider, MD  lisinopril-hydrochlorothiazide (PRINZIDE,ZESTORETIC) 20-12.5 MG per tablet Take 1 tablet by mouth daily. *Patient MUST MAKE APPOINTMENT* 03/07/14   Pixie Casino, MD  [facility] VERIO test strip 1 strip by Does not apply route 3 (three) times daily. 06/13/14   Historical Provider, MD  oxyCODONE-acetaminophen (PERCOCET/ROXICET) 5-325 MG per tablet Take 1-2 tablets by mouth every 4 (four) hours as needed for moderate pain. 06/24/14   Consuella Lose, MD   BP 125/86 mmHg  Pulse 94  Temp(Src) 98.5 F (36.9 C) (Oral)  Resp 16  Ht 5' 5.5" (1.664 m)  Wt 190 lb (86.183 kg)  BMI 31.13 kg/m2  SpO2 94% Physical Exam Physical Exam  Nursing note and vitals reviewed. Constitutional: He appears well-developed and well-nourished. No distress.  HENT:  Head: Normocephalic and atraumatic.  Eyes: Conjunctivae normal are normal. No scleral icterus.  Neck: Normal range of motion. Neck supple.  Cardiovascular: Normal rate, regular rhythm and normal heart sounds.   Pulmonary/Chest: Effort normal and breath sounds normal. No respiratory distress.  Abdominal: Soft. positive for CVA tenderness, right side  Musculoskeletal: He exhibits no edema.  Neurological: He is alert.  Skin: Skin is warm and dry. He is not diaphoretic.  Psychiatric: His behavior is normal.    ED Course  Procedures (including critical care time) Labs Review Labs Reviewed  CBC WITH DIFFERENTIAL/PLATELET - Abnormal; Notable for the following:    WBC 11.4 (*)    All other components within normal limits  BASIC METABOLIC PANEL - Abnormal; Notable for the following:    Potassium 3.3 (*)    Glucose, Bld 114 (*)     All other components within normal limits  URINALYSIS, ROUTINE W REFLEX MICROSCOPIC (NOT AT [facility]) - Abnormal; Notable for the following:    Glucose, UA 500 (*)    Hgb urine dipstick LARGE (*)    Ketones, ur 15 (*)    All other components within normal limits  CBG MONITORING, ED - Abnormal; Notable for the following:    Glucose-Capillary 118 (*)    All other components within normal limits  URINE MICROSCOPIC-ADD ON    Imaging Review No results found. Ulla Potash, personally reviewed and evaluated these images and lab results as part of my medical decision-making.   EKG Interpretation None      MDM   Final diagnoses:  None    1:02 PM BP 125/86 mmHg  Pulse 94  Temp(Src)  98.5 F (36.9 C) (Oral)  Resp 16  Ht 5' 5.5" (1.664 m)  Wt 190 lb (86.183 kg)  BMI 31.13 kg/m2  SpO2 94% Patient currently without severe pain. He is hemodynamically stable and afebrile. His urine shows large amount of hemoglobin and without bacter for white blood cells. CBC shows a slight leukocytosis. CBG of 118. His potassium is slightly low. Currently awaiting a renal ultrasound, portal, and Zofran ordered.    1:54 PM Paient with hematturia, R flank pain. Hx of kidney stones. Renal ultrasound shows no evidence of hydronephrosis. The patient can be discharged with pain medication, antiemetics, Flomax and push fluids at home. He appears safe for discharge at this time. Patient's blood glucose level was falling as he hadn't eaten since he took his medications earlier. Patient was fed peanut butter crackers and juice here in the emergency department.  Margarita Mail, PA-C 03/03/15 1411  Serita Grit, MD 03/04/15 [phone]

## 2015-03-03 NOTE — Discharge Instructions (Signed)
Your ultrasound showed  no evidence of obstructing stone. There is a gentle blood in your urine. No evidence of infection in your urine injured daily function appears to be normal. He will be discharged with pain medicine, antinausea medications. Follow up with her primary care physician or a urologist.   Kidney Stones Kidney stones (urolithiasis) are deposits that form inside your kidneys. The intense pain is caused by the stone moving through the urinary tract. When the stone moves, the ureter goes into spasm around the stone. The stone is usually passed in the urine.  CAUSES   A disorder that makes certain neck glands produce too much parathyroid hormone (primary hyperparathyroidism).  A buildup of uric acid crystals, similar to gout in your joints.  Narrowing (stricture) of the ureter.  A kidney obstruction present at birth (congenital obstruction).  Previous surgery on the kidney or ureters.  Numerous kidney infections. SYMPTOMS   Feeling sick to your stomach (nauseous).  Throwing up (vomiting).  Blood in the urine (hematuria).  Pain that usually spreads (radiates) to the groin.  Frequency or urgency of urination. DIAGNOSIS   Taking a history and physical exam.  Blood or urine tests.  CT scan.  Occasionally, an examination of the inside of the urinary bladder (cystoscopy) is performed. TREATMENT   Observation.  Increasing your fluid intake.  Extracorporeal shock wave lithotripsy--This is a noninvasive procedure that uses shock waves to break up kidney stones.  Surgery may be needed if you have severe pain or persistent obstruction. There are various surgical procedures. Most of the procedures are performed with the use of small instruments. Only small incisions are needed to accommodate these instruments, so recovery time is minimized. The size, location, and chemical composition are all important variables that will determine the proper choice of action for you.  Talk to your health care provider to better understand your situation so that you will minimize the risk of injury to yourself and your kidney.  HOME CARE INSTRUCTIONS   Drink enough water and fluids to keep your urine clear or pale yellow. This will help you to pass the stone or stone fragments.  Strain all urine through the provided strainer. Keep all particulate matter and stones for your health care provider to see. The stone causing the pain may be as small as a grain of salt. It is very important to use the strainer each and every time you pass your urine. The collection of your stone will allow your health care provider to analyze it and verify that a stone has actually passed. The stone analysis will often identify what you can do to reduce the incidence of recurrences.  Only take over-the-counter or prescription medicines for pain, discomfort, or fever as directed by your health care provider.  Make a follow-up appointment with your health care provider as directed.  Get follow-up X-rays if required. The absence of pain does not always mean that the stone has passed. It may have only stopped moving. If the urine remains completely obstructed, it can cause loss of kidney function or even complete destruction of the kidney. It is your responsibility to make sure X-rays and follow-ups are completed. Ultrasounds of the kidney can show blockages and the status of the kidney. Ultrasounds are not associated with any radiation and can be performed easily in a matter of minutes. SEEK MEDICAL CARE IF:  You experience pain that is progressive and unresponsive to any pain medicine you have been prescribed. SEEK IMMEDIATE MEDICAL CARE IF:  Pain cannot be controlled with the prescribed medicine.  You have a fever or shaking chills.  The severity or intensity of pain increases over 18 hours and is not relieved by pain medicine.  You develop a new onset of abdominal pain.  You feel faint or pass  out.  You are unable to urinate. MAKE SURE YOU:   Understand these instructions.  Will watch your condition.  Will get help right away if you are not doing well or get worse. Document Released: 07/08/2005 Document Revised: 03/10/2013 Document Reviewed: 12/09/2012 [facility] Patient Information 2015 [place], [place]. This information is not intended to replace advice given to you by your health care provider. Make sure you discuss any questions you have with your health care provider.

## 2015-03-03 NOTE — ED Notes (Signed)
cbg 118 

## 2015-03-03 NOTE — ED Notes (Signed)
Rt flank pain

## 2015-03-07 DIAGNOSIS — H524 Presbyopia: Secondary | ICD-10-CM | POA: Diagnosis not present

## 2015-03-07 DIAGNOSIS — E109 Type 1 diabetes mellitus without complications: Secondary | ICD-10-CM | POA: Diagnosis not present

## 2015-05-03 DIAGNOSIS — Z23 Encounter for immunization: Secondary | ICD-10-CM | POA: Diagnosis not present

## 2015-05-03 DIAGNOSIS — Z6833 Body mass index (BMI) 33.0-33.9, adult: Secondary | ICD-10-CM | POA: Diagnosis not present

## 2015-05-03 DIAGNOSIS — Z1389 Encounter for screening for other disorder: Secondary | ICD-10-CM | POA: Diagnosis not present

## 2015-05-03 DIAGNOSIS — Z0001 Encounter for general adult medical examination with abnormal findings: Secondary | ICD-10-CM | POA: Diagnosis not present

## 2015-05-03 DIAGNOSIS — E782 Mixed hyperlipidemia: Secondary | ICD-10-CM | POA: Diagnosis not present

## 2015-06-07 DIAGNOSIS — E1140 Type 2 diabetes mellitus with diabetic neuropathy, unspecified: Secondary | ICD-10-CM | POA: Diagnosis not present

## 2015-06-07 DIAGNOSIS — E1165 Type 2 diabetes mellitus with hyperglycemia: Secondary | ICD-10-CM | POA: Diagnosis not present

## 2015-09-01 ENCOUNTER — Encounter (HOSPITAL_BASED_OUTPATIENT_CLINIC_OR_DEPARTMENT_OTHER): Payer: Self-pay | Admitting: *Deleted

## 2015-09-01 ENCOUNTER — Emergency Department (HOSPITAL_BASED_OUTPATIENT_CLINIC_OR_DEPARTMENT_OTHER)
Admission: EM | Admit: 2015-09-01 | Discharge: 2015-09-01 | Disposition: A | Discharge status: Home / Self Care | Payer: Self-pay | Attending: Physician Assistant | Admitting: Physician Assistant

## 2015-09-01 ENCOUNTER — Emergency Department (HOSPITAL_BASED_OUTPATIENT_CLINIC_OR_DEPARTMENT_OTHER): Payer: Self-pay

## 2015-09-01 DIAGNOSIS — I10 Essential (primary) hypertension: Secondary | ICD-10-CM | POA: Insufficient documentation

## 2015-09-01 DIAGNOSIS — Z8701 Personal history of pneumonia (recurrent): Secondary | ICD-10-CM | POA: Insufficient documentation

## 2015-09-01 DIAGNOSIS — E7800 Pure hypercholesterolemia, unspecified: Secondary | ICD-10-CM | POA: Insufficient documentation

## 2015-09-01 DIAGNOSIS — Z87891 Personal history of nicotine dependence: Secondary | ICD-10-CM | POA: Insufficient documentation

## 2015-09-01 DIAGNOSIS — J069 Acute upper respiratory infection, unspecified: Secondary | ICD-10-CM | POA: Insufficient documentation

## 2015-09-01 DIAGNOSIS — Z7982 Long term (current) use of aspirin: Secondary | ICD-10-CM | POA: Insufficient documentation

## 2015-09-01 DIAGNOSIS — B9789 Other viral agents as the cause of diseases classified elsewhere: Secondary | ICD-10-CM

## 2015-09-01 DIAGNOSIS — Z87442 Personal history of urinary calculi: Secondary | ICD-10-CM | POA: Insufficient documentation

## 2015-09-01 DIAGNOSIS — E119 Type 2 diabetes mellitus without complications: Secondary | ICD-10-CM | POA: Insufficient documentation

## 2015-09-01 DIAGNOSIS — Z794 Long term (current) use of insulin: Secondary | ICD-10-CM | POA: Insufficient documentation

## 2015-09-01 HISTORY — DX: Calculus of kidney: N20.0

## 2015-09-01 HISTORY — DX: Pure hypercholesterolemia, unspecified: E78.00

## 2015-09-01 HISTORY — DX: Type 2 diabetes mellitus without complications: E11.9

## 2015-09-01 MED ORDER — BENZONATATE 100 MG PO CAPS: 100.0000 mg | ORAL_CAPSULE | Freq: Three times a day (TID) | ORAL | Status: DC

## 2015-09-01 MED ORDER — MUCINEX DM 30-600 MG PO TB12: 1.0000 | ORAL_TABLET | Freq: Two times a day (BID) | ORAL | Status: DC

## 2015-09-01 MED FILL — MUCINEX DM ER 600-30 MG TAB: 30-600 | 10 days supply | Qty: 20 | Fill #0

## 2015-09-01 MED FILL — BENZONATATE 100 MG CAPSULE: 100 | 7 days supply | Qty: 21 | Fill #0

## 2015-09-01 NOTE — ED Notes (Signed)
Pt reports cough productive for yellow sputum, congestion, and sinus pain x 1 day

## 2015-09-01 NOTE — Discharge Instructions (Signed)
Your chest x-ray today was normal-- no evidence of pneumonia.  Your illness is most likely viral which may last for several more days or up to 2 weeks. Take the prescribed medication as directed, these should help with your symptoms. Follow-up with your primary care physician. Return to the ED for new or worsening symptoms.

## 2015-09-01 NOTE — ED Provider Notes (Signed)
CSN: [id number]     Arrival date & time 09/01/15  [id number] History   First MD Initiated Contact with Patient 09/01/15 1000     Chief Complaint  Patient presents with  . Nasal Congestion  . Cough     (Consider location/radiation/quality/duration/timing/severity/associated sxs/prior Treatment) Patient is a 50 y.o. male presenting with cough. The history is provided by the patient and medical records.  Cough Associated symptoms: rhinorrhea     50 year old male with history of hypertension, diabetes, hyperlipidemia, kidney stones, presenting to the ED for cough and nasal congestion. Patient states this began yesterday. He states he has had clear rhinorrhea, nasal congestion, sinus pressure, and productive cough with "lime green sputum".  He denies any chest pain or shortness of breath. No sick contacts. Patient denies any underlying pulmonary issues such as asthma or COPD. He states he has had pneumonia in the past. No intervention tried prior to arrival. He did call his PCP but they could not see him for the next several days so he decided to come here.  Vital signs stable.  Past Medical History  Diagnosis Date  . Hypertension   . Diabetes mellitus without complication ([place])   . High cholesterol   . Kidney stone    Past Surgical History  Procedure Laterality Date  . Eye surgery    . Skin graft full thickness leg    . Appendectomy    . Cholecystectomy    . Cervical spine surgery     No family history on file. Social History  Substance Use Topics  . Smoking status: Former Research scientist (life sciences)  . Smokeless tobacco: Current User    Types: Snuff  . Alcohol Use: No    Review of Systems  HENT: Positive for congestion, rhinorrhea and sinus pressure.   Respiratory: Positive for cough.   All other systems reviewed and are negative.     Allergies  Codeine and Invokana  Home Medications   Prior to Admission medications   Medication Sig Start Date End Date Taking? Authorizing Provider  aspirin  81 MG tablet Take 81 mg by mouth daily.   Yes Historical Provider, MD  ATORVASTATIN CALCIUM PO Take by mouth.   Yes Historical Provider, MD  BuPROPion HCl (WELLBUTRIN PO) Take by mouth.   Yes Historical Provider, MD  GABAPENTIN PO Take by mouth.   Yes Historical Provider, MD  insulin glargine (LANTUS) 100 UNIT/ML injection Inject 60 Units into the skin 2 (two) times daily.   Yes Historical Provider, MD  insulin lispro (HUMALOG) 100 UNIT/ML injection Inject 20 Units into the skin daily.   Yes Historical Provider, MD  LISINOPRIL PO Take by mouth.   Yes Historical Provider, MD   BP 122/82 mmHg  Pulse 82  Temp(Src) 97.7 F (36.5 C) (Oral)  Resp 18  Ht 5\' 5"  (1.651 m)  Wt 92.534 kg  BMI 33.95 kg/m2  SpO2 96%   Physical Exam  Constitutional: He is oriented to person, place, and time. He appears well-developed and well-nourished. No distress.  HENT:  Head: Normocephalic and atraumatic.  Right Ear: Tympanic membrane and ear canal normal.  Left Ear: Tympanic membrane and ear canal normal.  Nose: Mucosal edema and rhinorrhea (clear) present. Right sinus exhibits maxillary sinus tenderness and frontal sinus tenderness. Left sinus exhibits maxillary sinus tenderness and frontal sinus tenderness.  Mouth/Throat: Uvula is midline, oropharynx is clear and moist and mucous membranes are normal. No oropharyngeal exudate, posterior oropharyngeal edema, posterior oropharyngeal erythema or tonsillar abscesses.  + nasal congestion,  rhinorrhea, post-nasal drip, frontal and maxillary sinus pressure  Eyes: Conjunctivae and EOM are normal. Pupils are equal, round, and reactive to light.  Neck: Normal range of motion. Neck supple.  Cardiovascular: Normal rate, regular rhythm and normal heart sounds.   Pulmonary/Chest: Effort normal. No respiratory distress. He has wheezes. He has no rhonchi. He has no rales.  Slight wheeze noted in right mid-lung; no distress; speaking in full sentences without difficulty   Abdominal: Soft. Bowel sounds are normal. There is no tenderness. There is no guarding.  Musculoskeletal: Normal range of motion.  Neurological: He is alert and oriented to person, place, and time.  Skin: Skin is warm and dry. He is not diaphoretic.  Psychiatric: He has a normal mood and affect.  Nursing note and vitals reviewed.   ED Course  Procedures (including critical care time) Labs Review Labs Reviewed - No data to display  Imaging Review Dg Chest 2 View  09/01/2015  CLINICAL DATA:  Cough, fever and congestion for 1 day. Initial encounter. EXAM: CHEST  2 VIEW COMPARISON:  None. FINDINGS: Lung volumes are somewhat low but the lungs are clear. Heart size is enlarged. No pneumothorax or pleural effusion. The patient is status post cholecystectomy and cervical fusion. IMPRESSION: No acute disease.  Mild cardiomegaly. Electronically Signed   By: Inge Rise M.D.   On: 09/01/2015 10:34   I have personally reviewed and evaluated these images and lab results as part of my medical decision-making.   EKG Interpretation None      MDM   Final diagnoses:  Viral URI with cough   50 year old male here with nasal congestion, cough, and sinus pressure for the past 2 days. Patient is afebrile, nontoxic. He does have clear rhinorrhea, nasal congestion, sinus pressure, and postnasal drip on exam. Has a slight wheeze in the right midlung, no acute respiratory distress. Vital signs are stable on room air. Chest x-ray was obtained which is negative for acute findings.  Symptoms have only present for 2 days, no underlying COPD or other pulmonary issues, do not feel antibiotics indicated at this time.  Symptoms are most likely viral in nature.  Patient discharged home with supportive care including tessalon and mucinex. Encouraged follow-up with PCP.  Discussed plan with patient, he/she acknowledged understanding and agreed with plan of care.  Return precautions given for new or worsening  symptoms.  Larene Pickett, PA-C 09/01/15 [place], MD 09/01/15 1539

## 2015-09-06 DIAGNOSIS — L723 Sebaceous cyst: Secondary | ICD-10-CM | POA: Diagnosis not present

## 2015-09-06 DIAGNOSIS — Z1389 Encounter for screening for other disorder: Secondary | ICD-10-CM | POA: Diagnosis not present

## 2015-09-06 DIAGNOSIS — Z23 Encounter for immunization: Secondary | ICD-10-CM | POA: Diagnosis not present

## 2015-09-09 ENCOUNTER — Encounter (HOSPITAL_BASED_OUTPATIENT_CLINIC_OR_DEPARTMENT_OTHER): Payer: Self-pay | Admitting: Emergency Medicine

## 2015-10-02 DIAGNOSIS — E1169 Type 2 diabetes mellitus with other specified complication: Secondary | ICD-10-CM | POA: Diagnosis not present

## 2015-10-02 DIAGNOSIS — E1165 Type 2 diabetes mellitus with hyperglycemia: Secondary | ICD-10-CM | POA: Diagnosis not present

## 2015-10-02 DIAGNOSIS — E1140 Type 2 diabetes mellitus with diabetic neuropathy, unspecified: Secondary | ICD-10-CM | POA: Diagnosis not present

## 2015-10-02 DIAGNOSIS — E785 Hyperlipidemia, unspecified: Secondary | ICD-10-CM | POA: Diagnosis not present

## 2015-10-10 ENCOUNTER — Emergency Department (HOSPITAL_BASED_OUTPATIENT_CLINIC_OR_DEPARTMENT_OTHER)
Admission: EM | Admit: 2015-10-10 | Discharge: 2015-10-10 | Disposition: A | Discharge status: Home / Self Care | Payer: Medicare Other | Attending: Physician Assistant | Admitting: Physician Assistant

## 2015-10-10 ENCOUNTER — Encounter (HOSPITAL_BASED_OUTPATIENT_CLINIC_OR_DEPARTMENT_OTHER): Payer: Self-pay | Admitting: Emergency Medicine

## 2015-10-10 ENCOUNTER — Emergency Department (HOSPITAL_BASED_OUTPATIENT_CLINIC_OR_DEPARTMENT_OTHER): Payer: Medicare Other

## 2015-10-10 DIAGNOSIS — Z8659 Personal history of other mental and behavioral disorders: Secondary | ICD-10-CM | POA: Insufficient documentation

## 2015-10-10 DIAGNOSIS — E119 Type 2 diabetes mellitus without complications: Secondary | ICD-10-CM | POA: Diagnosis not present

## 2015-10-10 DIAGNOSIS — J45909 Unspecified asthma, uncomplicated: Secondary | ICD-10-CM | POA: Insufficient documentation

## 2015-10-10 DIAGNOSIS — Z9119 Patient's noncompliance with other medical treatment and regimen: Secondary | ICD-10-CM | POA: Diagnosis not present

## 2015-10-10 DIAGNOSIS — E7800 Pure hypercholesterolemia, unspecified: Secondary | ICD-10-CM | POA: Diagnosis not present

## 2015-10-10 DIAGNOSIS — J111 Influenza due to unidentified influenza virus with other respiratory manifestations: Secondary | ICD-10-CM

## 2015-10-10 DIAGNOSIS — Z87442 Personal history of urinary calculi: Secondary | ICD-10-CM | POA: Diagnosis not present

## 2015-10-10 DIAGNOSIS — Z87891 Personal history of nicotine dependence: Secondary | ICD-10-CM | POA: Insufficient documentation

## 2015-10-10 DIAGNOSIS — Z79899 Other long term (current) drug therapy: Secondary | ICD-10-CM | POA: Insufficient documentation

## 2015-10-10 DIAGNOSIS — Z9981 Dependence on supplemental oxygen: Secondary | ICD-10-CM | POA: Insufficient documentation

## 2015-10-10 DIAGNOSIS — Z8719 Personal history of other diseases of the digestive system: Secondary | ICD-10-CM | POA: Diagnosis not present

## 2015-10-10 DIAGNOSIS — E785 Hyperlipidemia, unspecified: Secondary | ICD-10-CM | POA: Diagnosis not present

## 2015-10-10 DIAGNOSIS — Z7982 Long term (current) use of aspirin: Secondary | ICD-10-CM | POA: Insufficient documentation

## 2015-10-10 DIAGNOSIS — I10 Essential (primary) hypertension: Secondary | ICD-10-CM | POA: Insufficient documentation

## 2015-10-10 DIAGNOSIS — R05 Cough: Secondary | ICD-10-CM | POA: Diagnosis not present

## 2015-10-10 DIAGNOSIS — G4730 Sleep apnea, unspecified: Secondary | ICD-10-CM | POA: Insufficient documentation

## 2015-10-10 DIAGNOSIS — Z794 Long term (current) use of insulin: Secondary | ICD-10-CM | POA: Insufficient documentation

## 2015-10-10 MED ORDER — ACETAMINOPHEN 325 MG PO TABS
650.0000 mg | ORAL_TABLET | Freq: Once | ORAL | Status: AC | PRN
  Administered 2015-10-10: 650 mg via ORAL
  Filled 2015-10-10: qty 2

## 2015-10-10 MED ORDER — GUAIFENESIN 100 MG/5ML PO SOLN
5.0000 mL | Freq: Once | ORAL | Status: AC
  Administered 2015-10-10: 100 mg via ORAL
  Filled 2015-10-10: qty 5

## 2015-10-10 MED ORDER — GUAIFENESIN-CODEINE 100-10 MG/5ML PO SOLN: 5.0000 mL | Freq: Four times a day (QID) | ORAL | Status: DC | PRN

## 2015-10-10 MED ORDER — ONDANSETRON HCL 4 MG PO TABS: 4.0000 mg | ORAL_TABLET | Freq: Three times a day (TID) | ORAL | Status: DC | PRN

## 2015-10-10 NOTE — ED Notes (Signed)
Pt and his wife decline the offer of IV fluids to reduce HR.  Pt is refusing to "be stuck".

## 2015-10-10 NOTE — ED Provider Notes (Signed)
CSN: [id number]     Arrival date & time 10/10/15  1643 History   By signing my name below, I, [name], attest that this documentation has been prepared under the direction and in the presence of Kyndall Chaplin Julio Alm, MD. Electronically Signed: Julien Nordmann, ED Scribe. 10/10/2015. 8:25 PM.     Chief Complaint  Patient presents with  . Cough      The history is provided by the patient. No language interpreter was used.   HPI Comments: Levi Meyers is a 50 y.o. male who has a hx of DM, HLD, GERD, and HTN presents to the Emergency Department complaining of constant, gradual worsening, moderate cough onset 4 days ago. Pt has been having associated generalized body aches, fever, nausea, vomiting, and congestion. He has been taking OTC cold medication to alleviate his symptoms with no relief. Denies any other symptoms.  Past Medical History  Diagnosis Date  . Diabetes mellitus   . Hyperlipidemia   . GERD (gastroesophageal reflux disease)   . History of kidney stones   . Depression   . Asthma   . Kidney disease     stones  . Noncompliance with diabetes treatment   . Sleep apnea     cpap  . Hypertension   . Diabetes mellitus without complication ([place])   . High cholesterol   . Kidney stone    Past Surgical History  Procedure Laterality Date  . Appendectomy  2004  . Cholecystectomy  2004  . Leg surgery    . Strabismus surgery    . Eye surgery  1970  . Knee cartilage surgery    . Skin graft  1974  . Colonoscopy N/A 09/08/2012    Procedure: COLONOSCOPY;  Surgeon: Leighton Ruff, MD;  Location: [facility];  Service: Endoscopy;  Laterality: N/A;  . Cardiac catheterization      Dr. Verl Blalock ([place])  . Hernia repair    . Anterior cervical decomp/discectomy fusion N/A 06/24/2014    Procedure: Cervical five-six anterior cervical decompression with fusion interbody prosthesis with plating and bonegraft;  Surgeon: Consuella Lose, MD;  Location: [facility];  Service:  Neurosurgery;  Laterality: N/A;  Cervical five-six anterior cervical decompression with fusion interbody prosthesis with plating and bonegraft  . Eye surgery    . Skin graft full thickness leg    . Appendectomy    . Cholecystectomy    . Cervical spine surgery     Family History  Problem Relation Age of Onset  . Diabetes Mother   . Heart attack Mother   . Stroke Mother   . Diabetes Father   . Heart disease Other     Parent  . Heart disease Other     Grandparents   Social History  Substance Use Topics  . Smoking status: Former Smoker -- 4 years    Types: Cigarettes  . Smokeless tobacco: Current User    Types: Snuff  . Alcohol Use: No    Review of Systems  Constitutional: Positive for fever and chills.  Respiratory: Positive for cough.   Gastrointestinal: Positive for nausea and vomiting.  All other systems reviewed and are negative.     Allergies  Byetta 10 mcg pen; Codeine; Codeine; Invokana; and Victoza  Home Medications   Prior to Admission medications   Medication Sig Start Date End Date Taking? Authorizing Provider  aspirin 81 MG tablet Take 81 mg by mouth daily.   Yes Historical Provider, MD  aspirin EC 81 MG tablet Take 1  tablet (81 mg total) by mouth daily. 07/04/14  Yes Consuella Lose, MD  atorvastatin (LIPITOR) 20 MG tablet Take 20 mg by mouth daily.   Yes Historical Provider, MD  ATORVASTATIN CALCIUM PO Take by mouth.   Yes Historical Provider, MD  BuPROPion HCl (WELLBUTRIN PO) Take by mouth.    Historical Provider, MD  Dextromethorphan-Guaifenesin (MUCINEX DM) 30-600 MG TB12 Take 1 tablet by mouth 2 (two) times daily. 09/01/15  Yes Larene Pickett, PA-C  Dulaglutide (TRULICITY [place]) Inject into the skin.   Yes Historical Provider, MD  gabapentin (NEURONTIN) 300 MG capsule Take 600 mg by mouth 2 (two) times daily.    Historical Provider, MD  GABAPENTIN PO Take by mouth.    Historical Provider, MD  HYDROcodone-acetaminophen (NORCO/VICODIN) 5-325 MG per  tablet Take 1-2 tablets by mouth every 6 (six) hours as needed for moderate pain.  05/06/14   Historical Provider, MD  ibuprofen (ADVIL,MOTRIN) 200 MG tablet Take 400 mg by mouth 2 (two) times daily as needed (pain).    Historical Provider, MD  insulin aspart (NOVOLOG FLEXPEN) 100 UNIT/ML FlexPen Inject 20 Units into the skin daily before supper.   Yes Historical Provider, MD  Insulin Glargine (LANTUS SOLOSTAR) 100 UNIT/ML Solostar Pen Inject 50 Units into the skin 2 (two) times daily.    Historical Provider, MD  lisinopril (PRINIVIL,ZESTRIL) 40 MG tablet Take 40 mg by mouth daily.   Yes Historical Provider, MD  lisinopril-hydrochlorothiazide (PRINZIDE,ZESTORETIC) 20-12.5 MG per tablet Take 1 tablet by mouth daily. *Patient MUST MAKE APPOINTMENT* 03/07/14   Pixie Casino, MD  [facility] VERIO test strip 1 strip by Does not apply route 3 (three) times daily. 06/13/14  Yes Historical Provider, MD  oxyCODONE-acetaminophen (PERCOCET/ROXICET) 5-325 MG per tablet Take 1-2 tablets by mouth every 4 (four) hours as needed for moderate pain. 03/03/15   Margarita Mail, PA-C   Triage vitals: BP 121/82 mmHg  Pulse 112  Temp(Src) 99.6 F (37.6 C) (Oral)  Resp 20  Ht 5\' 5"  (1.651 m)  Wt 205 lb (92.987 kg)  BMI 34.11 kg/m2  SpO2 98% Physical Exam  Constitutional: He is oriented to person, place, and time. He appears well-developed and well-nourished.  HENT:  Head: Normocephalic and atraumatic.  Mouth/Throat: Oropharynx is clear and moist.  Eyes: Conjunctivae and EOM are normal. Pupils are equal, round, and reactive to light.  Neck: Normal range of motion.  Cardiovascular: Normal rate, regular rhythm and normal heart sounds.   Pulmonary/Chest: Effort normal and breath sounds normal. He has no wheezes.  Abdominal: Soft. Bowel sounds are normal.  Musculoskeletal: Normal range of motion.  Neurological: He is alert and oriented to person, place, and time.  Skin: Skin is warm and dry.  Psychiatric: He has a  normal mood and affect.  Nursing note and vitals reviewed.   ED Course  Procedures  DIAGNOSTIC STUDIES: Oxygen Saturation is 98% on RA, normal by my interpretation.  COORDINATION OF CARE:  8:21 PM Discussed treatment plan which includes cough medication with pt at bedside and pt agreed to plan.  Labs Review Labs Reviewed - No data to display  Imaging Review Dg Chest 2 View  10/10/2015  CLINICAL DATA:  Cough and congestion for several days EXAM: CHEST  2 VIEW COMPARISON:  09/01/2015 FINDINGS: Cardiac shadow is enlarged but stable. The lungs are well aerated bilaterally. Mild peribronchial changes are noted which may represent some mild bronchitis. No focal confluent infiltrate is seen. Postsurgical changes in the cervical spine are noted. Bony  structures are otherwise within normal limits. IMPRESSION: Mild bronchitic changes bilaterally. Electronically Signed   By: Inez Catalina M.D.   On: 10/10/2015 17:28   I have personally reviewed and evaluated these images and lab results as part of my medical decision-making.   EKG Interpretation None      MDM   Final diagnoses:  None    Patient is a 50 year old male presenting with fever, cough, nausea vomiting diarrhea. This is consistent with viral syndrome that is going around. Patient tolerating PO normally, with occasional vomiting.    Patient oxygenating normally.  Pt has tachycardia but is refusing IV or fluids at this time.  Would like to give these things, but we will respect his autonomy.  We will discharge and have him follow up with PCP.   I personally performed the services described in this documentation, which was scribed in my presence. The recorded information has been reviewed and is accurate.      Jahleah Mariscal Julio Alm, MD 10/10/15 2133

## 2015-10-10 NOTE — Discharge Instructions (Signed)
Please follow up with any concerns.    Influenza, Adult Influenza (flu) is an infection in the mouth, nose, and throat (respiratory tract) caused by a virus. The flu can make you feel very ill. Influenza spreads easily from person to person (contagious).  HOME CARE   Only take medicines as told by your doctor.  Use a cool mist humidifier to make breathing easier.  Get plenty of rest until your fever goes away. This usually takes 3 to 4 days.  Drink enough fluids to keep your pee (urine) clear or pale yellow.  Cover your mouth and nose when you cough or sneeze.  Wash your hands well to avoid spreading the flu.  Stay home from work or school until your fever has been gone for at least 1 full day.  Get a flu shot every year. GET HELP RIGHT AWAY IF:   You have trouble breathing or feel short of breath.  Your skin or nails turn blue.  You have severe neck pain or stiffness.  You have a severe headache, facial pain, or earache.  Your fever gets worse or keeps coming back.  You feel sick to your stomach (nauseous), throw up (vomit), or have watery poop (diarrhea).  You have chest pain.  You have a deep cough that gets worse, or you cough up more thick spit (mucus). MAKE SURE YOU:   Understand these instructions.  Will watch your condition.  Will get help right away if you are not doing well or get worse.   This information is not intended to replace advice given to you by your health care provider. Make sure you discuss any questions you have with your health care provider.   Document Released: 04/16/2008 Document Revised: 07/29/2014 Document Reviewed: 10/07/2011 Elsevier Interactive Patient Education [employer].

## 2015-10-10 NOTE — Progress Notes (Signed)
Patient states that he has sleep apnea and has a CPAP at home but that he cannot tolerate wearing it.

## 2015-10-10 NOTE — ED Notes (Signed)
Pt reports cough congestion x 4 days, no improvement with otc cold med

## 2015-11-13 DIAGNOSIS — E119 Type 2 diabetes mellitus without complications: Secondary | ICD-10-CM | POA: Diagnosis not present

## 2015-11-13 DIAGNOSIS — H40033 Anatomical narrow angle, bilateral: Secondary | ICD-10-CM | POA: Diagnosis not present

## 2015-12-26 ENCOUNTER — Encounter (HOSPITAL_BASED_OUTPATIENT_CLINIC_OR_DEPARTMENT_OTHER): Payer: Self-pay | Admitting: Emergency Medicine

## 2015-12-26 ENCOUNTER — Emergency Department (HOSPITAL_BASED_OUTPATIENT_CLINIC_OR_DEPARTMENT_OTHER)
Admission: EM | Admit: 2015-12-26 | Discharge: 2015-12-26 | Disposition: A | Discharge status: Home / Self Care | Payer: Medicare Other | Attending: Emergency Medicine | Admitting: Emergency Medicine

## 2015-12-26 ENCOUNTER — Emergency Department (HOSPITAL_BASED_OUTPATIENT_CLINIC_OR_DEPARTMENT_OTHER): Payer: Medicare Other

## 2015-12-26 DIAGNOSIS — Z794 Long term (current) use of insulin: Secondary | ICD-10-CM | POA: Insufficient documentation

## 2015-12-26 DIAGNOSIS — Z7982 Long term (current) use of aspirin: Secondary | ICD-10-CM | POA: Diagnosis not present

## 2015-12-26 DIAGNOSIS — Z791 Long term (current) use of non-steroidal anti-inflammatories (NSAID): Secondary | ICD-10-CM | POA: Diagnosis not present

## 2015-12-26 DIAGNOSIS — E785 Hyperlipidemia, unspecified: Secondary | ICD-10-CM | POA: Diagnosis not present

## 2015-12-26 DIAGNOSIS — R109 Unspecified abdominal pain: Secondary | ICD-10-CM | POA: Insufficient documentation

## 2015-12-26 DIAGNOSIS — R338 Other retention of urine: Secondary | ICD-10-CM

## 2015-12-26 DIAGNOSIS — E119 Type 2 diabetes mellitus without complications: Secondary | ICD-10-CM | POA: Insufficient documentation

## 2015-12-26 DIAGNOSIS — J45909 Unspecified asthma, uncomplicated: Secondary | ICD-10-CM | POA: Insufficient documentation

## 2015-12-26 DIAGNOSIS — R339 Retention of urine, unspecified: Secondary | ICD-10-CM | POA: Insufficient documentation

## 2015-12-26 DIAGNOSIS — I10 Essential (primary) hypertension: Secondary | ICD-10-CM | POA: Diagnosis not present

## 2015-12-26 DIAGNOSIS — F329 Major depressive disorder, single episode, unspecified: Secondary | ICD-10-CM | POA: Insufficient documentation

## 2015-12-26 DIAGNOSIS — R52 Pain, unspecified: Secondary | ICD-10-CM

## 2015-12-26 DIAGNOSIS — R110 Nausea: Secondary | ICD-10-CM | POA: Diagnosis not present

## 2015-12-26 DIAGNOSIS — Z87891 Personal history of nicotine dependence: Secondary | ICD-10-CM | POA: Insufficient documentation

## 2015-12-26 LAB — URINALYSIS, ROUTINE W REFLEX MICROSCOPIC
BILIRUBIN URINE: NEGATIVE
Glucose, UA: >1000 mg/dL — AB
Hgb urine dipstick: NEGATIVE
KETONES UR: NEGATIVE mg/dL
LEUKOCYTES UA: NEGATIVE
NITRITE: NEGATIVE
Protein, ur: NEGATIVE mg/dL
Specific Gravity, Urine: 1.03 (ref 1.005–1.030)
pH: 5.5 (ref 5.0–8.0)

## 2015-12-26 LAB — BASIC METABOLIC PANEL
Anion gap: 7 (ref 5–15)
BUN: 9 mg/dL (ref 6–20)
CHLORIDE: 106 mmol/L (ref 101–111)
CO2: 25 mmol/L (ref 22–32)
CREATININE: 0.92 mg/dL (ref 0.61–1.24)
Calcium: 8.7 mg/dL — ABNORMAL LOW (ref 8.9–10.3)
GFR calc Af Amer: >60 mL/min (ref >60)
GFR calc non Af Amer: >60 mL/min (ref >60)
GLUCOSE: 166 mg/dL — AB (ref 65–99)
Potassium: 3.9 mmol/L (ref 3.5–5.1)
Sodium: 138 mmol/L (ref 135–145)

## 2015-12-26 LAB — URINE MICROSCOPIC-ADD ON
RBC / HPF: NONE SEEN RBC/hpf (ref 0–5)
WBC UA: NONE SEEN WBC/hpf (ref 0–5)

## 2015-12-26 MED ORDER — PHENAZOPYRIDINE HCL 100 MG PO TABS
200.0000 mg | ORAL_TABLET | Freq: Once | ORAL | Status: AC
  Administered 2015-12-26: 200 mg via ORAL
  Filled 2015-12-26: qty 2

## 2015-12-26 MED ORDER — TAMSULOSIN HCL 0.4 MG PO CAPS
0.4000 mg | ORAL_CAPSULE | Freq: Every day | ORAL | Status: DC
  Administered 2015-12-26: 0.4 mg via ORAL
  Filled 2015-12-26: qty 1

## 2015-12-26 MED ORDER — KETOROLAC TROMETHAMINE 30 MG/ML IJ SOLN
30.0000 mg | Freq: Once | INTRAMUSCULAR | Status: AC
  Administered 2015-12-26: 30 mg via INTRAVENOUS
  Filled 2015-12-26: qty 1

## 2015-12-26 MED ORDER — TAMSULOSIN HCL 0.4 MG PO CAPS: 0.4000 mg | ORAL_CAPSULE | Freq: Every day | ORAL | Status: DC

## 2015-12-26 MED ORDER — DOXYCYCLINE HYCLATE 100 MG PO CAPS: 100.0000 mg | ORAL_CAPSULE | Freq: Two times a day (BID) | ORAL | Status: DC

## 2015-12-26 MED ORDER — ONDANSETRON HCL 4 MG/2ML IJ SOLN
4.0000 mg | Freq: Once | INTRAMUSCULAR | Status: AC
  Administered 2015-12-26: 4 mg via INTRAVENOUS
  Filled 2015-12-26: qty 2

## 2015-12-26 MED ORDER — OXYCODONE-ACETAMINOPHEN 5-325 MG PO TABS: 1.0000 | ORAL_TABLET | Freq: Four times a day (QID) | ORAL | Status: DC | PRN

## 2015-12-26 MED ORDER — SODIUM CHLORIDE 0.9 % IV BOLUS (SEPSIS)
500.0000 mL | Freq: Once | INTRAVENOUS | Status: AC
  Administered 2015-12-26: 500 mL via INTRAVENOUS

## 2015-12-26 NOTE — ED Notes (Signed)
Pt transported to CT via stretcher.  

## 2015-12-26 NOTE — ED Notes (Signed)
MD at bedside. 

## 2015-12-26 NOTE — Discharge Instructions (Signed)
Acute Urinary Retention, Male  Acute urinary retention is when you are unable to pee (urinate). Acute urinary retention is common in older men. Prostates can get bigger, which blocks the flow of pee.   HOME CARE  · Drink enough fluids to keep your pee clear or pale yellow.  · If you are sent home with a tube that drains the bladder (catheter), there will be a drainage bag attached to it. There are two types of bags. One is big that you can wear at night without having to empty it. One is smaller and needs to be emptied more often.    Keep the drainage bag empty.    Keep the drainage bag lower than your catheter.  · Only take medicine as told by your doctor.  GET HELP IF:  · You have a low-grade fever.  · You have spasms or you are leaking pee when you have spasms.  GET HELP RIGHT AWAY IF:   · You have chills or a fever.  · Your catheter stops draining pee.  · Your catheter falls out.  · You have increased bleeding that does not stop after you have rested and increased the amount of fluids you had been drinking.  MAKE SURE YOU:   · Understand these instructions.  · Will watch your condition.  · Will get help right away if you are not doing well or get worse.     This information is not intended to replace advice given to you by your health care provider. Make sure you discuss any questions you have with your health care provider.     Document Released: 12/25/2007 Document Revised: 11/22/2014 Document Reviewed: 12/17/2012  Elsevier Interactive Patient Education ©2016 [employer]

## 2015-12-26 NOTE — ED Notes (Signed)
Pt c/o left flank pain with nausea and unable to urinate.  Pt reports passing a kidney stone x 2 days ago

## 2015-12-26 NOTE — ED Notes (Signed)
Leg bag placed on pt and instructions given on care.

## 2015-12-26 NOTE — ED Provider Notes (Signed)
CSN: [id number]     Arrival date & time 12/26/15  0508 History   First MD Initiated Contact with Patient 12/26/15 [phone]     Chief Complaint  Patient presents with  . Urinary Retention     (Consider location/radiation/quality/duration/timing/severity/associated sxs/prior Treatment) Patient is a 50 y.o. male presenting with flank pain. The history is provided by the patient.  Flank Pain This is a recurrent problem. The current episode started more than 1 week ago. Episode frequency: resolved following passing a stone per report on Saturday evening and then worsened again on Monday. The problem has been gradually worsening. Pertinent negatives include no chest pain, no headaches and no shortness of breath. Nothing aggravates the symptoms. Nothing relieves the symptoms. Treatments tried: oxycodone. The treatment provided no relief.  Also has not urinated since Monday early in the day.    Past Medical History  Diagnosis Date  . Diabetes mellitus   . Hyperlipidemia   . GERD (gastroesophageal reflux disease)   . History of kidney stones   . Depression   . Asthma   . Kidney disease     stones  . Noncompliance with diabetes treatment   . Sleep apnea     cpap  . Hypertension   . Diabetes mellitus without complication ([place])   . High cholesterol   . Kidney stone    Past Surgical History  Procedure Laterality Date  . Appendectomy  2004  . Cholecystectomy  2004  . Leg surgery    . Strabismus surgery    . Eye surgery  1970  . Knee cartilage surgery    . Skin graft  1974  . Colonoscopy N/A 09/08/2012    Procedure: COLONOSCOPY;  Surgeon: Leighton Ruff, MD;  Location: [facility];  Service: Endoscopy;  Laterality: N/A;  . Cardiac catheterization      Dr. Verl Blalock ([place])  . Hernia repair    . Anterior cervical decomp/discectomy fusion N/A 06/24/2014    Procedure: Cervical five-six anterior cervical decompression with fusion interbody prosthesis with plating and bonegraft;  Surgeon: Consuella Lose, MD;  Location: [place] [facility];  Service: Neurosurgery;  Laterality: N/A;  Cervical five-six anterior cervical decompression with fusion interbody prosthesis with plating and bonegraft  . Eye surgery    . Skin graft full thickness leg    . Appendectomy    . Cholecystectomy    . Cervical spine surgery     Family History  Problem Relation Age of Onset  . Diabetes Mother   . Heart attack Mother   . Stroke Mother   . Diabetes Father   . Heart disease Other     Parent  . Heart disease Other     Grandparents   Social History  Substance Use Topics  . Smoking status: Former Smoker -- 4 years    Types: Cigarettes  . Smokeless tobacco: Current User    Types: Snuff  . Alcohol Use: No    Review of Systems  Respiratory: Negative for shortness of breath.   Cardiovascular: Negative for chest pain.  Genitourinary: Positive for flank pain and difficulty urinating.  Neurological: Negative for headaches.  All other systems reviewed and are negative.     Allergies  Byetta 10 mcg pen; Codeine; Codeine; Invokana; and Victoza  Home Medications   Prior to Admission medications   Medication Sig Start Date End Date Taking? Authorizing Provider  aspirin 81 MG tablet Take 81 mg by mouth daily.   Yes Historical Provider, MD  aspirin EC 81 MG  tablet Take 1 tablet (81 mg total) by mouth daily. 07/04/14  Yes Consuella Lose, MD  atorvastatin (LIPITOR) 20 MG tablet Take 20 mg by mouth daily.   Yes Historical Provider, MD  buPROPion (WELLBUTRIN XL) 150 MG 24 hr tablet Take 150 mg by mouth daily.   Yes Historical Provider, MD  gabapentin (NEURONTIN) 300 MG capsule Take 600 mg by mouth 2 (two) times daily.   Yes Historical Provider, MD  ibuprofen (ADVIL,MOTRIN) 200 MG tablet Take 400 mg by mouth 2 (two) times daily as needed (pain).   Yes Historical Provider, MD  insulin aspart (NOVOLOG FLEXPEN) 100 UNIT/ML FlexPen Inject 20 Units into the skin daily before supper.   Yes Historical Provider,  MD  Insulin Glargine (LANTUS SOLOSTAR) 100 UNIT/ML Solostar Pen Inject 50 Units into the skin 2 (two) times daily.   Yes Historical Provider, MD  lisinopril (PRINIVIL,ZESTRIL) 40 MG tablet Take 40 mg by mouth daily.   Yes Historical Provider, MD  [facility] VERIO test strip 1 strip by Does not apply route 3 (three) times daily. 06/13/14  Yes Historical Provider, MD  ATORVASTATIN CALCIUM PO Take by mouth.    Historical Provider, MD  BuPROPion HCl (WELLBUTRIN PO) Take by mouth.    Historical Provider, MD  Dextromethorphan-Guaifenesin (MUCINEX DM) 30-600 MG TB12 Take 1 tablet by mouth 2 (two) times daily. 09/01/15   Larene Pickett, PA-C  Dulaglutide (TRULICITY [place]) Inject into the skin.    Historical Provider, MD  GABAPENTIN PO Take by mouth.    Historical Provider, MD  guaiFENesin-codeine 100-10 MG/5ML syrup Take 5 mLs by mouth every 6 (six) hours as needed for cough. 10/10/15   Courteney Lyn Mackuen, MD  HYDROcodone-acetaminophen (NORCO/VICODIN) 5-325 MG per tablet Take 1-2 tablets by mouth every 6 (six) hours as needed for moderate pain.  05/06/14   Historical Provider, MD  lisinopril-hydrochlorothiazide (PRINZIDE,ZESTORETIC) 20-12.5 MG per tablet Take 1 tablet by mouth daily. *Patient MUST MAKE APPOINTMENT* 03/07/14   Pixie Casino, MD  ondansetron (ZOFRAN) 4 MG tablet Take 1 tablet (4 mg total) by mouth every 8 (eight) hours as needed for nausea or vomiting. 10/10/15   Courteney Lyn Mackuen, MD  oxyCODONE-acetaminophen (PERCOCET/ROXICET) 5-325 MG per tablet Take 1-2 tablets by mouth every 4 (four) hours as needed for moderate pain. 03/03/15   Abigail Harris, PA-C   BP 151/99 mmHg  Pulse 87  Temp(Src) 98.7 F (37.1 C) (Oral)  Resp 18  Ht 5\' 5"  (1.651 m)  Wt 200 lb (90.719 kg)  BMI 33.28 kg/m2  SpO2 99% Physical Exam  Constitutional: He is oriented to person, place, and time. He appears well-developed and well-nourished. No distress.  HENT:  Head: Normocephalic and atraumatic.  Mouth/Throat:  Oropharynx is clear and moist.  Eyes: Conjunctivae are normal. Pupils are equal, round, and reactive to light.  Neck: Normal range of motion. Neck supple.  Cardiovascular: Normal rate, regular rhythm and intact distal pulses.   Pulmonary/Chest: Effort normal and breath sounds normal. He has no wheezes. He has no rales.  Abdominal: Soft. Bowel sounds are normal. There is no tenderness. There is no rebound and no guarding.  Musculoskeletal: Normal range of motion.  Neurological: He is alert and oriented to person, place, and time.  Skin: Skin is warm and dry.  Psychiatric: He has a normal mood and affect.    ED Course  Procedures (including critical care time) Labs Review Labs Reviewed  URINALYSIS, ROUTINE W REFLEX MICROSCOPIC (NOT AT [facility]) - Abnormal; Notable for the  following:    Glucose, UA >1000 (*)    All other components within normal limits  BASIC METABOLIC PANEL - Abnormal; Notable for the following:    Glucose, Bld 166 (*)    Calcium 8.7 (*)    All other components within normal limits  URINE MICROSCOPIC-ADD ON - Abnormal; Notable for the following:    Squamous Epithelial / LPF 0-5 (*)    Bacteria, UA RARE (*)    All other components within normal limits  URINE CULTURE    Imaging Review No results found. I have personally reviewed and evaluated these images and lab results as part of my medical decision-making.   EKG Interpretation None      MDM   Final diagnoses:  Pain   Filed Vitals:   12/26/15 0512  BP: 151/99  Pulse: 87  Temp: 98.7 F (37.1 C)  Resp: 18    .  Results for orders placed or performed during the hospital encounter of 12/26/15  Urinalysis, Routine w reflex microscopic (not at [facility])  Result Value Ref Range   Color, Urine YELLOW YELLOW   APPearance CLEAR CLEAR   Specific Gravity, Urine 1.030 1.005 - 1.030   pH 5.5 5.0 - 8.0   Glucose, UA >1000 (A) NEGATIVE mg/dL   Hgb urine dipstick NEGATIVE NEGATIVE   Bilirubin Urine NEGATIVE  NEGATIVE   Ketones, ur NEGATIVE NEGATIVE mg/dL   Protein, ur NEGATIVE NEGATIVE mg/dL   Nitrite NEGATIVE NEGATIVE   Leukocytes, UA NEGATIVE NEGATIVE  Basic metabolic panel  Result Value Ref Range   Sodium 138 135 - 145 mmol/L   Potassium 3.9 3.5 - 5.1 mmol/L   Chloride 106 101 - 111 mmol/L   CO2 25 22 - 32 mmol/L   Glucose, Bld 166 (H) 65 - 99 mg/dL   BUN 9 6 - 20 mg/dL   Creatinine, Ser 0.92 0.61 - 1.24 mg/dL   Calcium 8.7 (L) 8.9 - 10.3 mg/dL   GFR calc non Af Amer >60 >60 mL/min   GFR calc Af Amer >60 >60 mL/min   Anion gap 7 5 - 15  Urine microscopic-add on  Result Value Ref Range   Squamous Epithelial / LPF 0-5 (A) NONE SEEN   WBC, UA NONE SEEN 0 - 5 WBC/hpf   RBC / HPF NONE SEEN 0 - 5 RBC/hpf   Bacteria, UA RARE (A) NONE SEEN   Ct Renal Stone Study  12/26/2015  CLINICAL DATA:  Left flank pain for 1.5 days. EXAM: CT ABDOMEN AND PELVIS WITHOUT CONTRAST TECHNIQUE: Multidetector CT imaging of the abdomen and pelvis was performed following the standard protocol without IV contrast. COMPARISON:  12/22/2012 FINDINGS: Lower chest and abdominal wall:  Symmetric gynecomastia. She like calcification along the pericardium, most confluent on the left. This has been present since at least 2010 CT and an echocardiogram was obtained in 2014. Extensive coronary atherosclerotic calcification. Left inguinal hernia repair. Fatty enlargement of the right inguinal canal Hepatobiliary: No focal liver abnormality.Cholecystectomy with normal common bile duct diameter. Pancreas: Unremarkable. Spleen: Unremarkable. Adrenals/Urinary Tract: Negative adrenals. Punctate calculi likely on the bilateral renal papillae as seen on coronal reformats. 42 mm exophytic mass from the lower pole right kidney. This had a simple appearance on enhanced CT of the abdomen 11/29/2008. Urinary bladder is decompressed by Foley catheter. Reproductive:No pathologic findings. Stomach/Bowel:  No obstruction. Appendectomy.  Vascular/Lymphatic: No acute vascular abnormality. No mass or adenopathy. Peritoneal: No ascites or pneumoperitoneum. Musculoskeletal: Mild spondylosis. L5-S1 disc narrowing. No acute abnormalities. IMPRESSION:  1. No acute finding to explain pain. 2. Age advanced coronary atherosclerosis. 3. Chronic pericardial calcification. 4. Probable punctate bilateral renal calculi. Electronically Signed   By: Monte Fantasia M.D.   On: 12/26/2015 06:15    Foley inserted for bladder scan of 410 cc.  Immediately drainage of yellow colorless urine.  No hydronephrosis,   Or obstruction on CT.  Patient initially refused d/c with catheter.  EDP explained at length why this was necessary especially in light of history of renal insuffiency.  Patient would like EDP to place a stent.  EDP explained that only urologist place stents and those are for the ureters and his problem was prostate/bladder related and that requires a foley catheter.  D/c with foley catheter in place as patient has a h/o renal insufficiency.   Follow up in 7 days with urology for in office for voiding trial.  Patient and wife are amenable to this at discharge.      Medication List    TAKE these medications        doxycycline 100 MG capsule  Commonly known as:  VIBRAMYCIN  Take 1 capsule (100 mg total) by mouth 2 (two) times daily. One po bid x 7 days     oxyCODONE-acetaminophen 5-325 MG tablet  Commonly known as:  PERCOCET/ROXICET  Take 1-2 tablets by mouth every 4 (four) hours as needed for moderate pain.     oxyCODONE-acetaminophen 5-325 MG tablet  Commonly known as:  PERCOCET  Take 1 tablet by mouth every 6 (six) hours as needed.     tamsulosin 0.4 MG Caps capsule  Commonly known as:  FLOMAX  Take 1 capsule (0.4 mg total) by mouth daily.      ASK your doctor about these medications        aspirin 81 MG tablet  Take 81 mg by mouth daily.     aspirin EC 81 MG tablet  Take 1 tablet (81 mg total) by mouth daily.     ATORVASTATIN  CALCIUM PO  Take by mouth.     atorvastatin 20 MG tablet  Commonly known as:  LIPITOR  Take 20 mg by mouth daily.     gabapentin 300 MG capsule  Commonly known as:  NEURONTIN  Take 600 mg by mouth 2 (two) times daily.     GABAPENTIN PO  Take by mouth.     guaiFENesin-codeine 100-10 MG/5ML syrup  Take 5 mLs by mouth every 6 (six) hours as needed for cough.     HYDROcodone-acetaminophen 5-325 MG tablet  Commonly known as:  NORCO/VICODIN  Take 1-2 tablets by mouth every 6 (six) hours as needed for moderate pain.     ibuprofen 200 MG tablet  Commonly known as:  ADVIL,MOTRIN  Take 400 mg by mouth 2 (two) times daily as needed (pain).     LANTUS SOLOSTAR 100 UNIT/ML Solostar Pen  Generic drug:  Insulin Glargine  Inject 50 Units into the skin 2 (two) times daily.     lisinopril 40 MG tablet  Commonly known as:  PRINIVIL,ZESTRIL  Take 40 mg by mouth daily.     lisinopril-hydrochlorothiazide 20-12.5 MG tablet  Commonly known as:  PRINZIDE,ZESTORETIC  Take 1 tablet by mouth daily. *Patient MUST MAKE APPOINTMENT*     MUCINEX DM 30-600 MG Tb12  Take 1 tablet by mouth 2 (two) times daily.     NOVOLOG FLEXPEN 100 UNIT/ML FlexPen  Generic drug:  insulin aspart  Inject 20 Units into the skin daily before supper.  ondansetron 4 MG tablet  Commonly known as:  ZOFRAN  Take 1 tablet (4 mg total) by mouth every 8 (eight) hours as needed for nausea or vomiting.     ONETOUCH VERIO test strip  Generic drug:  glucose blood  1 strip by Does not apply route 3 (three) times daily.     TRULICITY [place]  Inject into the skin.     WELLBUTRIN PO  Take by mouth.     buPROPion 150 MG 24 hr tablet  Commonly known as:  WELLBUTRIN XL  Take 150 mg by mouth daily.        Veatrice Kells, MD 12/26/15 [phone]

## 2015-12-27 LAB — URINE CULTURE
CULTURE: NO GROWTH
Special Requests: NORMAL

## 2015-12-29 ENCOUNTER — Emergency Department (HOSPITAL_BASED_OUTPATIENT_CLINIC_OR_DEPARTMENT_OTHER)
Admission: EM | Admit: 2015-12-29 | Discharge: 2015-12-30 | Disposition: A | Discharge status: Home / Self Care | Payer: Medicare PPO | Attending: Emergency Medicine | Admitting: Emergency Medicine

## 2015-12-29 ENCOUNTER — Encounter (HOSPITAL_BASED_OUTPATIENT_CLINIC_OR_DEPARTMENT_OTHER): Payer: Self-pay

## 2015-12-29 DIAGNOSIS — Z79899 Other long term (current) drug therapy: Secondary | ICD-10-CM | POA: Insufficient documentation

## 2015-12-29 DIAGNOSIS — E119 Type 2 diabetes mellitus without complications: Secondary | ICD-10-CM | POA: Insufficient documentation

## 2015-12-29 DIAGNOSIS — Z87891 Personal history of nicotine dependence: Secondary | ICD-10-CM | POA: Diagnosis not present

## 2015-12-29 DIAGNOSIS — F329 Major depressive disorder, single episode, unspecified: Secondary | ICD-10-CM | POA: Insufficient documentation

## 2015-12-29 DIAGNOSIS — E785 Hyperlipidemia, unspecified: Secondary | ICD-10-CM | POA: Insufficient documentation

## 2015-12-29 DIAGNOSIS — Z7982 Long term (current) use of aspirin: Secondary | ICD-10-CM | POA: Diagnosis not present

## 2015-12-29 DIAGNOSIS — Z466 Encounter for fitting and adjustment of urinary device: Secondary | ICD-10-CM | POA: Insufficient documentation

## 2015-12-29 DIAGNOSIS — J45909 Unspecified asthma, uncomplicated: Secondary | ICD-10-CM | POA: Insufficient documentation

## 2015-12-29 DIAGNOSIS — I10 Essential (primary) hypertension: Secondary | ICD-10-CM | POA: Diagnosis not present

## 2015-12-29 DIAGNOSIS — N390 Urinary tract infection, site not specified: Secondary | ICD-10-CM | POA: Diagnosis not present

## 2015-12-29 DIAGNOSIS — Z794 Long term (current) use of insulin: Secondary | ICD-10-CM | POA: Diagnosis not present

## 2015-12-29 LAB — URINE MICROSCOPIC-ADD ON

## 2015-12-29 LAB — URINALYSIS, ROUTINE W REFLEX MICROSCOPIC
GLUCOSE, UA: NEGATIVE mg/dL
Ketones, ur: 15 mg/dL — AB
Nitrite: NEGATIVE
PROTEIN: 100 mg/dL — AB
Specific Gravity, Urine: 1.027 (ref 1.005–1.030)
pH: 5.5 (ref 5.0–8.0)

## 2015-12-29 MED ORDER — CEPHALEXIN 500 MG PO CAPS: 500.0000 mg | ORAL_CAPSULE | Freq: Four times a day (QID) | ORAL | Status: DC

## 2015-12-29 NOTE — ED Provider Notes (Signed)
CSN: [id number]     Arrival date & time 12/29/15  1839 History  By signing my name below, I, Levi Meyers & Levi Meyers, attest that this documentation has been prepared under the direction and in the presence of Levi Quale, MD. Electronically Signed: Julien Meyers, ED Scribe. 12/29/2015. 9:35 PM.    Chief Complaint  Patient presents with  . Foley cath pain    The history is provided by the patient. No language interpreter was used.   HPI Comments: Levi Meyers is a 50 y.o. male who has a PMHx of kidney stones presents to the Emergency Department complaining of constant, gradual worsening, moderate foley catheter pain. Pt states the foley cath was placed on 6/6 due to not being able to urinate from kidney stones. Pt says he has been draining yellow fluid since having it placed. 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 Pt also states he has passed three kidney stones since foley cath was put in. Pt states he has a urologist appointment Tuesday 01/02/16. He denies any other symptoms or complaints.   Past Medical History  Diagnosis Date  . Diabetes mellitus   . Hyperlipidemia   . GERD (gastroesophageal reflux disease)   . History of kidney stones   . Depression   . Asthma   . Kidney disease     stones  . Noncompliance with diabetes treatment   . Sleep apnea     cpap  . Hypertension   . Diabetes mellitus without complication ([place])   . High cholesterol   . Kidney stone    Past Surgical History  Procedure Laterality Date  . Appendectomy  2004  . Cholecystectomy  2004  . Leg surgery    . Strabismus surgery    . Eye surgery  1970  . Knee cartilage surgery    . Skin graft  1974  . Colonoscopy N/A 09/08/2012    Procedure: COLONOSCOPY;  Surgeon: Leighton Ruff, MD;  Location: [facility];  Service: Endoscopy;  Laterality: N/A;  . Cardiac catheterization      Dr. Verl Blalock ([place])  . Hernia  repair    . Anterior cervical decomp/discectomy fusion N/A 06/24/2014    Procedure: Cervical five-six anterior cervical decompression with fusion interbody prosthesis with plating and bonegraft;  Surgeon: Consuella Lose, MD;  Location: [place] [facility];  Service: Neurosurgery;  Laterality: N/A;  Cervical five-six anterior cervical decompression with fusion interbody prosthesis with plating and bonegraft  . Eye surgery    . Skin graft full thickness leg    . Appendectomy    . Cholecystectomy    . Cervical spine surgery     Family History  Problem Relation Age of Onset  . Diabetes Mother   . Heart attack Mother   . Stroke Mother   . Diabetes Father   . Heart disease Other     Parent  . Heart disease Other     Grandparents   Social History  Substance Use Topics  . Smoking status: Former Smoker -- 4 years    Types: Cigarettes  . Smokeless tobacco: Former Systems developer  . Alcohol Use: No    Review of Systems  Constitutional: Negative for fever.  Genitourinary: Positive for penile pain.  All other systems reviewed and are negative.     Allergies  Byetta 10 mcg pen; Codeine; Codeine; Invokana; and Victoza  Home Medications   Prior to Admission medications  Medication Sig Start Date End Date Taking? Authorizing Provider  aspirin 81 MG tablet Take 81 mg by mouth daily.    Historical Provider, MD  aspirin EC 81 MG tablet Take 1 tablet (81 mg total) by mouth daily. 07/04/14   Consuella Lose, MD  atorvastatin (LIPITOR) 20 MG tablet Take 20 mg by mouth daily.    Historical Provider, MD  ATORVASTATIN CALCIUM PO Take by mouth.    Historical Provider, MD  buPROPion (WELLBUTRIN XL) 150 MG 24 hr tablet Take 150 mg by mouth daily.    Historical Provider, MD  BuPROPion HCl (WELLBUTRIN PO) Take by mouth.    Historical Provider, MD  Dextromethorphan-Guaifenesin (MUCINEX DM) 30-600 MG TB12 Take 1 tablet by mouth 2 (two) times daily. 09/01/15   Larene Pickett, PA-C  doxycycline (VIBRAMYCIN) 100 MG  capsule Take 1 capsule (100 mg total) by mouth 2 (two) times daily. One po bid x 7 days 12/26/15   April Palumbo, MD  Dulaglutide (TRULICITY ) Inject into the skin.    Historical Provider, MD  gabapentin (NEURONTIN) 300 MG capsule Take 600 mg by mouth 2 (two) times daily.    Historical Provider, MD  GABAPENTIN PO Take by mouth.    Historical Provider, MD  guaiFENesin-codeine 100-10 MG/5ML syrup Take 5 mLs by mouth every 6 (six) hours as needed for cough. 10/10/15   Courteney Lyn Mackuen, MD  HYDROcodone-acetaminophen (NORCO/VICODIN) 5-325 MG per tablet Take 1-2 tablets by mouth every 6 (six) hours as needed for moderate pain.  05/06/14   Historical Provider, MD  ibuprofen (ADVIL,MOTRIN) 200 MG tablet Take 400 mg by mouth 2 (two) times daily as needed (pain).    Historical Provider, MD  insulin aspart (NOVOLOG FLEXPEN) 100 UNIT/ML FlexPen Inject 20 Units into the skin daily before supper.    Historical Provider, MD  Insulin Glargine (LANTUS SOLOSTAR) 100 UNIT/ML Solostar Pen Inject 50 Units into the skin 2 (two) times daily.    Historical Provider, MD  lisinopril (PRINIVIL,ZESTRIL) 40 MG tablet Take 40 mg by mouth daily.    Historical Provider, MD  lisinopril-hydrochlorothiazide (PRINZIDE,ZESTORETIC) 20-12.5 MG per tablet Take 1 tablet by mouth daily. *Patient MUST MAKE APPOINTMENT* 03/07/14   Pixie Casino, MD  ondansetron (ZOFRAN) 4 MG tablet Take 1 tablet (4 mg total) by mouth every 8 (eight) hours as needed for nausea or vomiting. 10/10/15   Courteney Lyn Mackuen, MD  ONETOUCH VERIO test strip 1 strip by Does not apply route 3 (three) times daily. 06/13/14   Historical Provider, MD  oxyCODONE-acetaminophen (PERCOCET) 5-325 MG tablet Take 1 tablet by mouth every 6 (six) hours as needed. 12/26/15   April Palumbo, MD  oxyCODONE-acetaminophen (PERCOCET/ROXICET) 5-325 MG per tablet Take 1-2 tablets by mouth every 4 (four) hours as needed for moderate pain. 03/03/15   Margarita Mail, PA-C  tamsulosin  (FLOMAX) 0.4 MG CAPS capsule Take 1 capsule (0.4 mg total) by mouth daily. 12/26/15   April Palumbo, MD   Triage vitals: BP 116/81 mmHg  Pulse 96  Temp(Src) 98.5 F (36.9 C) (Oral)  Resp 20  Ht 5\' 5"  (1.651 m)  Wt 196 lb (88.905 kg)  BMI 32.62 kg/m2  SpO2 96% Physical Exam  Constitutional: He is oriented to person, place, and time. He appears well-developed and well-nourished.  HENT:  Head: Normocephalic and atraumatic.  Eyes: EOM are normal. Pupils are equal, round, and reactive to light.  Neck: Normal range of motion.  Cardiovascular: Tachycardia present.   Pulmonary/Chest: Effort normal.  Abdominal:  Soft. There is no tenderness.  Genitourinary: Penile tenderness (around foley catheter) present. No penile erythema. Discharge (mild yellowish around foley catheter that is thick) found.  Musculoskeletal: Normal range of motion.  Neurological: He is alert and oriented to person, place, and time.  Skin: Skin is dry. No rash noted.  Nursing note and vitals reviewed.   ED Course  Procedures (including critical care time) DIAGNOSTIC STUDIES: Oxygen Saturation is 96% on RA, adequate by my interpretation.  COORDINATION OF CARE: 9:32 PM Discussed treatment plan which includes taking out foley cath with pt at bedside and pt agreed to plan. Pt was further advised to keep his appointment with his urologist on Wednesday.  Labs Review Labs Reviewed - No data to display  Imaging Review No results found. I have personally reviewed and evaluated these images and lab results as part of my medical decision-making.   EKG Interpretation None      MDM  PAtient seen and evaluated in stable condition.  Foley catheter removed.  Patient able to void without difficulty.  Felt significantly improved.  UA concerning for infection.  Patient prescribed KEflex and discharged home in stable condition to follow up with his urologist.  Strict return precautions given .  Patient expressed understanding of  need to return if unable to urinate. Final diagnoses:  None    1. UTI  2. Foley catheter removal  I personally performed the services described in this documentation, which was scribed in my presence. The recorded information has been reviewed and is accurate.   Levi Quale, MD 01/01/16 [phone]

## 2015-12-29 NOTE — ED Notes (Signed)
C/o foley pain  cathter was placed here Tuesday pm

## 2015-12-29 NOTE — Discharge Instructions (Signed)
You were seen and evaluated tonight for the pain you're having with the Foley catheter in place. It was noted on your urine sample that you likely have a urinary tract infection. Please take the antibiotics prescribed. Full he catheter was removed but if you find yourself unable to urinate at home please return for reevaluation. Follow-up with your urologist next week.  Urinary Tract Infection Urinary tract infections (UTIs) can develop anywhere along your urinary tract. Your urinary tract is your body's drainage system for removing wastes and extra water. Your urinary tract includes two kidneys, two ureters, a bladder, and a urethra. Your kidneys are a pair of bean-shaped organs. Each kidney is about the size of your fist. They are located below your ribs, one on each side of your spine. CAUSES Infections are caused by microbes, which are microscopic organisms, including fungi, viruses, and bacteria. These organisms are so small that they can only be seen through a microscope. Bacteria are the microbes that most commonly cause UTIs. SYMPTOMS  Symptoms of UTIs may vary by age and gender of the patient and by the location of the infection. Symptoms in young women typically include a frequent and intense urge to urinate and a painful, burning feeling in the bladder or urethra during urination. Older women and men are more likely to be tired, shaky, and weak and have muscle aches and abdominal pain. A fever may mean the infection is in your kidneys. Other symptoms of a kidney infection include pain in your back or sides below the ribs, nausea, and vomiting. DIAGNOSIS To diagnose a UTI, your caregiver will ask you about your symptoms. Your caregiver will also ask you to provide a urine sample. The urine sample will be tested for bacteria and white blood cells. White blood cells are made by your body to help fight infection. TREATMENT  Typically, UTIs can be treated with medication. Because most UTIs are  caused by a bacterial infection, they usually can be treated with the use of antibiotics. The choice of antibiotic and length of treatment depend on your symptoms and the type of bacteria causing your infection. HOME CARE INSTRUCTIONS  If you were prescribed antibiotics, take them exactly as your caregiver instructs you. Finish the medication even if you feel better after you have only taken some of the medication.  Drink enough water and fluids to keep your urine clear or pale yellow.  Avoid caffeine, tea, and carbonated beverages. They tend to irritate your bladder.  Empty your bladder often. Avoid holding urine for long periods of time.  Empty your bladder before and after sexual intercourse.  After a bowel movement, women should cleanse from front to back. Use each tissue only once. SEEK MEDICAL CARE IF:   You have back pain.  You develop a fever.  Your symptoms do not begin to resolve within 3 days. SEEK IMMEDIATE MEDICAL CARE IF:   You have severe back pain or lower abdominal pain.  You develop chills.  You have nausea or vomiting.  You have continued burning or discomfort with urination. MAKE SURE YOU:   Understand these instructions.  Will watch your condition.  Will get help right away if you are not doing well or get worse.   This information is not intended to replace advice given to you by your health care provider. Make sure you discuss any questions you have with your health care provider.   Document Released: 04/17/2005 Document Revised: 03/29/2015 Document Reviewed: 08/16/2011 Elsevier Interactive Patient Education 2016  Elsevier Inc. ° °

## 2015-12-29 NOTE — ED Notes (Signed)
C/o foley cath pain since this am-was able to work today-states is still draining yellow urine-NAD-steady gait-stood in triage

## 2015-12-29 NOTE — ED Notes (Signed)
Pt was able to urinate approx 10  Cc after foley removed,

## 2015-12-31 LAB — URINE CULTURE

## 2016-02-09 DIAGNOSIS — E1140 Type 2 diabetes mellitus with diabetic neuropathy, unspecified: Secondary | ICD-10-CM | POA: Diagnosis not present

## 2016-02-09 DIAGNOSIS — E1169 Type 2 diabetes mellitus with other specified complication: Secondary | ICD-10-CM | POA: Diagnosis not present

## 2016-02-09 DIAGNOSIS — E785 Hyperlipidemia, unspecified: Secondary | ICD-10-CM | POA: Diagnosis not present

## 2016-02-09 DIAGNOSIS — E1165 Type 2 diabetes mellitus with hyperglycemia: Secondary | ICD-10-CM | POA: Diagnosis not present

## 2016-04-22 DIAGNOSIS — Z6831 Body mass index (BMI) 31.0-31.9, adult: Secondary | ICD-10-CM | POA: Diagnosis not present

## 2016-04-22 DIAGNOSIS — J019 Acute sinusitis, unspecified: Secondary | ICD-10-CM | POA: Diagnosis not present

## 2016-04-22 DIAGNOSIS — Z1389 Encounter for screening for other disorder: Secondary | ICD-10-CM | POA: Diagnosis not present

## 2016-04-22 DIAGNOSIS — J069 Acute upper respiratory infection, unspecified: Secondary | ICD-10-CM | POA: Diagnosis not present

## 2016-08-21 ENCOUNTER — Emergency Department (HOSPITAL_BASED_OUTPATIENT_CLINIC_OR_DEPARTMENT_OTHER): Payer: BLUE CROSS/BLUE SHIELD

## 2016-08-21 ENCOUNTER — Encounter (HOSPITAL_BASED_OUTPATIENT_CLINIC_OR_DEPARTMENT_OTHER): Payer: Self-pay

## 2016-08-21 ENCOUNTER — Emergency Department (HOSPITAL_BASED_OUTPATIENT_CLINIC_OR_DEPARTMENT_OTHER)
Admission: EM | Admit: 2016-08-21 | Discharge: 2016-08-21 | Disposition: A | Discharge status: Home / Self Care | Payer: BLUE CROSS/BLUE SHIELD | Attending: Emergency Medicine | Admitting: Emergency Medicine

## 2016-08-21 DIAGNOSIS — J45909 Unspecified asthma, uncomplicated: Secondary | ICD-10-CM | POA: Diagnosis not present

## 2016-08-21 DIAGNOSIS — Z79899 Other long term (current) drug therapy: Secondary | ICD-10-CM | POA: Insufficient documentation

## 2016-08-21 DIAGNOSIS — I10 Essential (primary) hypertension: Secondary | ICD-10-CM | POA: Insufficient documentation

## 2016-08-21 DIAGNOSIS — Z7982 Long term (current) use of aspirin: Secondary | ICD-10-CM | POA: Insufficient documentation

## 2016-08-21 DIAGNOSIS — Z87891 Personal history of nicotine dependence: Secondary | ICD-10-CM | POA: Diagnosis not present

## 2016-08-21 DIAGNOSIS — R109 Unspecified abdominal pain: Secondary | ICD-10-CM | POA: Diagnosis not present

## 2016-08-21 DIAGNOSIS — K429 Umbilical hernia without obstruction or gangrene: Secondary | ICD-10-CM | POA: Diagnosis not present

## 2016-08-21 DIAGNOSIS — Z794 Long term (current) use of insulin: Secondary | ICD-10-CM | POA: Diagnosis not present

## 2016-08-21 DIAGNOSIS — E109 Type 1 diabetes mellitus without complications: Secondary | ICD-10-CM | POA: Diagnosis not present

## 2016-08-21 LAB — BASIC METABOLIC PANEL
ANION GAP: 8 (ref 5–15)
BUN: 11 mg/dL (ref 6–20)
CHLORIDE: 102 mmol/L (ref 101–111)
CO2: 27 mmol/L (ref 22–32)
Calcium: 9 mg/dL (ref 8.9–10.3)
Creatinine, Ser: 0.86 mg/dL (ref 0.61–1.24)
GFR calc Af Amer: >60 mL/min (ref >60)
GFR calc non Af Amer: >60 mL/min (ref >60)
GLUCOSE: 149 mg/dL — AB (ref 65–99)
POTASSIUM: 3.9 mmol/L (ref 3.5–5.1)
Sodium: 137 mmol/L (ref 135–145)

## 2016-08-21 LAB — CBC WITH DIFFERENTIAL/PLATELET
Basophils Absolute: 0.1 10*3/uL (ref 0.0–0.1)
Basophils Relative: 0 %
EOS PCT: 2 %
Eosinophils Absolute: 0.2 10*3/uL (ref 0.0–0.7)
HCT: 47.1 % (ref 39.0–52.0)
Hemoglobin: 16 g/dL (ref 13.0–17.0)
LYMPHS ABS: 3.6 10*3/uL (ref 0.7–4.0)
LYMPHS PCT: 30 %
MCH: 30.4 pg (ref 26.0–34.0)
MCHC: 34 g/dL (ref 30.0–36.0)
MCV: 89.4 fL (ref 78.0–100.0)
MONO ABS: 1.2 10*3/uL — AB (ref 0.1–1.0)
MONOS PCT: 10 %
Neutro Abs: 7 10*3/uL (ref 1.7–7.7)
Neutrophils Relative %: 58 %
PLATELETS: 365 10*3/uL (ref 150–400)
RBC: 5.27 MIL/uL (ref 4.22–5.81)
RDW: 13.5 % (ref 11.5–15.5)
WBC: 12.2 10*3/uL — ABNORMAL HIGH (ref 4.0–10.5)

## 2016-08-21 MED ORDER — IOPAMIDOL (ISOVUE-300) INJECTION 61%
100.0000 mL | Freq: Once | INTRAVENOUS | Status: AC | PRN
  Administered 2016-08-21: 100 mL via INTRAVENOUS

## 2016-08-21 MED ORDER — ONDANSETRON HCL 4 MG/2ML IJ SOLN
4.0000 mg | Freq: Once | INTRAMUSCULAR | Status: AC
  Administered 2016-08-21: 4 mg via INTRAVENOUS
  Filled 2016-08-21: qty 2

## 2016-08-21 MED ORDER — SODIUM CHLORIDE 0.9 % IV BOLUS (SEPSIS)
500.0000 mL | Freq: Once | INTRAVENOUS | Status: AC
  Administered 2016-08-21: 500 mL via INTRAVENOUS

## 2016-08-21 NOTE — ED Notes (Signed)
Patient transported to CT 

## 2016-08-21 NOTE — ED Notes (Addendum)
Pt c/o pain to umbilical area after sneezing.  No bulges noted, no n/v/d.  Pt has been able to drink fluids and belch since the incident.

## 2016-08-21 NOTE — ED Notes (Signed)
Patient transported to X-ray 

## 2016-08-21 NOTE — ED Triage Notes (Signed)
C/o lower abd pain after a sneeze today-thinks it may be a hernia-hx of inguinal hernia-NAD-steady gait

## 2016-08-21 NOTE — ED Provider Notes (Signed)
[facility]PT [facility] Provider Note   CSN: [id number] Arrival date & time: 08/21/16  [id number]  By signing my name below, I, Jaquelyn Bitter., attest that this documentation has been prepared under the direction and in the presence of Davonna Belling, MD. Electronically signed: Jaquelyn Bitter., ED Scribe. 08/21/16. 11:12 PM.    History   Chief Complaint Chief Complaint  Patient presents with  . Abdominal Pain    HPI  GAVINO MONTANTE is a 51 y.o. male with hx of inguinal hernia, hx of cholecystectomy, appendectomy who presents to the Emergency Department complaining of moderate to severe abdominal pain with sudden onset x1 day. Pt states that he sneezed earlier and started having severe abdominal pain. He reports nausea. Pt denies taking anything for the pain. He denies vomiting, diarrhea, constipation, fever, chills.    The history is provided by the patient. No language interpreter was used.    Past Medical History:  Diagnosis Date  . Asthma   . Depression   . Diabetes mellitus   . Diabetes mellitus without complication ([place])   . GERD (gastroesophageal reflux disease)   . High cholesterol   . History of kidney stones   . Hyperlipidemia   . Hypertension   . Kidney disease    stones  . Kidney stone   . Noncompliance with diabetes treatment   . Sleep apnea    cpap    Patient Active Problem List   Diagnosis Date Noted  . Cervical spondylosis with radiculopathy 06/24/2014  . OSA on CPAP 02/18/2013  . DOE (dyspnea on exertion) 01/07/2013  . Tachycardia 01/07/2013  . Edema extremities 01/07/2013  . Bipolar affective disorder ([place]) 03/31/2012  . Type 1 diabetes mellitus with hypoglycemia ([place]) 11/22/2011  . Amblyopia 11/22/2011  . Bronchitis, acute 11/03/2011  . DIABETES MELLITUS, TYPE I 04/28/2008  . HYPERLIPIDEMIA 04/28/2008  . RHEUMATIC FEVER 04/28/2008  . HYPERTENSION 04/28/2008  . RENAL CALCULUS, HX OF 04/28/2008  . CHICKENPOX, HX OF  04/28/2008    Past Surgical History:  Procedure Laterality Date  . ANTERIOR CERVICAL DECOMP/DISCECTOMY FUSION N/A 06/24/2014   Procedure: Cervical five-six anterior cervical decompression with fusion interbody prosthesis with plating and bonegraft;  Surgeon: Consuella Lose, MD;  Location: [facility];  Service: Neurosurgery;  Laterality: N/A;  Cervical five-six anterior cervical decompression with fusion interbody prosthesis with plating and bonegraft  . APPENDECTOMY  2004  . APPENDECTOMY    . CARDIAC CATHETERIZATION     Dr. Verl Blalock ([place])  . CERVICAL SPINE SURGERY    . CHOLECYSTECTOMY  2004  . CHOLECYSTECTOMY    . COLONOSCOPY N/A 09/08/2012   Procedure: COLONOSCOPY;  Surgeon: Leighton Ruff, MD;  Location: [facility];  Service: Endoscopy;  Laterality: N/A;  . EYE SURGERY  1970  . EYE SURGERY    . HERNIA REPAIR    . KNEE CARTILAGE SURGERY    . LEG SURGERY    . SKIN GRAFT  1974  . SKIN GRAFT FULL THICKNESS LEG    . STRABISMUS SURGERY         Home Medications    Prior to Admission medications   Medication Sig Start Date End Date Taking? Authorizing Provider  aspirin 81 MG tablet Take 81 mg by mouth daily.    Historical Provider, MD  aspirin EC 81 MG tablet Take 1 tablet (81 mg total) by mouth daily. 07/04/14   Consuella Lose, MD  atorvastatin (LIPITOR) 20 MG tablet Take 20 mg by mouth daily.  Historical Provider, MD  ATORVASTATIN CALCIUM PO Take by mouth.    Historical Provider, MD  buPROPion (WELLBUTRIN XL) 150 MG 24 hr tablet Take 150 mg by mouth daily.    Historical Provider, MD  Dulaglutide (TRULICITY ) Inject into the skin.    Historical Provider, MD  gabapentin (NEURONTIN) 300 MG capsule Take 600 mg by mouth 2 (two) times daily.    Historical Provider, MD  GABAPENTIN PO Take by mouth.    Historical Provider, MD  ibuprofen (ADVIL,MOTRIN) 200 MG tablet Take 400 mg by mouth 2 (two) times daily as needed (pain).    Historical Provider, MD  insulin aspart (NOVOLOG  FLEXPEN) 100 UNIT/ML FlexPen Inject 20 Units into the skin daily before supper.    Historical Provider, MD  Insulin Glargine (LANTUS SOLOSTAR) 100 UNIT/ML Solostar Pen Inject 50 Units into the skin 2 (two) times daily.    Historical Provider, MD  lisinopril (PRINIVIL,ZESTRIL) 40 MG tablet Take 40 mg by mouth daily.    Historical Provider, MD  lisinopril-hydrochlorothiazide (PRINZIDE,ZESTORETIC) 20-12.5 MG per tablet Take 1 tablet by mouth daily. *Patient MUST MAKE APPOINTMENT* 03/07/14   Pixie Casino, MD  [facility] VERIO test strip 1 strip by Does not apply route 3 (three) times daily. 06/13/14   Historical Provider, MD    Family History Family History  Problem Relation Age of Onset  . Diabetes Mother   . Heart attack Mother   . Stroke Mother   . Diabetes Father   . Heart disease Other     Parent  . Heart disease Other     Grandparents    Social History Social History  Substance Use Topics  . Smoking status: Former Smoker    Years: 4.00    Types: Cigarettes  . Smokeless tobacco: Former Systems developer  . Alcohol use No     Allergies   Byetta 10 mcg pen [exenatide]; Codeine; Codeine; Invokana [canagliflozin]; and Victoza [liraglutide]   Review of Systems Review of Systems  Constitutional: Negative for chills and fever.  Gastrointestinal: Positive for abdominal pain and nausea. Negative for constipation, diarrhea and vomiting.     Physical Exam Updated Vital Signs BP 156/100 (BP Location: Right Arm)   Pulse 76   Temp 98 F (36.7 C) (Oral)   Resp 18   SpO2 96%   Physical Exam  Constitutional: He appears well-developed and well-nourished.  HENT:  Head: Normocephalic and atraumatic.  Eyes: Conjunctivae are normal.  Neck: Neck supple.  Cardiovascular: Normal rate and regular rhythm.   No murmur heard. Pulmonary/Chest: Effort normal and breath sounds normal. No respiratory distress.  Lungs clear.  Abdominal: Soft. He exhibits distension. There is tenderness. A hernia is  present.  Reducible umbilical hernia with mild abdominal tenderness and distention.   Musculoskeletal: He exhibits no edema.  No peripheral edema  Neurological: He is alert.  Skin: Skin is warm and dry.  Psychiatric: He has a normal mood and affect.  Nursing note and vitals reviewed.    ED Treatments / Results   DIAGNOSTIC STUDIES: Oxygen Saturation is 98% on RA, normal by my interpretation.   COORDINATION OF CARE: 11:12 PM-Discussed next steps with pt. Pt verbalized understanding and is agreeable with the plan.    Labs (all labs ordered are listed, but only abnormal results are displayed) Labs Reviewed  CBC WITH DIFFERENTIAL/PLATELET - Abnormal; Notable for the following:       Result Value   WBC 12.2 (*)    Monocytes Absolute 1.2 (*)  All other components within normal limits  BASIC METABOLIC PANEL - Abnormal; Notable for the following:    Glucose, Bld 149 (*)    All other components within normal limits    EKG  EKG Interpretation None       Radiology Ct Abdomen Pelvis W Contrast  Result Date: 08/21/2016 CLINICAL DATA:  Acute onset of periumbilical abdominal pain, sneezing and nausea. Initial encounter. EXAM: CT ABDOMEN AND PELVIS WITH CONTRAST TECHNIQUE: Multidetector CT imaging of the abdomen and pelvis was performed using the standard protocol following bolus administration of intravenous contrast. CONTRAST:  186mL ISOVUE-300 IOPAMIDOL (ISOVUE-300) INJECTION 61% COMPARISON:  Renal ultrasound performed 03/03/2015, and CT of the abdomen and pelvis from 12/26/2015 FINDINGS: Lower chest: Minimal bibasilar atelectasis is noted. Pericardial calcification raises concern for chronic sequelae of pericarditis. Hepatobiliary: The liver is unremarkable in appearance. The patient is status post cholecystectomy, with clips noted at the gallbladder fossa. The common bile duct remains normal in caliber. Pancreas: The pancreas is within normal limits. Spleen: The spleen is  unremarkable in appearance. Adrenals/Urinary Tract: The adrenal glands are unremarkable in appearance. A 4.1 cm right renal cyst is noted. There is no evidence of hydronephrosis. No renal or ureteral stones are identified. Mild nonspecific perinephric stranding is noted bilaterally. Stomach/Bowel: The stomach is unremarkable in appearance. The small bowel is within normal limits. The patient is status post appendectomy. The colon is unremarkable in appearance. Vascular/Lymphatic: The abdominal aorta is unremarkable in appearance. The inferior vena cava is grossly unremarkable. No retroperitoneal lymphadenopathy is seen. No pelvic sidewall lymphadenopathy is identified. Reproductive: The bladder is mildly distended and grossly unremarkable. The prostate remains normal in size. Other: Minimal soft tissue inflammation is noted along the lower anterior abdominal wall. The umbilicus is grossly unremarkable. Musculoskeletal: No acute osseous abnormalities are identified. The visualized musculature is unremarkable in appearance. IMPRESSION: 1. No acute abnormality seen within the abdomen or pelvis. 2. Right renal cyst noted. 3. Minimal soft tissue inflammation along the lower anterior abdominal wall. 4. 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 Would correlate with the patient's history. Electronically Signed   By: Garald Balding M.D.   On: 08/21/2016 22:39   Dg Abd 2 Views  Result Date: 08/21/2016 CLINICAL DATA:  Severe abdominal pain and distention.  Nausea. EXAM: ABDOMEN - 2 VIEW COMPARISON:  None. FINDINGS: Multiple mildly dilated small bowel loops are seen within the abdomen. Colon gas and stool are seen, however the colon is nondilated. These findings raise suspicion for a partial or early small bowel obstruction. Surgical clips seen in right upper quadrant from prior cholecystectomy. No evidence of free intraperitoneal air. IMPRESSION: Findings suspicious for partial or early  small-bowel obstruction. Electronically Signed   By: Earle Gell M.D.   On: 08/21/2016 19:59    Procedures Procedures (including critical care time)  Medications Ordered in ED Medications  sodium chloride 0.9 % bolus 500 mL (0 mLs Intravenous Stopped 08/21/16 2056)  ondansetron (ZOFRAN) injection 4 mg (4 mg Intravenous Given 08/21/16 2029)  iopamidol (ISOVUE-300) 61 % injection 100 mL (100 mLs Intravenous Contrast Given 08/21/16 2221)     Initial Impression / Assessment and Plan / ED Course  I have reviewed the triage vital signs and the nursing notes.  Pertinent labs & imaging results that were available during my care of the patient were reviewed by me and considered in my medical decision making (see chart for details).     Patient with small reducible umbilical hernia. Does have tenderness.  Some mild distention. CT scan done after x-ray showed possible instructions. CT scan did not show clear hernia or obstruction. Will discharge home.  Final Clinical Impressions(s) / ED Diagnoses   Final diagnoses:  Hernia, umbilical  Umbilical hernia without obstruction and without gangrene    New Prescriptions New Prescriptions   No medications on file   I personally performed the services described in this documentation, which was scribed in my presence. The recorded information has been reviewed and is accurate.       Davonna Belling, MD 08/21/16 2312

## 2016-09-25 DIAGNOSIS — E1165 Type 2 diabetes mellitus with hyperglycemia: Secondary | ICD-10-CM | POA: Diagnosis not present

## 2016-09-25 DIAGNOSIS — E1140 Type 2 diabetes mellitus with diabetic neuropathy, unspecified: Secondary | ICD-10-CM | POA: Diagnosis not present

## 2016-09-25 DIAGNOSIS — E785 Hyperlipidemia, unspecified: Secondary | ICD-10-CM | POA: Diagnosis not present

## 2016-09-25 DIAGNOSIS — E1169 Type 2 diabetes mellitus with other specified complication: Secondary | ICD-10-CM | POA: Diagnosis not present

## 2016-09-30 ENCOUNTER — Encounter (HOSPITAL_COMMUNITY): Payer: Self-pay | Admitting: Emergency Medicine

## 2016-09-30 ENCOUNTER — Emergency Department (HOSPITAL_COMMUNITY): Payer: BLUE CROSS/BLUE SHIELD

## 2016-09-30 ENCOUNTER — Emergency Department (HOSPITAL_COMMUNITY)
Admission: EM | Admit: 2016-09-30 | Discharge: 2016-09-30 | Disposition: A | Discharge status: Home / Self Care | Payer: BLUE CROSS/BLUE SHIELD | Attending: Emergency Medicine | Admitting: Emergency Medicine

## 2016-09-30 DIAGNOSIS — K429 Umbilical hernia without obstruction or gangrene: Secondary | ICD-10-CM | POA: Diagnosis not present

## 2016-09-30 DIAGNOSIS — R1033 Periumbilical pain: Secondary | ICD-10-CM

## 2016-09-30 DIAGNOSIS — E109 Type 1 diabetes mellitus without complications: Secondary | ICD-10-CM | POA: Insufficient documentation

## 2016-09-30 DIAGNOSIS — J45909 Unspecified asthma, uncomplicated: Secondary | ICD-10-CM | POA: Insufficient documentation

## 2016-09-30 DIAGNOSIS — F3110 Bipolar disorder, current episode manic without psychotic features, unspecified: Secondary | ICD-10-CM | POA: Diagnosis not present

## 2016-09-30 DIAGNOSIS — R109 Unspecified abdominal pain: Secondary | ICD-10-CM | POA: Diagnosis not present

## 2016-09-30 DIAGNOSIS — R110 Nausea: Secondary | ICD-10-CM

## 2016-09-30 DIAGNOSIS — I10 Essential (primary) hypertension: Secondary | ICD-10-CM | POA: Insufficient documentation

## 2016-09-30 DIAGNOSIS — E782 Mixed hyperlipidemia: Secondary | ICD-10-CM | POA: Diagnosis not present

## 2016-09-30 DIAGNOSIS — M6200 Separation of muscle (nontraumatic), unspecified site: Secondary | ICD-10-CM | POA: Diagnosis not present

## 2016-09-30 DIAGNOSIS — Z87891 Personal history of nicotine dependence: Secondary | ICD-10-CM | POA: Diagnosis not present

## 2016-09-30 DIAGNOSIS — Z7982 Long term (current) use of aspirin: Secondary | ICD-10-CM | POA: Diagnosis not present

## 2016-09-30 DIAGNOSIS — Z6831 Body mass index (BMI) 31.0-31.9, adult: Secondary | ICD-10-CM | POA: Diagnosis not present

## 2016-09-30 LAB — CBC WITH DIFFERENTIAL/PLATELET
BASOS PCT: 1 %
Basophils Absolute: 0 10*3/uL (ref 0.0–0.1)
EOS ABS: 0.1 10*3/uL (ref 0.0–0.7)
EOS PCT: 1 %
HCT: 45.3 % (ref 39.0–52.0)
Hemoglobin: 14.8 g/dL (ref 13.0–17.0)
Lymphocytes Relative: 33 %
Lymphs Abs: 2.8 10*3/uL (ref 0.7–4.0)
MCH: 29.2 pg (ref 26.0–34.0)
MCHC: 32.7 g/dL (ref 30.0–36.0)
MCV: 89.3 fL (ref 78.0–100.0)
MONO ABS: 0.6 10*3/uL (ref 0.1–1.0)
MONOS PCT: 8 %
NEUTROS PCT: 57 %
Neutro Abs: 4.8 10*3/uL (ref 1.7–7.7)
Platelets: 296 10*3/uL (ref 150–400)
RBC: 5.07 MIL/uL (ref 4.22–5.81)
RDW: 13.1 % (ref 11.5–15.5)
WBC: 8.4 10*3/uL (ref 4.0–10.5)

## 2016-09-30 LAB — COMPREHENSIVE METABOLIC PANEL
ALBUMIN: 3.8 g/dL (ref 3.5–5.0)
ALT: 27 U/L (ref 17–63)
ANION GAP: 7 (ref 5–15)
AST: 19 U/L (ref 15–41)
Alkaline Phosphatase: 90 U/L (ref 38–126)
BUN: 10 mg/dL (ref 6–20)
CO2: 24 mmol/L (ref 22–32)
Calcium: 9.1 mg/dL (ref 8.9–10.3)
Chloride: 107 mmol/L (ref 101–111)
Creatinine, Ser: 0.89 mg/dL (ref 0.61–1.24)
GFR calc non Af Amer: >60 mL/min (ref >60)
GLUCOSE: 163 mg/dL — AB (ref 65–99)
POTASSIUM: 4 mmol/L (ref 3.5–5.1)
SODIUM: 138 mmol/L (ref 135–145)
TOTAL PROTEIN: 6.9 g/dL (ref 6.5–8.1)
Total Bilirubin: 0.6 mg/dL (ref 0.3–1.2)

## 2016-09-30 LAB — URINALYSIS, ROUTINE W REFLEX MICROSCOPIC
BILIRUBIN URINE: NEGATIVE
Glucose, UA: 150 mg/dL — AB
Hgb urine dipstick: NEGATIVE
KETONES UR: NEGATIVE mg/dL
LEUKOCYTES UA: NEGATIVE
NITRITE: NEGATIVE
PH: 5 (ref 5.0–8.0)
Protein, ur: NEGATIVE mg/dL
SPECIFIC GRAVITY, URINE: 1.023 (ref 1.005–1.030)

## 2016-09-30 LAB — LIPASE, BLOOD: Lipase: 15 U/L (ref 11–51)

## 2016-09-30 MED ORDER — ONDANSETRON 4 MG PO TBDP: 4.0000 mg | ORAL_TABLET | Freq: Three times a day (TID) | ORAL | 0 refills | Status: DC | PRN

## 2016-09-30 MED ORDER — ONDANSETRON HCL 4 MG/2ML IJ SOLN
4.0000 mg | Freq: Once | INTRAMUSCULAR | Status: AC
  Administered 2016-09-30: 4 mg via INTRAVENOUS
  Filled 2016-09-30: qty 2

## 2016-09-30 MED ORDER — KETOROLAC TROMETHAMINE 30 MG/ML IJ SOLN
15.0000 mg | Freq: Once | INTRAMUSCULAR | Status: AC
  Administered 2016-09-30: 15 mg via INTRAVENOUS
  Filled 2016-09-30: qty 1

## 2016-09-30 MED ORDER — IOPAMIDOL (ISOVUE-300) INJECTION 61%
INTRAVENOUS | Status: AC
  Administered 2016-09-30: 100 mL
  Filled 2016-09-30: qty 100

## 2016-09-30 MED ORDER — SODIUM CHLORIDE 0.9 % IV BOLUS (SEPSIS)
1000.0000 mL | Freq: Once | INTRAVENOUS | Status: AC
  Administered 2016-09-30: 1000 mL via INTRAVENOUS

## 2016-09-30 NOTE — ED Triage Notes (Signed)
Pt reports his pcp diagnosed him with an umbilical hernia a few weeks ago, reports he is having continued pain from it and was told to come to ED by PCP.

## 2016-09-30 NOTE — ED Notes (Signed)
Phlebotomy at bedside.

## 2016-09-30 NOTE — ED Provider Notes (Signed)
[facility] DEPT Provider Note   CSN: [id number] Arrival date & time: 09/30/16  4315     History   Chief Complaint Chief Complaint  Patient presents with  . Hernia    HPI Levi Meyers is a 51 y.o. male with a PMHx of DM2, GERD, HLD, HTN, nephrolithiasis, asthma, sleep apnea, and depression, with a PSHx of inguinal hernia repair, cholecystectomy, and appendectomy, who presents to the ED with complaints of acute worsening of his periumbilical pain related to his umbilical hernia. Chart review reveals he was seen on 08/21/16 for abd pain, was found to have a reducible umbilical hernia however he had an abd xray which showed possible small bowel obstruction, so underwent a CT scan but this was negative for obstruction or any other acute findings (Xray performed before reducing hernia; CT performed afterwards). Patient states that since then he's had some mild periumbilical pain, however 2 days ago distress to "worsened. He describes the pain is 9/10 constant pressure-like nonradiating periumbilical pain worse with laying on his abdomen or coughing, and mildly improved with ibuprofen. Associated symptoms include nausea and bloating/distension. He has not eaten anything solid since last night, only had a sip of Sprite with his medicines today. He admits that he takes NSAIDs on a fairly regular basis, however denies any recent travel, suspicious food intake, sick contacts, or alcohol use. He denies any fevers, chills, CP, SOB, V/D/C, obstipation, melena, hematochezia, hematuria, dysuria, testicular pain/swelling, penile discharge, myalgias, arthralgias, numbness, tingling, focal weakness, or any other complaints at this time.    The history is provided by the patient and medical records. No language interpreter was used.  Abdominal Pain   This is a recurrent problem. The current episode started 2 days ago. The problem occurs constantly. The problem has been gradually worsening. The pain is  associated with an unknown factor. The pain is located in the periumbilical region. The quality of the pain is pressure-like. The pain is at a severity of 9/10. The pain is severe. Associated symptoms include nausea. Pertinent negatives include fever, diarrhea, flatus, hematochezia, melena, vomiting, constipation, dysuria, hematuria, arthralgias and myalgias. The symptoms are aggravated by certain positions and coughing. The symptoms are relieved by NSAIDs. Past workup includes CT scan. His past medical history is significant for GERD. Past medical history comments: and umbilical hernia.    Past Medical History:  Diagnosis Date  . Asthma   . Depression   . Diabetes mellitus   . Diabetes mellitus without complication ([place])   . GERD (gastroesophageal reflux disease)   . High cholesterol   . History of kidney stones   . Hyperlipidemia   . Hypertension   . Kidney disease    stones  . Kidney stone   . Noncompliance with diabetes treatment   . Sleep apnea    cpap    Patient Active Problem List   Diagnosis Date Noted  . Cervical spondylosis with radiculopathy 06/24/2014  . OSA on CPAP 02/18/2013  . DOE (dyspnea on exertion) 01/07/2013  . Tachycardia 01/07/2013  . Edema extremities 01/07/2013  . Bipolar affective disorder ([place]) 03/31/2012  . Type 1 diabetes mellitus with hypoglycemia ([place]) 11/22/2011  . Amblyopia 11/22/2011  . Bronchitis, acute 11/03/2011  . DIABETES MELLITUS, TYPE I 04/28/2008  . HYPERLIPIDEMIA 04/28/2008  . RHEUMATIC FEVER 04/28/2008  . HYPERTENSION 04/28/2008  . RENAL CALCULUS, HX OF 04/28/2008  . CHICKENPOX, HX OF 04/28/2008    Past Surgical History:  Procedure Laterality Date  . ANTERIOR CERVICAL DECOMP/DISCECTOMY FUSION  N/A 06/24/2014   Procedure: Cervical five-six anterior cervical decompression with fusion interbody prosthesis with plating and bonegraft;  Surgeon: Consuella Lose, MD;  Location: [facility];  Service: Neurosurgery;  Laterality: N/A;   Cervical five-six anterior cervical decompression with fusion interbody prosthesis with plating and bonegraft  . APPENDECTOMY  2004  . APPENDECTOMY    . CARDIAC CATHETERIZATION     Dr. Verl Blalock ([place])  . CERVICAL SPINE SURGERY    . CHOLECYSTECTOMY  2004  . CHOLECYSTECTOMY    . COLONOSCOPY N/A 09/08/2012   Procedure: COLONOSCOPY;  Surgeon: Leighton Ruff, MD;  Location: [facility];  Service: Endoscopy;  Laterality: N/A;  . EYE SURGERY  1970  . EYE SURGERY    . HERNIA REPAIR    . KNEE CARTILAGE SURGERY    . LEG SURGERY    . SKIN GRAFT  1974  . SKIN GRAFT FULL THICKNESS LEG    . STRABISMUS SURGERY         Home Medications    Prior to Admission medications   Medication Sig Start Date End Date Taking? Authorizing Provider  aspirin 81 MG tablet Take 81 mg by mouth daily.    Historical Provider, MD  aspirin EC 81 MG tablet Take 1 tablet (81 mg total) by mouth daily. 07/04/14   Consuella Lose, MD  atorvastatin (LIPITOR) 20 MG tablet Take 20 mg by mouth daily.    Historical Provider, MD  ATORVASTATIN CALCIUM PO Take by mouth.    Historical Provider, MD  buPROPion (WELLBUTRIN XL) 150 MG 24 hr tablet Take 150 mg by mouth daily.    Historical Provider, MD  Dulaglutide (TRULICITY [place]) Inject into the skin.    Historical Provider, MD  gabapentin (NEURONTIN) 300 MG capsule Take 600 mg by mouth 2 (two) times daily.    Historical Provider, MD  GABAPENTIN PO Take by mouth.    Historical Provider, MD  ibuprofen (ADVIL,MOTRIN) 200 MG tablet Take 400 mg by mouth 2 (two) times daily as needed (pain).    Historical Provider, MD  insulin aspart (NOVOLOG FLEXPEN) 100 UNIT/ML FlexPen Inject 20 Units into the skin daily before supper.    Historical Provider, MD  Insulin Glargine (LANTUS SOLOSTAR) 100 UNIT/ML Solostar Pen Inject 50 Units into the skin 2 (two) times daily.    Historical Provider, MD  lisinopril (PRINIVIL,ZESTRIL) 40 MG tablet Take 40 mg by mouth daily.    Historical Provider, MD    lisinopril-hydrochlorothiazide (PRINZIDE,ZESTORETIC) 20-12.5 MG per tablet Take 1 tablet by mouth daily. *Patient MUST MAKE APPOINTMENT* 03/07/14   Pixie Casino, MD  [facility] VERIO test strip 1 strip by Does not apply route 3 (three) times daily. 06/13/14   Historical Provider, MD    Family History Family History  Problem Relation Age of Onset  . Diabetes Mother   . Heart attack Mother   . Stroke Mother   . Diabetes Father   . Heart disease Other     Parent  . Heart disease Other     Grandparents    Social History Social History  Substance Use Topics  . Smoking status: Former Smoker    Years: 4.00    Types: Cigarettes  . Smokeless tobacco: Former Systems developer  . Alcohol use No     Allergies   Byetta 10 mcg pen [exenatide]; Codeine; Codeine; Invokana [canagliflozin]; and Victoza [liraglutide]   Review of Systems Review of Systems  Constitutional: Negative for chills and fever.  Respiratory: Negative for shortness of breath.   Cardiovascular:  Negative for chest pain.  Gastrointestinal: Positive for abdominal distention, abdominal pain and nausea. Negative for blood in stool, constipation, diarrhea, flatus, hematochezia, melena and vomiting.  Genitourinary: Negative for discharge, dysuria, hematuria, scrotal swelling and testicular pain.  Musculoskeletal: Negative for arthralgias and myalgias.  Skin: Negative for color change.  Allergic/Immunologic: Positive for immunocompromised state (DM2).  Neurological: Negative for weakness and numbness.  Psychiatric/Behavioral: Negative for confusion.   10 Systems reviewed and are negative for acute change except as noted in the HPI.   Physical Exam Updated Vital Signs BP 144/86 (BP Location: Left Arm)   Pulse 77   Temp 98 F (36.7 C) (Oral)   Resp 17   Ht 5\' 5"  (1.651 m)   Wt 87.1 kg   SpO2 97%   BMI 31.95 kg/m   Physical Exam  Constitutional: He is oriented to person, place, and time. Vital signs are normal. He appears  well-developed and well-nourished.  Non-toxic appearance. No distress.  Afebrile, nontoxic, NAD  HENT:  Head: Normocephalic and atraumatic.  Mouth/Throat: Oropharynx is clear and moist and mucous membranes are normal.  Eyes: Conjunctivae and EOM are normal. Right eye exhibits no discharge. Left eye exhibits no discharge.  Neck: Normal range of motion. Neck supple.  Cardiovascular: Normal rate, regular rhythm, normal heart sounds and intact distal pulses.  Exam reveals no gallop and no friction rub.   No murmur heard. Pulmonary/Chest: Effort normal and breath sounds normal. No respiratory distress. He has no decreased breath sounds. He has no wheezes. He has no rhonchi. He has no rales.  Abdominal: Soft. Normal appearance. He exhibits distension. Bowel sounds are decreased. There is tenderness in the periumbilical area. There is guarding. There is no rigidity, no rebound, no CVA tenderness, no tenderness at McBurney's point and negative Murphy's sign. A hernia is present.    Soft, mildly distended, hypoactive bowel sounds throughout, with moderate lower abd TTP diffusely across lower abd but most focally in the periumbilical area, +umbilical hernia which is partially reducible however it immediately prolapses back out once pressure is lifted, with some faint bowel sounds heard within the umbilical area; unable to fully or completely reduce due to pain; mild guarding, no rebound or rigidity, neg murphy's, neg mcburney's, no CVA TTP   Musculoskeletal: Normal range of motion.  Neurological: He is alert and oriented to person, place, and time. He has normal strength. No sensory deficit.  Skin: Skin is warm, dry and intact. No rash noted.  Psychiatric: He has a normal mood and affect.  Nursing note and vitals reviewed.    ED Treatments / Results  Labs (all labs ordered are listed, but only abnormal results are displayed) Labs Reviewed  COMPREHENSIVE METABOLIC PANEL - Abnormal; Notable for the  following:       Result Value   Glucose, Bld 163 (*)    All other components within normal limits  URINALYSIS, ROUTINE W REFLEX MICROSCOPIC - Abnormal; Notable for the following:    Glucose, UA 150 (*)    All other components within normal limits  CBC WITH DIFFERENTIAL/PLATELET  LIPASE, BLOOD    EKG  EKG Interpretation None       Radiology Ct Abdomen Pelvis W Contrast  Result Date: 09/30/2016 CLINICAL DATA:  Umbilical hernia.  Pain. EXAM: CT ABDOMEN AND PELVIS WITH CONTRAST TECHNIQUE: Multidetector CT imaging of the abdomen and pelvis was performed using the standard protocol following bolus administration of intravenous contrast. CONTRAST:  172mL ISOVUE-300 IOPAMIDOL (ISOVUE-300) INJECTION 61% COMPARISON:  08/21/2016  FINDINGS: Lower chest: Lung bases are clear. Pericardial calcification most severe along the left ventricle as can be seen with the sequela prior pericarditis. Hepatobiliary: No focal liver abnormality is seen. Diffuse low attenuation of the liver as can be seen with hepatic steatosis. No biliary dilatation. Pancreas: Unremarkable. No pancreatic ductal dilatation or surrounding inflammatory changes. Spleen: Normal in size without focal abnormality. Adrenals/Urinary Tract: Normal adrenal glands. No urolithiasis or obstructive uropathy. 3.8 x 3.8 cm hypodense, fluid attenuating right inferior pole renal mass most consistent with a cyst. Normal bladder. Stomach/Bowel: No bowel wall thickening or bowel dilatation. No pneumatosis, pneumoperitoneum or portal venous gas. No abdominal or pelvic free fluid. Diverticulosis without evidence of diverticulitis. Prior appendectomy. Vascular/Lymphatic: Normal caliber abdominal aorta. No lymphadenopathy. Reproductive: Prostate is unremarkable. Other: Small fat containing umbilical hernia. No other abdominal wall hernia. Small fact containing right inguinal hernia. Musculoskeletal: No acute osseous abnormality. No lytic or sclerotic osseous lesion.  Degenerative disc disease with disc height loss at L5-S1 with a broad-based disc osteophyte complex. IMPRESSION: 1. No acute abdominal or pelvic pathology. 2. Small fat containing umbilical hernia. 3. Pericardial calcification as can be seen with the sequela of pericarditis. If there is clinical concern regarding constrictive pericarditis, recommend echocardiography. Electronically Signed   By: Kathreen Devoid   On: 09/30/2016 13:50    Procedures Procedures (including critical care time)  Medications Ordered in ED Medications  sodium chloride 0.9 % bolus 1,000 mL (1,000 mLs Intravenous New Bag/Given 09/30/16 1225)  ondansetron (ZOFRAN) injection 4 mg (4 mg Intravenous Given 09/30/16 1223)  ketorolac (TORADOL) 30 MG/ML injection 15 mg (15 mg Intravenous Given 09/30/16 1223)  iopamidol (ISOVUE-300) 61 % injection (100 mLs  Contrast Given 09/30/16 1318)     Initial Impression / Assessment and Plan / ED Course  I have reviewed the triage vital signs and the nursing notes.  Pertinent labs & imaging results that were available during my care of the patient were reviewed by me and considered in my medical decision making (see chart for details).     51 y.o. male here with periumbilical pain that began 2 days ago; diagnosed with umbilical hernia 1.5 months ago and has had some pain since then, but much worse 2 days ago; also with nausea. On exam, abd mildly distended, hypoactive bowel sounds, with umbilical hernia noted and reduces but then immediately prolapses back out, +bowel sounds present over the herniated region, unable to completely reduce fully secondary to pain. Given concern for possibly partially incarcerated hernia, and/or obstruction, will proceed with CT scan and labs. Will give toradol for pain since pt is driving, and see if this helps adequately; if not then may consider switching to narcotic pain meds. Will reassess shortly  2:34 PM CBC w/diff unremarkable. CMP with gluc 163 however  otherwise unremarkable. Lipase WNL. U/A unremarkable. CT showing small fat containing umbilical hernia, no other acute findings; this is reassuring that the area that continues to prolapse out from the hernia is NOT bowel, but instead is just fat. Pt feeling much better, tolerating PO well. Advised that his hernia is likely the cause of his pain, however no evidence of incarceration or obstruction which is reassuring, and thus this is a nonurgent elective surgical issue. Will have him f/up with surgery for ongoing management of this hernia. Advised adequate hydration and fiber in diet to avoid any straining/constipation that could make matters worse by causing him to strain; also advised s/sx of incarcerated hernia/obstruction, and strict return precautions for this  were advised; discussed tylenol/motrin use for pain. Rx zofran given. F/up with surgery in 1-2wks for ongoing management. I explained the diagnosis and have given explicit precautions to return to the ER including for any other new or worsening symptoms. The patient understands and accepts the medical plan as it's been dictated and I have answered their questions. Discharge instructions concerning home care and prescriptions have been given. The patient is STABLE and is discharged to home in good condition.   Final Clinical Impressions(s) / ED Diagnoses   Final diagnoses:  Umbilical hernia without obstruction and without gangrene  Periumbilical abdominal pain  Nausea    New Prescriptions New Prescriptions   ONDANSETRON (ZOFRAN ODT) 4 MG DISINTEGRATING TABLET    Take 1 tablet (4 mg total) by mouth every 8 (eight) hours as needed for nausea or vomiting.     [address] PA-C 09/30/16 [place], MD 09/30/16 2027

## 2016-09-30 NOTE — Discharge Instructions (Signed)
Your work up today has been reassuring. Your pain is likely from the hernia in your belly button, however there is no evidence of your bowels being blocked, and the hernia only contains fat. Stay hydrated with plenty of water, and increase the fiber in your diet in order to have soft stools so that you can avoid straining which can make the hernia protrude more. Take zofran as directed as needed for nausea. Alternate between motrin and tylenol as needed for pain. Follow up with the surgeon in the next 1-2 weeks for ongoing management of your abdominal hernia. Return to the ER for emergent changes or worsening symptoms.

## 2016-09-30 NOTE — ED Notes (Signed)
Pt ambulatory to restroom with steady gait.

## 2016-09-30 NOTE — ED Notes (Signed)
Patient transported to CT 

## 2016-09-30 NOTE — ED Notes (Signed)
ED Provider at bedside. 

## 2016-09-30 NOTE — ED Notes (Signed)
Pt returned from CT °

## 2017-01-11 DIAGNOSIS — E1065 Type 1 diabetes mellitus with hyperglycemia: Secondary | ICD-10-CM | POA: Diagnosis not present

## 2017-02-16 DIAGNOSIS — R072 Precordial pain: Secondary | ICD-10-CM | POA: Diagnosis not present

## 2017-02-16 DIAGNOSIS — I517 Cardiomegaly: Secondary | ICD-10-CM | POA: Diagnosis not present

## 2017-02-16 DIAGNOSIS — R61 Generalized hyperhidrosis: Secondary | ICD-10-CM | POA: Diagnosis not present

## 2017-02-16 DIAGNOSIS — R0602 Shortness of breath: Secondary | ICD-10-CM | POA: Diagnosis not present

## 2017-02-16 DIAGNOSIS — R079 Chest pain, unspecified: Secondary | ICD-10-CM | POA: Diagnosis not present

## 2017-02-16 DIAGNOSIS — R110 Nausea: Secondary | ICD-10-CM | POA: Diagnosis not present

## 2017-02-18 DIAGNOSIS — R079 Chest pain, unspecified: Secondary | ICD-10-CM | POA: Diagnosis not present

## 2017-02-19 DIAGNOSIS — Z1389 Encounter for screening for other disorder: Secondary | ICD-10-CM | POA: Diagnosis not present

## 2017-02-19 DIAGNOSIS — E784 Other hyperlipidemia: Secondary | ICD-10-CM | POA: Diagnosis not present

## 2017-02-19 DIAGNOSIS — Z6831 Body mass index (BMI) 31.0-31.9, adult: Secondary | ICD-10-CM | POA: Diagnosis not present

## 2017-02-19 DIAGNOSIS — R079 Chest pain, unspecified: Secondary | ICD-10-CM | POA: Diagnosis not present

## 2017-02-19 DIAGNOSIS — E1065 Type 1 diabetes mellitus with hyperglycemia: Secondary | ICD-10-CM | POA: Diagnosis not present

## 2017-03-03 ENCOUNTER — Encounter: Payer: Self-pay | Admitting: *Deleted

## 2017-03-20 ENCOUNTER — Encounter: Payer: Self-pay | Admitting: Cardiovascular Disease

## 2017-03-20 ENCOUNTER — Ambulatory Visit (INDEPENDENT_AMBULATORY_CARE_PROVIDER_SITE_OTHER): Payer: BLUE CROSS/BLUE SHIELD | Admitting: Cardiovascular Disease

## 2017-03-20 VITALS — BP 144/90 | HR 94 | Ht 65.0 in | Wt 194.0 lb

## 2017-03-20 DIAGNOSIS — E7849 Other hyperlipidemia: Secondary | ICD-10-CM

## 2017-03-20 DIAGNOSIS — E784 Other hyperlipidemia: Secondary | ICD-10-CM

## 2017-03-20 DIAGNOSIS — R079 Chest pain, unspecified: Secondary | ICD-10-CM | POA: Diagnosis not present

## 2017-03-20 DIAGNOSIS — R0600 Dyspnea, unspecified: Secondary | ICD-10-CM | POA: Diagnosis not present

## 2017-03-20 LAB — COMPREHENSIVE METABOLIC PANEL
A/G RATIO: 1.7 (ref 1.2–2.2)
ALBUMIN: 4.4 g/dL (ref 3.5–5.5)
ALT: 28 IU/L (ref 0–44)
AST: 18 IU/L (ref 0–40)
Alkaline Phosphatase: 118 IU/L — ABNORMAL HIGH (ref 39–117)
BUN / CREAT RATIO: 7 — AB (ref 9–20)
BUN: 8 mg/dL (ref 6–24)
Bilirubin Total: 0.4 mg/dL (ref 0.0–1.2)
CALCIUM: 9.8 mg/dL (ref 8.7–10.2)
CO2: 18 mmol/L — AB (ref 20–29)
CREATININE: 1.07 mg/dL (ref 0.76–1.27)
Chloride: 101 mmol/L (ref 96–106)
GFR, EST AFRICAN AMERICAN: 92 mL/min/{1.73_m2} (ref >59)
GFR, EST NON AFRICAN AMERICAN: 80 mL/min/{1.73_m2} (ref >59)
GLOBULIN, TOTAL: 2.6 g/dL (ref 1.5–4.5)
Glucose: 129 mg/dL — ABNORMAL HIGH (ref 65–99)
POTASSIUM: 4 mmol/L (ref 3.5–5.2)
SODIUM: 139 mmol/L (ref 134–144)
Total Protein: 7 g/dL (ref 6.0–8.5)

## 2017-03-20 LAB — LIPID PANEL
CHOL/HDL RATIO: 3.3 ratio (ref 0.0–5.0)
Cholesterol, Total: 101 mg/dL (ref 100–199)
HDL: 31 mg/dL — ABNORMAL LOW (ref >39)
LDL CALC: 45 mg/dL (ref 0–99)
TRIGLYCERIDES: 126 mg/dL (ref 0–149)
VLDL Cholesterol Cal: 25 mg/dL (ref 5–40)

## 2017-03-20 NOTE — Progress Notes (Signed)
Cardiology Office Note Date:  03/20/2017   ID:  Levi Meyers, Levi Meyers September 25, 1965, MRN [id number]  PCP:  Sharilyn Sites, MD  Cardiologist:  Sherren Mocha, MD    Chief Complaint  Patient presents with  . New Patient (Initial Visit)     History of Present Illness: DSEAN Meyers is a 51 y.o. male who presents for evaluation of chest pain. The patient has a history of type 2 diabetes, hyperlipidemia, and hypertension.  The patient is here with his wife today. She is a Marine scientist here in our office. He was evaluated in the emergency department approximately one month ago for chest pain. Troponins were negative. He underwent a CTA of the chest which demonstrated no evidence of pulmonary embolism. An outpatient stress test is recommended. He presents here today for further evaluation.  He complains of shortness of breath with exertion, progressive over the past year. Also has mild pressure in the chest when he does strenuous work. This resolves with rest of 15-20 minutes. He denies resting symptoms. The day of his ER evaluation he had sudden onset of severe left-sided chest pain down the left arm. These severe symptoms have not recurred.   The patient has had Type II DM x 8-9 years, treated with insulin. Also has HTN and hyperlipidemia treated with a statin drug. He is a former smoker but quit many years ago. His mother had CABG twice, first diagnosed in her late 45's. His father had severe CAD diagnosed at 24 and he died of heart disease.   The patient had a chest pain episode in 2009 and was evaluated with cardiac catheterization. This demonstrated minor nonobstructive coronary artery disease with 30% stenosis in the LAD.  Past Medical History:  Diagnosis Date  . Asthma   . Depression   . Diabetes mellitus   . Diabetes mellitus without complication ([place])   . GERD (gastroesophageal reflux disease)   . High cholesterol   . History of kidney stones   . Hyperlipidemia   . Hypertension   . Kidney  disease    stones  . Kidney stone   . Noncompliance with diabetes treatment   . Sleep apnea    cpap    Past Surgical History:  Procedure Laterality Date  . ANTERIOR CERVICAL DECOMP/DISCECTOMY FUSION N/A 06/24/2014   Procedure: Cervical five-six anterior cervical decompression with fusion interbody prosthesis with plating and bonegraft;  Surgeon: Consuella Lose, MD;  Location: [facility];  Service: Neurosurgery;  Laterality: N/A;  Cervical five-six anterior cervical decompression with fusion interbody prosthesis with plating and bonegraft  . APPENDECTOMY  2004  . APPENDECTOMY    . CARDIAC CATHETERIZATION     Dr. Verl Blalock ([place])  . CERVICAL SPINE SURGERY    . CHOLECYSTECTOMY  2004  . CHOLECYSTECTOMY    . COLONOSCOPY N/A 09/08/2012   Procedure: COLONOSCOPY;  Surgeon: Leighton Ruff, MD;  Location: [facility];  Service: Endoscopy;  Laterality: N/A;  . EYE SURGERY  1970  . EYE SURGERY    . HERNIA REPAIR    . KNEE CARTILAGE SURGERY    . LEG SURGERY    . SKIN GRAFT  1974  . SKIN GRAFT FULL THICKNESS LEG    . STRABISMUS SURGERY      Current Outpatient Prescriptions  Medication Sig Dispense Refill  . aspirin 81 MG tablet Take 81 mg by mouth daily.    Marland Kitchen atorvastatin (LIPITOR) 20 MG tablet Take 20 mg by mouth daily.    Marland Kitchen buPROPion (WELLBUTRIN XL)  150 MG 24 hr tablet Take 150 mg by mouth daily.    . Dulaglutide (TRULICITY Stewart) Inject 1.5 mg into the skin once a week.     . gabapentin (NEURONTIN) 300 MG capsule Take 600 mg by mouth 2 (two) times daily.    Marland Kitchen ibuprofen (ADVIL,MOTRIN) 200 MG tablet Take 400 mg by mouth 2 (two) times daily as needed (pain).    . insulin aspart (NOVOLOG FLEXPEN) 100 UNIT/ML FlexPen Inject 20 Units into the skin daily before supper.    . Insulin Glargine (LANTUS SOLOSTAR) 100 UNIT/ML Solostar Pen Inject 60 Units into the skin 2 (two) times daily.     Marland Kitchen lisinopril (PRINIVIL,ZESTRIL) 5 MG tablet Take 5 mg by mouth daily.     No current facility-administered  medications for this visit.     Allergies:   Byetta 10 mcg pen [exenatide]; Codeine; Codeine; Invokana [canagliflozin]; and Victoza [liraglutide]   Social History:  The patient  reports that he has quit smoking. His smoking use included Cigarettes. He quit after 4.00 years of use. He has quit using smokeless tobacco. He reports that he does not drink alcohol or use drugs.   Family History:  The patient's  family history includes Diabetes in his father and mother; Heart attack in his mother; Heart disease in his other and other; Stroke in his mother.   ROS:  Please see the history of present illness.  Otherwise, review of systems is positive for Fatigue.  All other systems are reviewed and negative.    PHYSICAL EXAM: VS:  BP (!) 144/90   Pulse 94   Ht '5\' 5"'$  (1.651 m)   Wt 194 lb (88 kg)   BMI 32.28 kg/m  , BMI Body mass index is 32.28 kg/m. GEN: Well nourished, well developed, in no acute distress  HEENT: normal  Neck: no JVD, no masses. No carotid bruits Cardiac: RRR without murmur or gallop                Respiratory:  clear to auscultation bilaterally, normal work of breathing GI: soft, nontender, nondistended, + BS MS: no deformity or atrophy  Ext: no pretibial edema, pedal pulses 2+= bilaterally Skin: warm and dry, no rash Neuro:  Strength and sensation are intact Psych: euthymic mood, full affect  EKG:  EKG is ordered today. The ekg ordered today shows normal sinus rhythm 97 bpm, nonspecific T-wave modality.  Recent Labs: 09/30/2016: Hemoglobin 14.8; Platelets 296 03/20/2017: ALT 28; BUN 8; Creatinine, Ser 1.07; Potassium 4.0; Sodium 139   Lipid Panel     Component Value Date/Time   CHOL 101 03/20/2017 0836   TRIG 126 03/20/2017 0836   HDL 31 (L) 03/20/2017 0836   CHOLHDL 3.3 03/20/2017 0836   CHOLHDL 8.5 09/12/2008 0230   VLDL UNABLE TO CALCULATE IF TRIGLYCERIDE OVER 400 mg/dL 09/12/2008 0230   LDLCALC 45 03/20/2017 0836      Wt Readings from Last 3 Encounters:   03/20/17 194 lb (88 kg)  09/30/16 192 lb (87.1 kg)  12/29/15 196 lb (88.9 kg)     Cardiac Studies Reviewed: Data from his recent emergency room visit is reviewed. Also previous heart catheterization from 2009 is reviewed as outlined below:                            [place] PROVIDER:  [facility] [place]   CARDIOLOGIST:  Marijo Conception. Wall, MD, [facility]   CLINICAL  HISTORY:  Mr. Alfieri is 51 years old and has no prior history of  known heart disease, although he does have diabetes, hypertension, and  hyperlipidemia.  He had an accident related to hypoglycemia and has not  been able to drive and lost his job about a year ago.  Recently, he  developed symptoms of chest tightness and was seen in consultation by  Dr. Jenell Milliner and was scheduled for catheterization today.   PROCEDURE:  The procedure was performed via the right femoral arterial  sheath, and 5-French Performa coronary catheters.  A front wall arterial  puncture was performed and Omnipaque contrast was used.  The patient  tolerated the procedure well and left the laboratory in satisfactory  condition.   RESULTS:  Left main coronary artery:  The left main coronary artery is  free of disease.   Left anterior descending artery:  The left anterior descending artery  was irregular mainly in its proximal portion and there was 30% proximal  narrowing.  This was after the first diagonal branch.  The LAD gave rise  to two diagonal branches and several septal perforators.   Circumflex artery:  The circumflex artery gave rise to a small marginal  branch, atrial branch, and a posterolateral branch.  There was 30%  ostial stenosis in the circumflex artery but no other significant  obstruction.   Right coronary artery:  The right coronary artery was a moderately large  vessel gave rise to a conus branch, 2 right ventricular branch,  posterior descending branch, and posterolateral branch.   There are  irregularities in this vessel, but no significant obstruction.   Left ventriculogram:  The left ventriculogram performed in the RAO  projection showed  vigorous wall motion with no areas of hypokinesis.  The estimated fraction was 65%.   The aortic pressure was 112/78 with mean of 94 and left neck pressure  was 112/23.   CONCLUSION:  Mild nonobstructive coronary artery disease with 30%  narrowing in the proximal LAD, 30% ostial stenosis in the circumflex  artery, irregularities in the right coronary, and normal LV function.   RECOMMENDATIONS:  In view of the these finding, I think the patient's  recent symptoms are not likely ischemic.  We will check a D-dimer and  decide about further evaluation of his chest discomfort.   ASSESSMENT AND PLAN: 1.  Shortness of breath with exertion: Cardiac exam is normal. Remote smoking history noted. No evidence of congestive heart failure by symptoms or exam. I recommended check an echocardiogram for assessment of LV function and valvular disease.  2. Exertional chest pain: Symptoms are concerning for angina in this patient with long-standing diabetes. He also has a strong family history of coronary artery disease as detailed above. He does not think he can walk on a treadmill for stress testing. I have recommended a gated coronary CTA with FFR. His previous cardiac catheterization demonstrated mild nonobstructive coronary artery disease but this was performed nearly 10 years ago.  3. Type 2 diabetes: Followed by endocrinology. Treated with insulin. Most recent office evaluation and lab work is reviewed. He is counseled regarding diet and lifestyle modification.  4. Hypertension: Patient is appropriately treated with an ACE inhibitor and diuretic.  Current medicines are reviewed with the patient today.  The patient does not have concerns regarding medicines.  Labs/ tests ordered today include:   Orders Placed This Encounter    Procedures  . CT CORONARY MORPH W/CTA COR W/SCORE W/CA W/CM &/OR WO/CM  .  CT CORONARY FRACTIONAL FLOW RESERVE DATA PREP  . CT CORONARY FRACTIONAL FLOW RESERVE FLUID ANALYSIS  . Comp Met (CMET)  . Lipid Profile  . EKG 12-Lead  . ECHOCARDIOGRAM COMPLETE    Disposition:   FU one year unless testing is abnormal  Signed, Sherren Mocha, MD  03/20/2017 [facility] [place], [place], [place]  [address] Phone: [phone]; Fax: [phone]

## 2017-03-20 NOTE — Patient Instructions (Signed)
Medication Instructions:  Your physician recommends that you continue on your current medications as directed. Please refer to the Current Medication list given to you today.   Labwork: Lab work to be done today--CMET and Lipid profile  Testing/Procedures: Your physician has requested that you have an echocardiogram. Echocardiography is a painless test that uses sound waves to create images of your heart. It provides your doctor with information about the size and shape of your heart and how well your heart's chambers and valves are working. This procedure takes approximately one hour. There are no restrictions for this procedure.  Your physician has requested that you have cardiac CT. Cardiac computed tomography (CT) is a painless test that uses an x-ray machine to take clear, detailed pictures of your heart. For further information please visit [email or web address]. Please follow instruction sheet as given.     Follow-Up: Your physician wants you to follow-up in: 12 months.  You will receive a reminder letter in the mail two months in advance. If you don't receive a letter, please call our office to schedule the follow-up appointment.     Any Other Special Instructions Will Be Listed Below (If Applicable).     If you need a refill on your cardiac medications before your next appointment, please call your pharmacy.

## 2017-03-21 ENCOUNTER — Ambulatory Visit (HOSPITAL_COMMUNITY): Payer: BLUE CROSS/BLUE SHIELD | Attending: Cardiovascular Disease

## 2017-03-21 ENCOUNTER — Other Ambulatory Visit: Payer: Self-pay

## 2017-03-21 DIAGNOSIS — R0600 Dyspnea, unspecified: Secondary | ICD-10-CM | POA: Insufficient documentation

## 2017-03-21 DIAGNOSIS — R079 Chest pain, unspecified: Secondary | ICD-10-CM

## 2017-03-31 DIAGNOSIS — E1165 Type 2 diabetes mellitus with hyperglycemia: Secondary | ICD-10-CM | POA: Diagnosis not present

## 2017-03-31 DIAGNOSIS — E1140 Type 2 diabetes mellitus with diabetic neuropathy, unspecified: Secondary | ICD-10-CM | POA: Diagnosis not present

## 2017-04-15 ENCOUNTER — Encounter: Payer: Self-pay | Admitting: "Endocrinology

## 2017-06-18 ENCOUNTER — Telehealth: Payer: Self-pay

## 2017-06-18 NOTE — Telephone Encounter (Signed)
Message received from [facility]: FW: 06/18/17 Pat, Per the patient's wife Anderson Malta) they will call when ready to schedule,referral canceled.  Thanks

## 2017-09-29 DIAGNOSIS — E1140 Type 2 diabetes mellitus with diabetic neuropathy, unspecified: Secondary | ICD-10-CM | POA: Diagnosis not present

## 2017-09-29 DIAGNOSIS — E1165 Type 2 diabetes mellitus with hyperglycemia: Secondary | ICD-10-CM | POA: Diagnosis not present

## 2017-11-22 ENCOUNTER — Emergency Department (HOSPITAL_BASED_OUTPATIENT_CLINIC_OR_DEPARTMENT_OTHER): Payer: BLUE CROSS/BLUE SHIELD

## 2017-11-22 ENCOUNTER — Encounter (HOSPITAL_BASED_OUTPATIENT_CLINIC_OR_DEPARTMENT_OTHER): Payer: Self-pay

## 2017-11-22 ENCOUNTER — Emergency Department (HOSPITAL_BASED_OUTPATIENT_CLINIC_OR_DEPARTMENT_OTHER)
Admission: EM | Admit: 2017-11-22 | Discharge: 2017-11-22 | Disposition: A | Discharge status: Home / Self Care | Payer: BLUE CROSS/BLUE SHIELD | Attending: Emergency Medicine | Admitting: Emergency Medicine

## 2017-11-22 ENCOUNTER — Other Ambulatory Visit: Payer: Self-pay

## 2017-11-22 DIAGNOSIS — N3090 Cystitis, unspecified without hematuria: Secondary | ICD-10-CM | POA: Diagnosis not present

## 2017-11-22 DIAGNOSIS — Z7982 Long term (current) use of aspirin: Secondary | ICD-10-CM | POA: Diagnosis not present

## 2017-11-22 DIAGNOSIS — R300 Dysuria: Secondary | ICD-10-CM | POA: Diagnosis not present

## 2017-11-22 DIAGNOSIS — I10 Essential (primary) hypertension: Secondary | ICD-10-CM | POA: Diagnosis not present

## 2017-11-22 DIAGNOSIS — Z87891 Personal history of nicotine dependence: Secondary | ICD-10-CM | POA: Diagnosis not present

## 2017-11-22 DIAGNOSIS — Z79899 Other long term (current) drug therapy: Secondary | ICD-10-CM | POA: Diagnosis not present

## 2017-11-22 DIAGNOSIS — Z794 Long term (current) use of insulin: Secondary | ICD-10-CM | POA: Diagnosis not present

## 2017-11-22 DIAGNOSIS — N3091 Cystitis, unspecified with hematuria: Secondary | ICD-10-CM | POA: Diagnosis not present

## 2017-11-22 DIAGNOSIS — J45909 Unspecified asthma, uncomplicated: Secondary | ICD-10-CM | POA: Insufficient documentation

## 2017-11-22 DIAGNOSIS — K760 Fatty (change of) liver, not elsewhere classified: Secondary | ICD-10-CM | POA: Diagnosis not present

## 2017-11-22 DIAGNOSIS — E119 Type 2 diabetes mellitus without complications: Secondary | ICD-10-CM | POA: Diagnosis not present

## 2017-11-22 LAB — URINALYSIS, ROUTINE W REFLEX MICROSCOPIC

## 2017-11-22 LAB — BASIC METABOLIC PANEL
ANION GAP: 9 (ref 5–15)
BUN: 11 mg/dL (ref 6–20)
CALCIUM: 9 mg/dL (ref 8.9–10.3)
CO2: 22 mmol/L (ref 22–32)
Chloride: 104 mmol/L (ref 101–111)
Creatinine, Ser: 1.09 mg/dL (ref 0.61–1.24)
Glucose, Bld: 258 mg/dL — ABNORMAL HIGH (ref 65–99)
POTASSIUM: 3.9 mmol/L (ref 3.5–5.1)
SODIUM: 135 mmol/L (ref 135–145)

## 2017-11-22 LAB — CBC WITH DIFFERENTIAL/PLATELET
BASOS ABS: 0 10*3/uL (ref 0.0–0.1)
BASOS PCT: 0 %
Eosinophils Absolute: 0.2 10*3/uL (ref 0.0–0.7)
Eosinophils Relative: 1 %
HEMATOCRIT: 45 % (ref 39.0–52.0)
Hemoglobin: 15.7 g/dL (ref 13.0–17.0)
LYMPHS PCT: 18 %
Lymphs Abs: 3.2 10*3/uL (ref 0.7–4.0)
MCH: 30.8 pg (ref 26.0–34.0)
MCHC: 34.9 g/dL (ref 30.0–36.0)
MCV: 88.2 fL (ref 78.0–100.0)
MONO ABS: 1.1 10*3/uL — AB (ref 0.1–1.0)
Monocytes Relative: 7 %
NEUTROS ABS: 12.7 10*3/uL — AB (ref 1.7–7.7)
Neutrophils Relative %: 74 %
Platelets: 299 10*3/uL (ref 150–400)
RBC: 5.1 MIL/uL (ref 4.22–5.81)
RDW: 13.3 % (ref 11.5–15.5)
WBC: 17.2 10*3/uL — AB (ref 4.0–10.5)

## 2017-11-22 LAB — URINALYSIS, MICROSCOPIC (REFLEX)
RBC / HPF: >50 RBC/hpf (ref 0–5)
WBC, UA: >50 WBC/hpf (ref 0–5)

## 2017-11-22 MED ORDER — OXYCODONE-ACETAMINOPHEN 5-325 MG PO TABS: 2.0000 | ORAL_TABLET | ORAL | 0 refills | Status: DC | PRN

## 2017-11-22 MED ORDER — KETOROLAC TROMETHAMINE 30 MG/ML IJ SOLN
30.0000 mg | Freq: Once | INTRAMUSCULAR | Status: AC
  Administered 2017-11-22: 30 mg via INTRAVENOUS
  Filled 2017-11-22: qty 1

## 2017-11-22 MED ORDER — ONDANSETRON HCL 4 MG/2ML IJ SOLN
4.0000 mg | Freq: Once | INTRAMUSCULAR | Status: AC
  Administered 2017-11-22: 4 mg via INTRAVENOUS
  Filled 2017-11-22: qty 2

## 2017-11-22 MED ORDER — SODIUM CHLORIDE 0.9 % IV SOLN
1.0000 g | Freq: Once | INTRAVENOUS | Status: AC
  Administered 2017-11-22: 1 g via INTRAVENOUS
  Filled 2017-11-22: qty 10

## 2017-11-22 MED ORDER — CEPHALEXIN 500 MG PO CAPS: 500.0000 mg | ORAL_CAPSULE | Freq: Four times a day (QID) | ORAL | 0 refills | Status: DC

## 2017-11-22 MED ORDER — SODIUM CHLORIDE 0.9 % IV BOLUS
1000.0000 mL | Freq: Once | INTRAVENOUS | Status: AC
  Administered 2017-11-22: 1000 mL via INTRAVENOUS

## 2017-11-22 MED ORDER — FENTANYL CITRATE (PF) 100 MCG/2ML IJ SOLN
25.0000 ug | Freq: Once | INTRAMUSCULAR | Status: AC
  Administered 2017-11-22: 25 ug via INTRAVENOUS
  Filled 2017-11-22: qty 2

## 2017-11-22 MED ORDER — MORPHINE SULFATE (PF) 4 MG/ML IV SOLN
4.0000 mg | Freq: Once | INTRAVENOUS | Status: AC
  Administered 2017-11-22: 4 mg via INTRAVENOUS
  Filled 2017-11-22: qty 1

## 2017-11-22 NOTE — Discharge Instructions (Addendum)
Take antibiotics as directed. Please take all of your antibiotics until finished.  Take Tyenol 1000 mg 3 times a day for pain.  He can take the pain medication for severe breakthrough pain.  As we discussed, follow-up with referred urologist for further evaluation.  Call their office and arrange for an appointment next week.  Return to the emergency department for any fever, worsening pain, difficulty urinating, testicular pain, swelling, any other worsening or concerning symptoms.

## 2017-11-22 NOTE — ED Triage Notes (Signed)
Pt reports left flank pain that began this morning. Associated dysuria, blood in urine.

## 2017-11-22 NOTE — ED Provider Notes (Signed)
[facility]ER[facility] DEPARTMENT Provider Note   CSN: [id number] Arrival date & time: 11/22/17  1036     History   Chief Complaint Chief Complaint  Patient presents with  . Flank Pain    HPI Levi Meyers is a 52 y.o. male who presents for evaluation of dysuria, hematuria, left-sided flank pain that began this morning.  Patient reports that over the last few days, he has noticed some intermittent dysuria.  Patient reports that today, he started having hematuria.  He does report some clots noted.  Patient states that his left flank pain began this morning.  He states that pain is worsened with movement.  He is also had some nausea.  He.  He states that current symptoms feel like his previous episodes of kidney stones.  Patient reports he has a long-standing history of kidney stones but has not had one in several years.  He states he has not followed up with a urologist in several years.  He states that he has had to have both lithotripsy and stents placed to remove his kidney stones.  Patient denies any fever, chest pain, difficulty breathing.  The history is provided by the patient.    Past Medical History:  Diagnosis Date  . Asthma   . Depression   . Diabetes mellitus   . Diabetes mellitus without complication ([place])   . GERD (gastroesophageal reflux disease)   . High cholesterol   . History of kidney stones   . Hyperlipidemia   . Hypertension   . Kidney disease    stones  . Kidney stone   . Noncompliance with diabetes treatment   . Sleep apnea    cpap    Patient Active Problem List   Diagnosis Date Noted  . Cervical spondylosis with radiculopathy 06/24/2014  . OSA on CPAP 02/18/2013  . DOE (dyspnea on exertion) 01/07/2013  . Tachycardia 01/07/2013  . Edema extremities 01/07/2013  . Bipolar affective disorder ([place]) 03/31/2012  . Type 1 diabetes mellitus with hypoglycemia ([place]) 11/22/2011  . Amblyopia 11/22/2011  . Bronchitis, acute 11/03/2011  . DIABETES  MELLITUS, TYPE I 04/28/2008  . HYPERLIPIDEMIA 04/28/2008  . RHEUMATIC FEVER 04/28/2008  . HYPERTENSION 04/28/2008  . RENAL CALCULUS, HX OF 04/28/2008  . CHICKENPOX, HX OF 04/28/2008    Past Surgical History:  Procedure Laterality Date  . ANTERIOR CERVICAL DECOMP/DISCECTOMY FUSION N/A 06/24/2014   Procedure: Cervical five-six anterior cervical decompression with fusion interbody prosthesis with plating and bonegraft;  Surgeon: Consuella Lose, MD;  Location: [facility];  Service: Neurosurgery;  Laterality: N/A;  Cervical five-six anterior cervical decompression with fusion interbody prosthesis with plating and bonegraft  . APPENDECTOMY  2004  . APPENDECTOMY    . CARDIAC CATHETERIZATION     Dr. Verl Blalock ([place])  . CERVICAL SPINE SURGERY    . CHOLECYSTECTOMY  2004  . CHOLECYSTECTOMY    . COLONOSCOPY N/A 09/08/2012   Procedure: COLONOSCOPY;  Surgeon: Leighton Ruff, MD;  Location: [facility];  Service: Endoscopy;  Laterality: N/A;  . EYE SURGERY  1970  . EYE SURGERY    . HERNIA REPAIR    . KNEE CARTILAGE SURGERY    . LEG SURGERY    . SKIN GRAFT  1974  . SKIN GRAFT FULL THICKNESS LEG    . STRABISMUS SURGERY          Home Medications    Prior to Admission medications   Medication Sig Start Date End Date Taking? Authorizing Provider  aspirin 81  MG tablet Take 81 mg by mouth daily.   Yes [provider]  atorvastatin (LIPITOR) 20 MG tablet Take 20 mg by mouth daily.   Yes [provider]  buPROPion (WELLBUTRIN XL) 150 MG 24 hr tablet Take 150 mg by mouth daily.   Yes [provider]  Dulaglutide (TRULICITY [place]) Inject 1.5 mg into the skin once a week.    Yes [provider]  gabapentin (NEURONTIN) 300 MG capsule Take 600 mg by mouth 2 (two) times daily.   Yes [provider]  ibuprofen (ADVIL,MOTRIN) 200 MG tablet Take 400 mg by mouth 2 (two) times daily as needed (pain).   Yes [provider]  insulin aspart (NOVOLOG FLEXPEN)  100 UNIT/ML FlexPen Inject 20 Units into the skin daily before supper.   Yes [provider]  Insulin Glargine (LANTUS SOLOSTAR) 100 UNIT/ML Solostar Pen Inject 60 Units into the skin 2 (two) times daily.    Yes [provider]  lisinopril (PRINIVIL,ZESTRIL) 5 MG tablet Take 5 mg by mouth daily.   Yes [provider]  cephALEXin (KEFLEX) 500 MG capsule Take 1 capsule (500 mg total) by mouth 4 (four) times daily. 11/22/17   Volanda Napoleon, PA-C  oxyCODONE-acetaminophen (PERCOCET/ROXICET) 5-325 MG tablet Take 2 tablets by mouth every 4 (four) hours as needed for severe pain. 11/22/17   Volanda Napoleon, PA-C    Family History Family History  Problem Relation Age of Onset  . Diabetes Mother   . Heart attack Mother   . Stroke Mother   . Diabetes Father   . Heart disease Other        Parent  . Heart disease Other        Grandparents    Social History Social History   Tobacco Use  . Smoking status: Former Smoker    Years: 4.00    Types: Cigarettes  . Smokeless tobacco: Former Network engineer Use Topics  . Alcohol use: No  . Drug use: No     Allergies   Byetta 10 mcg pen [exenatide]; Codeine; Codeine; Invokana [canagliflozin]; and Victoza [liraglutide]   Review of Systems Review of Systems  Constitutional: Negative for fever.  Respiratory: Negative for cough and shortness of breath.   Cardiovascular: Negative for chest pain.  Gastrointestinal: Positive for nausea. Negative for abdominal pain and vomiting.  Genitourinary: Positive for dysuria, flank pain and hematuria.  All other systems reviewed and are negative.    Physical Exam Updated Vital Signs BP 114/81 (BP Location: Left Arm)   Pulse 80   Temp 97.8 F (36.6 C) (Oral)   Resp 16   Ht 5\' 5"  (1.651 m)   Wt 88.5 kg (195 lb)   SpO2 94%   BMI 32.45 kg/m   Physical Exam  Constitutional: He is oriented to person, place, and time. He appears well-developed and well-nourished.  Appears  uncomfortable but no acute distress   HENT:  Head: Normocephalic and atraumatic.  Mouth/Throat: Oropharynx is clear and moist and mucous membranes are normal.  Eyes: Pupils are equal, round, and reactive to light. Conjunctivae, EOM and lids are normal.  Neck: Full passive range of motion without pain.  Cardiovascular: Normal rate, regular rhythm, normal heart sounds and normal pulses. Exam reveals no gallop and no friction rub.  No murmur heard. Pulmonary/Chest: Effort normal and breath sounds normal.  Abdominal: Soft. Normal appearance. There is no tenderness. There is CVA tenderness (Left). There is no rigidity and no guarding. Hernia confirmed  negative in the right inguinal area and confirmed negative in the left inguinal area.  Abdomen is soft, non-distended, non-tender. No rigidity, No guarding. No peritoneal signs. Left sided CVA tenderness.  Easily reducible umbilicus hernia with no surrounding warmth, erythema.  Genitourinary: Testes normal and penis normal. Right testis shows no swelling and no tenderness. Left testis shows no swelling and no tenderness. Circumcised. No penile tenderness.  Genitourinary Comments: The exam was performed with a chaperone present. Normal male genitalia. No evidence of rash, ulcers or lesions.  No tenderness, warmth, erythema noted to bilateral testicles.  Musculoskeletal: Normal range of motion.  Neurological: He is alert and oriented to person, place, and time.  Skin: Skin is warm and dry. Capillary refill takes less than 2 seconds.  Psychiatric: He has a normal mood and affect. His speech is normal.  Nursing note and vitals reviewed.    ED Treatments / Results  Labs (all labs ordered are listed, but only abnormal results are displayed) Labs Reviewed  BASIC METABOLIC PANEL - Abnormal; Notable for the following components:      Result Value   Glucose, Bld 258 (*)    All other components within normal limits  CBC WITH DIFFERENTIAL/PLATELET -  Abnormal; Notable for the following components:   WBC 17.2 (*)    Neutro Abs 12.7 (*)    Monocytes Absolute 1.1 (*)    All other components within normal limits  URINALYSIS, ROUTINE W REFLEX MICROSCOPIC - Abnormal; Notable for the following components:   Color, Urine BROWN (*)    APPearance TURBID (*)    Glucose, UA   (*)    Value: TEST NOT REPORTED DUE TO COLOR INTERFERENCE OF URINE PIGMENT   Hgb urine dipstick   (*)    Value: TEST NOT REPORTED DUE TO COLOR INTERFERENCE OF URINE PIGMENT   Bilirubin Urine   (*)    Value: TEST NOT REPORTED DUE TO COLOR INTERFERENCE OF URINE PIGMENT   Ketones, ur   (*)    Value: TEST NOT REPORTED DUE TO COLOR INTERFERENCE OF URINE PIGMENT   Protein, ur   (*)    Value: TEST NOT REPORTED DUE TO COLOR INTERFERENCE OF URINE PIGMENT   Nitrite   (*)    Value: TEST NOT REPORTED DUE TO COLOR INTERFERENCE OF URINE PIGMENT   Leukocytes, UA   (*)    Value: TEST NOT REPORTED DUE TO COLOR INTERFERENCE OF URINE PIGMENT   All other components within normal limits  URINALYSIS, MICROSCOPIC (REFLEX) - Abnormal; Notable for the following components:   Bacteria, UA MANY (*)    All other components within normal limits    EKG None  Radiology Ct Renal Stone Study  Result Date: 11/22/2017 CLINICAL DATA:  52 year old male with history of kidney stones. Left-sided flank pain for 1 day. Gross hematuria. Urinary frequency. EXAM: CT ABDOMEN AND PELVIS WITHOUT CONTRAST TECHNIQUE: Multidetector CT imaging of the abdomen and pelvis was performed following the standard protocol without IV contrast. COMPARISON:  CT the abdomen and pelvis 09/30/2016. FINDINGS: Lower chest: Severe pericardial calcification, most evident overlying the left ventricle and in the inferior aspect of the left atrioventricular groove. Hepatobiliary: Mild heterogeneously diffuse low attenuation throughout the hepatic parenchyma, indicative of hepatic steatosis. No definite cystic or solid hepatic lesions are  confidently on today's noncontrast CT examination. Status post cholecystectomy. Pancreas: No definite pancreatic mass or peripancreatic fluid or inflammatory changes are noted on today's noncontrast CT examination. Spleen: Unremarkable. Adrenals/Urinary Tract: Multiple tiny 2-3 mm nonobstructive  calculi are noted within the collecting systems of both kidneys. No ureteral or bladder calculi are noted. No hydroureteronephrosis. Exophytic low-attenuation lesion extending off the lower pole of the right kidney measuring 4.1 cm in diameter, incompletely characterized on today's noncontrast CT examination, but statistically likely a cyst. Unenhanced appearance of the kidneys, bilateral adrenal glands and urinary bladder is otherwise unremarkable. Stomach/Bowel: Unenhanced appearance of the stomach is normal. No pathologic dilatation of small bowel or colon. Status post appendectomy. Vascular/Lymphatic: Aortic atherosclerosis. No lymphadenopathy noted in the abdomen or pelvis. 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 Prostate gland and seminal vesicles are unremarkable in appearance. Other: No significant volume of ascites.  No pneumoperitoneum. Musculoskeletal: There are no aggressive appearing lytic or blastic lesions noted in the visualized portions of the skeleton. IMPRESSION: 1. Multiple tiny 2-3 mm nonobstructive calculi are noted within the collecting systems of both kidneys. No ureteral stones or findings of urinary tract obstruction are noted at this time. 2. Mild heterogeneous hepatic steatosis. 3. Severe pericardial calcification. Echocardiographic correlation to exclude signs of constrictive physiology is suggested if clinically appropriate. 4. Aortic atherosclerosis. 5. Additional incidental findings, as above. Aortic Atherosclerosis (ICD10-I70.0). Electronically Signed   By: Vinnie Langton M.D.    On: 11/22/2017 12:04    Procedures Procedures (including critical care time)  Medications Ordered in ED Medications  ketorolac (TORADOL) 30 MG/ML injection 30 mg (30 mg Intravenous Given 11/22/17 1110)  ondansetron (ZOFRAN) injection 4 mg (4 mg Intravenous Given 11/22/17 1110)  sodium chloride 0.9 % bolus 1,000 mL (0 mLs Intravenous Stopped 11/22/17 1247)  morphine 4 MG/ML injection 4 mg (4 mg Intravenous Given 11/22/17 1247)  cefTRIAXone (ROCEPHIN) 1 g in sodium chloride 0.9 % 100 mL IVPB (0 g Intravenous Stopped 11/22/17 1455)  fentaNYL (SUBLIMAZE) injection 25 mcg (25 mcg Intravenous Given 11/22/17 1515)     Initial Impression / Assessment and Plan / ED Course  I have reviewed the triage vital signs and the nursing notes.  Pertinent labs & imaging results that were available during my care of the patient were reviewed by me and considered in my medical decision making (see chart for details).     52 year old male with past [place] history of kidney stones who presents for evaluation of flank pain and hematuria that began this morning.  Reports he had some dysuria over the last few days.  No fevers.  Reports a history of kidney stones but last one was several years ago.  Had followed up with urologist but has not seen them in several years.  No fevers, vomiting.  Has had some nausea. Patient is afebrile, non-toxic appearing, sitting comfortably on examination table. Vital signs reviewed and stable.  Physical exam shows left-sided CVA tenderness.  Concern for kidney stone vs UTI.  Physical exam is not concerning for epididymitis, orchitis, testicular torsion, hernia.  Plan to check basic labs, UA.  Will plan for renal study.  Fluids and analgesics provided in the department.  CBC shows leukocytosis of 17.2.  No significant anemia.  BMP shows glucose of 258.  BUN and creatinine stable.  UA is turbid, unable to determine nitrates, leukocytes given hematuria.  There is pyuria noted.  CT renal study shows  multiple tiny 2 to 3 mm nonobstructing calculi noted within the kidneys.  No ureteral stones.  No obstructing stones.  No other acute abnormalities.  Given hematuria, pyuria, will plan to treat as  hemorrhagic cystitis.  Updated patient on plan.  He reports improvement in pain after analgesics.  Vital signs stable.  Plan to p.o. challenge patient.  Patient able tolerate p.o. without any difficulty.  Patient reports pain improved.  We will plan to the patient home on antibiotics and pain control.  He reports he has seen urology before but does not recall the specific doctor.  Plan to give outpatient urology referral. Patient had ample opportunity for questions and discussion. All patient's questions were answered with full understanding. Strict return precautions discussed. Patient expresses understanding and agreement to plan.   Final Clinical Impressions(s) / ED Diagnoses   Final diagnoses:  Hemorrhagic cystitis    ED Discharge Orders        Ordered    cephALEXin (KEFLEX) 500 MG capsule  4 times daily     11/22/17 1452    oxyCODONE-acetaminophen (PERCOCET/ROXICET) 5-325 MG tablet  Every 4 hours PRN     11/22/17 1452       Volanda Napoleon, PA-C 11/22/17 2329    Blanchie Dessert, MD 11/23/17 1012

## 2017-12-30 DIAGNOSIS — E1165 Type 2 diabetes mellitus with hyperglycemia: Secondary | ICD-10-CM | POA: Diagnosis not present

## 2017-12-30 DIAGNOSIS — E1140 Type 2 diabetes mellitus with diabetic neuropathy, unspecified: Secondary | ICD-10-CM | POA: Diagnosis not present

## 2018-01-20 DIAGNOSIS — Z6832 Body mass index (BMI) 32.0-32.9, adult: Secondary | ICD-10-CM | POA: Diagnosis not present

## 2018-01-20 DIAGNOSIS — E6609 Other obesity due to excess calories: Secondary | ICD-10-CM | POA: Diagnosis not present

## 2018-01-20 DIAGNOSIS — E7849 Other hyperlipidemia: Secondary | ICD-10-CM | POA: Diagnosis not present

## 2018-01-20 DIAGNOSIS — R000 Tachycardia, unspecified: Secondary | ICD-10-CM | POA: Diagnosis not present

## 2018-01-20 DIAGNOSIS — E1065 Type 1 diabetes mellitus with hyperglycemia: Secondary | ICD-10-CM | POA: Diagnosis not present

## 2018-01-20 DIAGNOSIS — Z1389 Encounter for screening for other disorder: Secondary | ICD-10-CM | POA: Diagnosis not present

## 2018-01-20 DIAGNOSIS — E782 Mixed hyperlipidemia: Secondary | ICD-10-CM | POA: Diagnosis not present

## 2018-01-20 DIAGNOSIS — Z0001 Encounter for general adult medical examination with abnormal findings: Secondary | ICD-10-CM | POA: Diagnosis not present

## 2018-01-20 DIAGNOSIS — F3110 Bipolar disorder, current episode manic without psychotic features, unspecified: Secondary | ICD-10-CM | POA: Diagnosis not present

## 2018-01-20 DIAGNOSIS — E7841 Elevated Lipoprotein(a): Secondary | ICD-10-CM | POA: Diagnosis not present

## 2018-03-24 ENCOUNTER — Ambulatory Visit: Payer: BLUE CROSS/BLUE SHIELD | Admitting: Cardiology

## 2018-05-05 DIAGNOSIS — Z23 Encounter for immunization: Secondary | ICD-10-CM | POA: Diagnosis not present

## 2018-05-05 DIAGNOSIS — E1165 Type 2 diabetes mellitus with hyperglycemia: Secondary | ICD-10-CM | POA: Diagnosis not present

## 2018-05-05 DIAGNOSIS — E1140 Type 2 diabetes mellitus with diabetic neuropathy, unspecified: Secondary | ICD-10-CM | POA: Diagnosis not present

## 2018-05-05 DIAGNOSIS — E1169 Type 2 diabetes mellitus with other specified complication: Secondary | ICD-10-CM | POA: Diagnosis not present

## 2018-05-19 DIAGNOSIS — E785 Hyperlipidemia, unspecified: Secondary | ICD-10-CM | POA: Diagnosis not present

## 2018-05-19 DIAGNOSIS — E1165 Type 2 diabetes mellitus with hyperglycemia: Secondary | ICD-10-CM | POA: Diagnosis not present

## 2018-05-19 DIAGNOSIS — E1169 Type 2 diabetes mellitus with other specified complication: Secondary | ICD-10-CM | POA: Diagnosis not present

## 2018-05-19 DIAGNOSIS — E1140 Type 2 diabetes mellitus with diabetic neuropathy, unspecified: Secondary | ICD-10-CM | POA: Diagnosis not present

## 2018-08-11 DIAGNOSIS — Z794 Long term (current) use of insulin: Secondary | ICD-10-CM | POA: Diagnosis not present

## 2018-08-11 DIAGNOSIS — E785 Hyperlipidemia, unspecified: Secondary | ICD-10-CM | POA: Diagnosis not present

## 2018-08-11 DIAGNOSIS — E1140 Type 2 diabetes mellitus with diabetic neuropathy, unspecified: Secondary | ICD-10-CM | POA: Diagnosis not present

## 2018-08-11 DIAGNOSIS — E1165 Type 2 diabetes mellitus with hyperglycemia: Secondary | ICD-10-CM | POA: Diagnosis not present

## 2018-08-11 DIAGNOSIS — E1169 Type 2 diabetes mellitus with other specified complication: Secondary | ICD-10-CM | POA: Diagnosis not present

## 2018-08-21 DIAGNOSIS — K6389 Other specified diseases of intestine: Secondary | ICD-10-CM | POA: Diagnosis not present

## 2018-08-21 DIAGNOSIS — K5289 Other specified noninfective gastroenteritis and colitis: Secondary | ICD-10-CM | POA: Diagnosis not present

## 2018-08-21 DIAGNOSIS — K648 Other hemorrhoids: Secondary | ICD-10-CM | POA: Diagnosis not present

## 2018-08-21 DIAGNOSIS — Z86010 Personal history of colonic polyps: Secondary | ICD-10-CM | POA: Diagnosis not present

## 2018-08-21 DIAGNOSIS — D120 Benign neoplasm of cecum: Secondary | ICD-10-CM | POA: Diagnosis not present

## 2018-08-21 DIAGNOSIS — D125 Benign neoplasm of sigmoid colon: Secondary | ICD-10-CM | POA: Diagnosis not present

## 2018-08-21 DIAGNOSIS — D122 Benign neoplasm of ascending colon: Secondary | ICD-10-CM | POA: Diagnosis not present

## 2018-08-21 DIAGNOSIS — K633 Ulcer of intestine: Secondary | ICD-10-CM | POA: Diagnosis not present

## 2018-08-21 DIAGNOSIS — D124 Benign neoplasm of descending colon: Secondary | ICD-10-CM | POA: Diagnosis not present

## 2018-08-21 DIAGNOSIS — K635 Polyp of colon: Secondary | ICD-10-CM | POA: Diagnosis not present

## 2018-08-25 DIAGNOSIS — D122 Benign neoplasm of ascending colon: Secondary | ICD-10-CM | POA: Diagnosis not present

## 2018-08-25 DIAGNOSIS — D125 Benign neoplasm of sigmoid colon: Secondary | ICD-10-CM | POA: Diagnosis not present

## 2018-08-25 DIAGNOSIS — D124 Benign neoplasm of descending colon: Secondary | ICD-10-CM | POA: Diagnosis not present

## 2018-08-25 DIAGNOSIS — D120 Benign neoplasm of cecum: Secondary | ICD-10-CM | POA: Diagnosis not present

## 2018-09-03 DIAGNOSIS — Z6833 Body mass index (BMI) 33.0-33.9, adult: Secondary | ICD-10-CM | POA: Diagnosis not present

## 2018-09-03 DIAGNOSIS — Z1389 Encounter for screening for other disorder: Secondary | ICD-10-CM | POA: Diagnosis not present

## 2018-09-03 DIAGNOSIS — E6609 Other obesity due to excess calories: Secondary | ICD-10-CM | POA: Diagnosis not present

## 2018-09-03 DIAGNOSIS — E1065 Type 1 diabetes mellitus with hyperglycemia: Secondary | ICD-10-CM | POA: Diagnosis not present

## 2018-09-03 DIAGNOSIS — F3110 Bipolar disorder, current episode manic without psychotic features, unspecified: Secondary | ICD-10-CM | POA: Diagnosis not present

## 2018-09-03 DIAGNOSIS — F321 Major depressive disorder, single episode, moderate: Secondary | ICD-10-CM | POA: Diagnosis not present

## 2018-10-21 DIAGNOSIS — E6609 Other obesity due to excess calories: Secondary | ICD-10-CM | POA: Diagnosis not present

## 2018-10-21 DIAGNOSIS — Z6833 Body mass index (BMI) 33.0-33.9, adult: Secondary | ICD-10-CM | POA: Diagnosis not present

## 2018-10-21 DIAGNOSIS — J019 Acute sinusitis, unspecified: Secondary | ICD-10-CM | POA: Diagnosis not present

## 2018-10-21 DIAGNOSIS — J309 Allergic rhinitis, unspecified: Secondary | ICD-10-CM | POA: Diagnosis not present

## 2018-11-09 DIAGNOSIS — E1165 Type 2 diabetes mellitus with hyperglycemia: Secondary | ICD-10-CM | POA: Diagnosis not present

## 2018-11-09 DIAGNOSIS — E1140 Type 2 diabetes mellitus with diabetic neuropathy, unspecified: Secondary | ICD-10-CM | POA: Diagnosis not present

## 2018-11-09 DIAGNOSIS — Z794 Long term (current) use of insulin: Secondary | ICD-10-CM | POA: Diagnosis not present

## 2018-11-11 DIAGNOSIS — E785 Hyperlipidemia, unspecified: Secondary | ICD-10-CM | POA: Diagnosis not present

## 2018-11-11 DIAGNOSIS — E1140 Type 2 diabetes mellitus with diabetic neuropathy, unspecified: Secondary | ICD-10-CM | POA: Diagnosis not present

## 2018-11-11 DIAGNOSIS — E1169 Type 2 diabetes mellitus with other specified complication: Secondary | ICD-10-CM | POA: Diagnosis not present

## 2018-11-11 DIAGNOSIS — Z794 Long term (current) use of insulin: Secondary | ICD-10-CM | POA: Diagnosis not present

## 2019-02-08 DIAGNOSIS — E1165 Type 2 diabetes mellitus with hyperglycemia: Secondary | ICD-10-CM | POA: Diagnosis not present

## 2019-02-08 DIAGNOSIS — E1169 Type 2 diabetes mellitus with other specified complication: Secondary | ICD-10-CM | POA: Diagnosis not present

## 2019-02-08 DIAGNOSIS — Z794 Long term (current) use of insulin: Secondary | ICD-10-CM | POA: Diagnosis not present

## 2019-02-08 DIAGNOSIS — E785 Hyperlipidemia, unspecified: Secondary | ICD-10-CM | POA: Diagnosis not present

## 2019-02-08 DIAGNOSIS — E1140 Type 2 diabetes mellitus with diabetic neuropathy, unspecified: Secondary | ICD-10-CM | POA: Diagnosis not present

## 2019-02-22 DIAGNOSIS — M7661 Achilles tendinitis, right leg: Secondary | ICD-10-CM | POA: Diagnosis not present

## 2019-02-22 DIAGNOSIS — M7731 Calcaneal spur, right foot: Secondary | ICD-10-CM | POA: Diagnosis not present

## 2019-02-22 DIAGNOSIS — M25771 Osteophyte, right ankle: Secondary | ICD-10-CM | POA: Diagnosis not present

## 2019-02-22 DIAGNOSIS — M7660 Achilles tendinitis, unspecified leg: Secondary | ICD-10-CM | POA: Diagnosis not present

## 2019-03-16 DIAGNOSIS — F3110 Bipolar disorder, current episode manic without psychotic features, unspecified: Secondary | ICD-10-CM | POA: Diagnosis not present

## 2019-03-16 DIAGNOSIS — E7849 Other hyperlipidemia: Secondary | ICD-10-CM | POA: Diagnosis not present

## 2019-03-16 DIAGNOSIS — E1065 Type 1 diabetes mellitus with hyperglycemia: Secondary | ICD-10-CM | POA: Diagnosis not present

## 2019-03-16 DIAGNOSIS — R000 Tachycardia, unspecified: Secondary | ICD-10-CM | POA: Diagnosis not present

## 2019-03-16 DIAGNOSIS — Z0001 Encounter for general adult medical examination with abnormal findings: Secondary | ICD-10-CM | POA: Diagnosis not present

## 2019-03-16 DIAGNOSIS — Z6832 Body mass index (BMI) 32.0-32.9, adult: Secondary | ICD-10-CM | POA: Diagnosis not present

## 2019-03-16 DIAGNOSIS — E7841 Elevated Lipoprotein(a): Secondary | ICD-10-CM | POA: Diagnosis not present

## 2019-04-13 DIAGNOSIS — Z23 Encounter for immunization: Secondary | ICD-10-CM | POA: Diagnosis not present

## 2019-04-13 DIAGNOSIS — E1140 Type 2 diabetes mellitus with diabetic neuropathy, unspecified: Secondary | ICD-10-CM | POA: Diagnosis not present

## 2019-04-13 DIAGNOSIS — E1165 Type 2 diabetes mellitus with hyperglycemia: Secondary | ICD-10-CM | POA: Diagnosis not present

## 2019-04-13 DIAGNOSIS — Z794 Long term (current) use of insulin: Secondary | ICD-10-CM | POA: Diagnosis not present

## 2019-05-25 DIAGNOSIS — E1140 Type 2 diabetes mellitus with diabetic neuropathy, unspecified: Secondary | ICD-10-CM | POA: Diagnosis not present

## 2019-05-25 DIAGNOSIS — E1169 Type 2 diabetes mellitus with other specified complication: Secondary | ICD-10-CM | POA: Diagnosis not present

## 2019-05-25 DIAGNOSIS — Z794 Long term (current) use of insulin: Secondary | ICD-10-CM | POA: Diagnosis not present

## 2019-05-25 DIAGNOSIS — E1165 Type 2 diabetes mellitus with hyperglycemia: Secondary | ICD-10-CM | POA: Diagnosis not present

## 2019-05-25 DIAGNOSIS — E785 Hyperlipidemia, unspecified: Secondary | ICD-10-CM | POA: Diagnosis not present

## 2019-06-30 ENCOUNTER — Other Ambulatory Visit: Payer: Self-pay

## 2019-06-30 DIAGNOSIS — Z20822 Contact with and (suspected) exposure to covid-19: Secondary | ICD-10-CM

## 2019-07-01 LAB — NOVEL CORONAVIRUS, NAA: SARS-CoV-2, NAA: NOT DETECTED

## 2019-07-02 ENCOUNTER — Telehealth: Payer: Self-pay | Admitting: Family Medicine

## 2019-07-02 NOTE — Telephone Encounter (Signed)
Patient called in and received his negative covid test result  °

## 2019-07-09 DIAGNOSIS — H6123 Impacted cerumen, bilateral: Secondary | ICD-10-CM | POA: Diagnosis not present

## 2019-07-09 DIAGNOSIS — E6609 Other obesity due to excess calories: Secondary | ICD-10-CM | POA: Diagnosis not present

## 2019-07-09 DIAGNOSIS — Z6834 Body mass index (BMI) 34.0-34.9, adult: Secondary | ICD-10-CM | POA: Diagnosis not present

## 2019-07-21 ENCOUNTER — Emergency Department (HOSPITAL_BASED_OUTPATIENT_CLINIC_OR_DEPARTMENT_OTHER)
Admission: EM | Admit: 2019-07-21 | Discharge: 2019-07-21 | Disposition: A | Discharge status: Home / Self Care | Payer: BC Managed Care – PPO | Attending: Emergency Medicine | Admitting: Emergency Medicine

## 2019-07-21 ENCOUNTER — Other Ambulatory Visit: Payer: Self-pay

## 2019-07-21 ENCOUNTER — Encounter (HOSPITAL_BASED_OUTPATIENT_CLINIC_OR_DEPARTMENT_OTHER): Payer: Self-pay | Admitting: *Deleted

## 2019-07-21 DIAGNOSIS — R200 Anesthesia of skin: Secondary | ICD-10-CM | POA: Diagnosis not present

## 2019-07-21 DIAGNOSIS — I10 Essential (primary) hypertension: Secondary | ICD-10-CM | POA: Insufficient documentation

## 2019-07-21 DIAGNOSIS — M5413 Radiculopathy, cervicothoracic region: Secondary | ICD-10-CM | POA: Insufficient documentation

## 2019-07-21 DIAGNOSIS — Z794 Long term (current) use of insulin: Secondary | ICD-10-CM | POA: Insufficient documentation

## 2019-07-21 DIAGNOSIS — M542 Cervicalgia: Secondary | ICD-10-CM | POA: Diagnosis not present

## 2019-07-21 DIAGNOSIS — Z87891 Personal history of nicotine dependence: Secondary | ICD-10-CM | POA: Insufficient documentation

## 2019-07-21 DIAGNOSIS — E119 Type 2 diabetes mellitus without complications: Secondary | ICD-10-CM | POA: Insufficient documentation

## 2019-07-21 DIAGNOSIS — Z7982 Long term (current) use of aspirin: Secondary | ICD-10-CM | POA: Insufficient documentation

## 2019-07-21 DIAGNOSIS — Z79899 Other long term (current) drug therapy: Secondary | ICD-10-CM | POA: Insufficient documentation

## 2019-07-21 MED ORDER — DICLOFENAC SODIUM 1 % EX GEL: 4.0000 g | Freq: Four times a day (QID) | CUTANEOUS | 0 refills | Status: DC

## 2019-07-21 MED ORDER — DEXAMETHASONE SODIUM PHOSPHATE 10 MG/ML IJ SOLN
10.0000 mg | Freq: Once | INTRAMUSCULAR | Status: AC
  Administered 2019-07-21: 10 mg via INTRAMUSCULAR
  Filled 2019-07-21: qty 1

## 2019-07-21 MED ORDER — HYDROCODONE-ACETAMINOPHEN 5-325 MG PO TABS: 1.0000 | ORAL_TABLET | Freq: Four times a day (QID) | ORAL | 0 refills | Status: DC | PRN

## 2019-07-21 NOTE — ED Triage Notes (Signed)
Pt c/o neck and left shoulder pain , increase pain with movt, states he cant raise his left arm over head x 3 days

## 2019-07-21 NOTE — ED Provider Notes (Signed)
[facility] DEPARTMENT Provider Note   CSN: [id number] Arrival date & time: 07/21/19  1259     History Chief Complaint  Patient presents with  . Neck Pain    Levi Meyers is a 53 y.o. male.  Patient is a 53 year old male who has a history of diabetes, hyperlipidemia and hypertension who presents with left-sided neck pain.  He states he woke up 3 days ago with the pain.  It starts in his left neck and radiates down his left shoulder, at times going down into his left hand.  He has some intermittent numbness in his left hand, mostly on the thumb side.  He denies any weakness in the arm or hand.  No fevers.  No recent injuries.  Has had a prior cervical fusion many years ago.  He has been taking Tylenol without improvement in symptoms.        Past Medical History:  Diagnosis Date  . Asthma   . Depression   . Diabetes mellitus   . Diabetes mellitus without complication ([place])   . GERD (gastroesophageal reflux disease)   . High cholesterol   . History of kidney stones   . Hyperlipidemia   . Hypertension   . Kidney disease    stones  . Kidney stone   . Noncompliance with diabetes treatment   . Sleep apnea    cpap    Patient Active Problem List   Diagnosis Date Noted  . Cervical spondylosis with radiculopathy 06/24/2014  . OSA on CPAP 02/18/2013  . DOE (dyspnea on exertion) 01/07/2013  . Tachycardia 01/07/2013  . Edema extremities 01/07/2013  . Bipolar affective disorder ([place]) 03/31/2012  . Type 1 diabetes mellitus with hypoglycemia ([place]) 11/22/2011  . Amblyopia 11/22/2011  . Bronchitis, acute 11/03/2011  . DIABETES MELLITUS, TYPE I 04/28/2008  . HYPERLIPIDEMIA 04/28/2008  . RHEUMATIC FEVER 04/28/2008  . HYPERTENSION 04/28/2008  . RENAL CALCULUS, HX OF 04/28/2008  . CHICKENPOX, HX OF 04/28/2008    Past Surgical History:  Procedure Laterality Date  . ANTERIOR CERVICAL DECOMP/DISCECTOMY FUSION N/A 06/24/2014   Procedure: Cervical five-six  anterior cervical decompression with fusion interbody prosthesis with plating and bonegraft;  Surgeon: Consuella Lose, MD;  Location: [facility];  Service: Neurosurgery;  Laterality: N/A;  Cervical five-six anterior cervical decompression with fusion interbody prosthesis with plating and bonegraft  . APPENDECTOMY  2004  . APPENDECTOMY    . CARDIAC CATHETERIZATION     Dr. Verl Blalock ([place])  . CERVICAL SPINE SURGERY    . CHOLECYSTECTOMY  2004  . CHOLECYSTECTOMY    . COLONOSCOPY N/A 09/08/2012   Procedure: COLONOSCOPY;  Surgeon: Leighton Ruff, MD;  Location: [facility];  Service: Endoscopy;  Laterality: N/A;  . EYE SURGERY  1970  . EYE SURGERY    . HERNIA REPAIR    . KNEE CARTILAGE SURGERY    . LEG SURGERY    . SKIN GRAFT  1974  . SKIN GRAFT FULL THICKNESS LEG    . STRABISMUS SURGERY         Family History  Problem Relation Age of Onset  . Diabetes Mother   . Heart attack Mother   . Stroke Mother   . Diabetes Father   . Heart disease Other        Parent  . Heart disease Other        Grandparents    Social History   Tobacco Use  . Smoking status: Former Smoker    Years: 4.00  Types: Cigarettes  . Smokeless tobacco: Former Network engineer Use Topics  . Alcohol use: No  . Drug use: No    Home Medications Prior to Admission medications   Medication Sig Start Date End Date Taking? Authorizing Provider  aspirin 81 MG tablet Take 81 mg by mouth daily.    [provider]  atorvastatin (LIPITOR) 20 MG tablet Take 20 mg by mouth daily.    [provider]  buPROPion (WELLBUTRIN XL) 150 MG 24 hr tablet Take 150 mg by mouth daily.    [provider]  cephALEXin (KEFLEX) 500 MG capsule Take 1 capsule (500 mg total) by mouth 4 (four) times daily. 11/22/17   Volanda Napoleon, PA-C  diclofenac Sodium (VOLTAREN) 1 % GEL Apply 4 g topically 4 (four) times daily. 07/21/19   Malvin Johns, MD  Dulaglutide (TRULICITY [place]) Inject 1.5 mg into the skin once a  week.     [provider]  gabapentin (NEURONTIN) 300 MG capsule Take 600 mg by mouth 2 (two) times daily.    [provider]  HYDROcodone-acetaminophen (NORCO/VICODIN) 5-325 MG tablet Take 1-2 tablets by mouth every 6 (six) hours as needed. 07/21/19   Malvin Johns, MD  ibuprofen (ADVIL,MOTRIN) 200 MG tablet Take 400 mg by mouth 2 (two) times daily as needed (pain).    [provider]  insulin aspart (NOVOLOG FLEXPEN) 100 UNIT/ML FlexPen Inject 20 Units into the skin daily before supper.    [provider]  Insulin Glargine (LANTUS SOLOSTAR) 100 UNIT/ML Solostar Pen Inject 60 Units into the skin 2 (two) times daily.     [provider]  lisinopril (PRINIVIL,ZESTRIL) 5 MG tablet Take 5 mg by mouth daily.    [provider]  oxyCODONE-acetaminophen (PERCOCET/ROXICET) 5-325 MG tablet Take 2 tablets by mouth every 4 (four) hours as needed for severe pain. 11/22/17   Volanda Napoleon, PA-C    Allergies    Byetta 10 mcg pen [exenatide], Codeine, Codeine, Invokana [canagliflozin], and Victoza [liraglutide]  Review of Systems   Review of Systems  Constitutional: Negative for fever.  Gastrointestinal: Negative for nausea and vomiting.  Musculoskeletal: Positive for neck pain. Negative for arthralgias, back pain and joint swelling.  Skin: Negative for wound.  Neurological: Positive for numbness. Negative for weakness and headaches.    Physical Exam Updated Vital Signs BP (!) 148/100   Pulse (!) 108   Temp 98.1 F (36.7 C)   Resp 18   Ht 5\' 5"  (1.651 m)   Wt 92.5 kg   SpO2 96%   BMI 33.95 kg/m   Physical Exam Constitutional:      Appearance: He is well-developed.  HENT:     Head: Normocephalic and atraumatic.  Neck:     Comments: Patient has tenderness on the left cervical paraspinal area over the trapezius muscle and along the left trapezius muscle.  There is no pain on palpation or range of motion of the shoulder or clavicle.  No  pain along the spine itself.  No significant muscle spasms.  He has normal sensation and motor function in the upper extremities bilaterally.  Radial pulses are intact. Cardiovascular:     Rate and Rhythm: Normal rate.  Pulmonary:     Effort: Pulmonary effort is normal.  Musculoskeletal:        General: No tenderness.     Cervical back: Normal range of motion and neck supple.  Skin:    General: Skin is warm and dry.  Neurological:  Mental Status: He is alert and oriented to person, place, and time.     ED Results / Procedures / Treatments   Labs (all labs ordered are listed, but only abnormal results are displayed) Labs Reviewed - No data to display  EKG None  Radiology No results found.  Procedures Procedures (including critical care time)  Medications Ordered in ED Medications  dexamethasone (DECADRON) injection 10 mg (has no administration in time range)    ED Course  I have reviewed the triage vital signs and the nursing notes.  Pertinent labs & imaging results that were available during my care of the patient were reviewed by me and considered in my medical decision making (see chart for details).    MDM Rules/Calculators/A&P                      Patient presents with radicular left side neck pain.  He has no neurological deficits but he does report some intermittent numbness.  He has no injuries or other indications for urgent imaging.  He was given a shot of Decadron in the ED.  He was advised that his blood sugars may be elevated and he will check them frequently and he says that he can adjust his insulin as needed.  He was given a prescription for short course of hydrocodone as well as Voltaren gel.  He has an appointment to follow-up with his PCP on January 6.  Return precautions were given. Final Clinical Impression(s) / ED Diagnoses Final diagnoses:  Radiculopathy of cervicothoracic region    Rx / DC Orders ED Discharge Orders         Ordered     HYDROcodone-acetaminophen (NORCO/VICODIN) 5-325 MG tablet  Every 6 hours PRN     07/21/19 1530    diclofenac Sodium (VOLTAREN) 1 % GEL  4 times daily     07/21/19 1530           Malvin Johns, MD 07/21/19 1535

## 2019-07-28 ENCOUNTER — Emergency Department (HOSPITAL_COMMUNITY)
Admission: EM | Admit: 2019-07-28 | Discharge: 2019-07-28 | Disposition: A | Discharge status: Home / Self Care | Payer: BC Managed Care – PPO | Attending: Emergency Medicine | Admitting: Emergency Medicine

## 2019-07-28 ENCOUNTER — Other Ambulatory Visit: Payer: Self-pay

## 2019-07-28 ENCOUNTER — Emergency Department (HOSPITAL_COMMUNITY): Payer: BC Managed Care – PPO

## 2019-07-28 ENCOUNTER — Encounter (HOSPITAL_COMMUNITY): Payer: Self-pay | Admitting: Emergency Medicine

## 2019-07-28 DIAGNOSIS — Z87891 Personal history of nicotine dependence: Secondary | ICD-10-CM | POA: Insufficient documentation

## 2019-07-28 DIAGNOSIS — R079 Chest pain, unspecified: Secondary | ICD-10-CM

## 2019-07-28 DIAGNOSIS — R05 Cough: Secondary | ICD-10-CM | POA: Diagnosis not present

## 2019-07-28 DIAGNOSIS — U071 COVID-19: Secondary | ICD-10-CM | POA: Diagnosis not present

## 2019-07-28 DIAGNOSIS — B9689 Other specified bacterial agents as the cause of diseases classified elsewhere: Secondary | ICD-10-CM | POA: Diagnosis not present

## 2019-07-28 DIAGNOSIS — R072 Precordial pain: Secondary | ICD-10-CM | POA: Diagnosis not present

## 2019-07-28 DIAGNOSIS — I10 Essential (primary) hypertension: Secondary | ICD-10-CM | POA: Insufficient documentation

## 2019-07-28 DIAGNOSIS — J129 Viral pneumonia, unspecified: Secondary | ICD-10-CM | POA: Insufficient documentation

## 2019-07-28 DIAGNOSIS — R0689 Other abnormalities of breathing: Secondary | ICD-10-CM | POA: Diagnosis not present

## 2019-07-28 DIAGNOSIS — J45909 Unspecified asthma, uncomplicated: Secondary | ICD-10-CM | POA: Diagnosis not present

## 2019-07-28 DIAGNOSIS — E109 Type 1 diabetes mellitus without complications: Secondary | ICD-10-CM | POA: Diagnosis not present

## 2019-07-28 DIAGNOSIS — R509 Fever, unspecified: Secondary | ICD-10-CM | POA: Diagnosis not present

## 2019-07-28 DIAGNOSIS — Z7982 Long term (current) use of aspirin: Secondary | ICD-10-CM | POA: Insufficient documentation

## 2019-07-28 DIAGNOSIS — Z79899 Other long term (current) drug therapy: Secondary | ICD-10-CM | POA: Diagnosis not present

## 2019-07-28 DIAGNOSIS — R0789 Other chest pain: Secondary | ICD-10-CM | POA: Diagnosis not present

## 2019-07-28 LAB — CBC WITH DIFFERENTIAL/PLATELET
Abs Immature Granulocytes: 0.03 10*3/uL (ref 0.00–0.07)
Basophils Absolute: 0 10*3/uL (ref 0.0–0.1)
Basophils Relative: 0 %
Eosinophils Absolute: 0 10*3/uL (ref 0.0–0.5)
Eosinophils Relative: 0 %
HCT: 45.3 % (ref 39.0–52.0)
Hemoglobin: 14.8 g/dL (ref 13.0–17.0)
Immature Granulocytes: 0 %
Lymphocytes Relative: 18 %
Lymphs Abs: 1.8 10*3/uL (ref 0.7–4.0)
MCH: 29.5 pg (ref 26.0–34.0)
MCHC: 32.7 g/dL (ref 30.0–36.0)
MCV: 90.2 fL (ref 80.0–100.0)
Monocytes Absolute: 1 10*3/uL (ref 0.1–1.0)
Monocytes Relative: 9 %
Neutro Abs: 7.3 10*3/uL (ref 1.7–7.7)
Neutrophils Relative %: 73 %
Platelets: 259 10*3/uL (ref 150–400)
RBC: 5.02 MIL/uL (ref 4.22–5.81)
RDW: 13 % (ref 11.5–15.5)
WBC: 10.2 10*3/uL (ref 4.0–10.5)
nRBC: 0 % (ref 0.0–0.2)

## 2019-07-28 LAB — BASIC METABOLIC PANEL
Anion gap: 10 (ref 5–15)
BUN: 16 mg/dL (ref 6–20)
CO2: 23 mmol/L (ref 22–32)
Calcium: 8.5 mg/dL — ABNORMAL LOW (ref 8.9–10.3)
Chloride: 100 mmol/L (ref 98–111)
Creatinine, Ser: 0.9 mg/dL (ref 0.61–1.24)
GFR calc Af Amer: >60 mL/min (ref >60)
GFR calc non Af Amer: >60 mL/min (ref >60)
Glucose, Bld: 124 mg/dL — ABNORMAL HIGH (ref 70–99)
Potassium: 3.6 mmol/L (ref 3.5–5.1)
Sodium: 133 mmol/L — ABNORMAL LOW (ref 135–145)

## 2019-07-28 LAB — CBG MONITORING, ED: Glucose-Capillary: 134 mg/dL — ABNORMAL HIGH (ref 70–99)

## 2019-07-28 LAB — POC SARS CORONAVIRUS 2 AG -  ED: SARS Coronavirus 2 Ag: POSITIVE — AB

## 2019-07-28 LAB — TROPONIN I (HIGH SENSITIVITY)
Troponin I (High Sensitivity): 3 ng/L (ref <18)
Troponin I (High Sensitivity): 3 ng/L (ref <18)

## 2019-07-28 MED ORDER — ONDANSETRON HCL 4 MG PO TABS: 4.0000 mg | ORAL_TABLET | Freq: Four times a day (QID) | ORAL | 0 refills | Status: DC

## 2019-07-28 MED ORDER — ONDANSETRON HCL 4 MG/2ML IJ SOLN
4.0000 mg | Freq: Once | INTRAMUSCULAR | Status: AC
  Administered 2019-07-28: 4 mg via INTRAVENOUS
  Filled 2019-07-28: qty 2

## 2019-07-28 NOTE — ED Triage Notes (Signed)
C/o of central chest pain since last night with vomiting x2 this morning. States having a fever last night and cough x 2 days.   324 ASA and one nitro given en route by EMS

## 2019-07-28 NOTE — Discharge Instructions (Addendum)
Your COVID test today was positive.  Please try to isolate yourself at home for at least 7-10 days.  Tylenol every 4 hrs.  Also, contact your cardiologist to arrange a follow-up appt regarding your chest pain.  Return to ER for any worsening symptoms such as increasing pain or shortness of breath.

## 2019-07-28 NOTE — ED Provider Notes (Signed)
   Levi Meyers signed out to me by Levi Jefferson, PA-C at end of shift.  Levi Meyers's delta trop pending.    Patient is a 54 year old male who comes emergency department complaining of central chest pain and vomiting.  Last evening he was noted to have a fever and a nonproductive cough.  Continues to have centralized chest pain and found to be Covid positive on work up today.  Delta troponin unchanged.  He denies dyspnea and abdominal pain.  Has cardiologist in [place], Dr. Burt Knack, and he agrees to arrange f/u   Levi Meyers continues to have nausea w/o vomiting.  Rx written for anti-emetic.    Levi Meyers agrees to home quarantine and return precautions discussed.      Labs Reviewed  BASIC METABOLIC PANEL - Abnormal; Notable for the following components:      Result Value   Sodium 133 (*)    Glucose, Bld 124 (*)    Calcium 8.5 (*)    All other components within normal limits  CBG MONITORING, ED - Abnormal; Notable for the following components:   Glucose-Capillary 134 (*)    All other components within normal limits  POC SARS CORONAVIRUS 2 AG -  ED - Abnormal; Notable for the following components:   SARS Coronavirus 2 Ag POSITIVE (*)    All other components within normal limits  CBC WITH DIFFERENTIAL/PLATELET  TROPONIN I (HIGH SENSITIVITY)  TROPONIN I (HIGH SENSITIVITY)      Kem Parkinson, PA-C 07/28/19 1938    Daleen Bo, MD 07/29/19 1721

## 2019-07-28 NOTE — ED Provider Notes (Signed)
[facility] EMERGENCY DEPARTMENT Provider Note   CSN: [id number] Arrival date & time: 07/28/19  1442     History Chief Complaint  Patient presents with  . Chest Pain    Levi Meyers is a 54 y.o. male with a history significant for type 1 diabetes, asthma, GERD and hypertension presenting with complaint of mid sternal chest pain which was mild pressure sensation last night, then woke him around 9 AM today as sharp  pain initially which was constant and lasted at least an hour, associated with nausea and vomiting x2.   He also reports cough which is occasionally productive of a green sputum.  He also endorses having a subjective fever for the past several days.  He denies abdominal pain, diarrhea, currently has no nausea.  He received aspirin 324 mg in route by EMS, he also had 1 nitro tablet and he states his pain improved from an 8 to a 5 out of 10, now describing mild midsternal pressure.  He denies orthopnea and pleuritic pain.     Review of chart with last cardiac cath in 2009: CONCLUSION: Mild nonobstructive coronary artery disease with 30% narrowing in the proximal LAD, 30% ostial stenosis in the circumflex artery, irregularities in the right coronary, and normal LV function.    The history is provided by the patient.    HPI: A 54 year old patient with a history of treated diabetes, hypertension and hypercholesterolemia presents for evaluation of chest pain. Initial onset of pain was more than 6 hours ago. The patient's chest pain is described as heaviness/pressure/tightness, is sharp and is not worse with exertion. The patient complains of nausea. The patient's chest pain is middle- or left-sided, is not well-localized and does not radiate to the arms/jaw/neck. The patient denies diaphoresis. The patient has no history of stroke, has no history of peripheral artery disease, has not smoked in the past 90 days, has no relevant family history of coronary artery disease (first  degree relative at less than age 75) and does not have an elevated BMI (>=30).   Past Medical History:  Diagnosis Date  . Asthma   . Depression   . Diabetes mellitus   . Diabetes mellitus without complication ([place])   . GERD (gastroesophageal reflux disease)   . High cholesterol   . History of kidney stones   . Hyperlipidemia   . Hypertension   . Kidney disease    stones  . Kidney stone   . Noncompliance with diabetes treatment   . Sleep apnea    cpap    Patient Active Problem List   Diagnosis Date Noted  . Cervical spondylosis with radiculopathy 06/24/2014  . OSA on CPAP 02/18/2013  . DOE (dyspnea on exertion) 01/07/2013  . Tachycardia 01/07/2013  . Edema extremities 01/07/2013  . Bipolar affective disorder ([place]) 03/31/2012  . Type 1 diabetes mellitus with hypoglycemia ([place]) 11/22/2011  . Amblyopia 11/22/2011  . Bronchitis, acute 11/03/2011  . DIABETES MELLITUS, TYPE I 04/28/2008  . HYPERLIPIDEMIA 04/28/2008  . RHEUMATIC FEVER 04/28/2008  . HYPERTENSION 04/28/2008  . RENAL CALCULUS, HX OF 04/28/2008  . CHICKENPOX, HX OF 04/28/2008    Past Surgical History:  Procedure Laterality Date  . ANTERIOR CERVICAL DECOMP/DISCECTOMY FUSION N/A 06/24/2014   Procedure: Cervical five-six anterior cervical decompression with fusion interbody prosthesis with plating and bonegraft;  Surgeon: Consuella Lose, MD;  Location: [place] [facility];  Service: Neurosurgery;  Laterality: N/A;  Cervical five-six anterior cervical decompression with fusion interbody prosthesis with plating and bonegraft  .  APPENDECTOMY  2004  . APPENDECTOMY    . CARDIAC CATHETERIZATION     Dr. Verl Blalock ([place])  . CERVICAL SPINE SURGERY    . CHOLECYSTECTOMY  2004  . CHOLECYSTECTOMY    . COLONOSCOPY N/A 09/08/2012   Procedure: COLONOSCOPY;  Surgeon: Leighton Ruff, MD;  Location: [facility];  Service: Endoscopy;  Laterality: N/A;  . EYE SURGERY  1970  . EYE SURGERY    . HERNIA REPAIR    . KNEE CARTILAGE SURGERY      . LEG SURGERY    . SKIN GRAFT  1974  . SKIN GRAFT FULL THICKNESS LEG    . STRABISMUS SURGERY         Family History  Problem Relation Age of Onset  . Diabetes Mother   . Heart attack Mother   . Stroke Mother   . Diabetes Father   . Heart disease Other        Parent  . Heart disease Other        Grandparents    Social History   Tobacco Use  . Smoking status: Former Smoker    Years: 4.00    Types: Cigarettes  . Smokeless tobacco: Former Network engineer Use Topics  . Alcohol use: No  . Drug use: No    Home Medications Prior to Admission medications   Medication Sig Start Date End Date Taking? Authorizing Provider  aspirin 81 MG tablet Take 81 mg by mouth daily.    [provider]  atorvastatin (LIPITOR) 20 MG tablet Take 20 mg by mouth daily.    [provider]  buPROPion (WELLBUTRIN XL) 150 MG 24 hr tablet Take 150 mg by mouth daily.    [provider]  cephALEXin (KEFLEX) 500 MG capsule Take 1 capsule (500 mg total) by mouth 4 (four) times daily. 11/22/17   Volanda Napoleon, PA-C  diclofenac Sodium (VOLTAREN) 1 % GEL Apply 4 g topically 4 (four) times daily. 07/21/19   Malvin Johns, MD  Dulaglutide (TRULICITY  [place]) Inject 1.5 mg into the skin once a week.     [provider]  gabapentin (NEURONTIN) 300 MG capsule Take 600 mg by mouth 2 (two) times daily.    [provider]  HYDROcodone-acetaminophen (NORCO/VICODIN) 5-325 MG tablet Take 1-2 tablets by mouth every 6 (six) hours as needed. 07/21/19   Malvin Johns, MD  ibuprofen (ADVIL,MOTRIN) 200 MG tablet Take 400 mg by mouth 2 (two) times daily as needed (pain).    [provider]  insulin aspart (NOVOLOG FLEXPEN) 100 UNIT/ML FlexPen Inject 20 Units into the skin daily before supper.    [provider]  Insulin Glargine (LANTUS SOLOSTAR) 100 UNIT/ML Solostar Pen Inject 60 Units into the skin 2 (two) times daily.     [provider]  lisinopril  (PRINIVIL,ZESTRIL) 5 MG tablet Take 5 mg by mouth daily.    [provider]  oxyCODONE-acetaminophen (PERCOCET/ROXICET) 5-325 MG tablet Take 2 tablets by mouth every 4 (four) hours as needed for severe pain. 11/22/17   Volanda Napoleon, PA-C    Allergies    Byetta 10 mcg pen [exenatide], Codeine, Codeine, Invokana [canagliflozin], and Victoza [liraglutide]  Review of Systems   Review of Systems  Constitutional: Positive for fever. Negative for chills.  HENT: Negative for congestion and sore throat.   Eyes: Negative.   Respiratory: Positive for cough and shortness of breath. Negative for chest tightness.   Cardiovascular: Positive for chest pain. Negative for palpitations and leg swelling.  Gastrointestinal: Positive for nausea and vomiting. Negative for abdominal pain.  Genitourinary: Negative.   Musculoskeletal: Negative for arthralgias, joint swelling and neck pain.  Skin: Negative.  Negative for rash and wound.  Neurological: Negative for dizziness, weakness, light-headedness, numbness and headaches.  Psychiatric/Behavioral: Negative.     Physical Exam Updated Vital Signs BP 104/81   Pulse 97   Temp 98.9 F (37.2 C) (Oral)   Resp 15   Ht 5\' 5"  (1.651 m)   Wt 90.7 kg   SpO2 95%   BMI 33.28 kg/m   Physical Exam Vitals and nursing note reviewed.  Constitutional:      Appearance: He is well-developed.  HENT:     Head: Normocephalic and atraumatic.  Eyes:     Conjunctiva/sclera: Conjunctivae normal.  Cardiovascular:     Rate and Rhythm: Normal rate and regular rhythm.     Heart sounds: Normal heart sounds.  Pulmonary:     Effort: Pulmonary effort is normal.     Breath sounds: Normal breath sounds. No wheezing, rhonchi or rales.  Abdominal:     General: Bowel sounds are normal.     Palpations: Abdomen is soft.     Tenderness: There is no abdominal tenderness.  Musculoskeletal:        General: Normal range of motion.     Cervical back: Normal range of  motion.  Skin:    General: Skin is warm and dry.  Neurological:     Mental Status: He is alert.     ED Results / Procedures / Treatments   Labs (all labs ordered are listed, but only abnormal results are displayed) Labs Reviewed  BASIC METABOLIC PANEL - Abnormal; Notable for the following components:      Result Value   Sodium 133 (*)    Glucose, Bld 124 (*)    Calcium 8.5 (*)    All other components within normal limits  CBG MONITORING, ED - Abnormal; Notable for the following components:   Glucose-Capillary 134 (*)    All other components within normal limits  POC SARS CORONAVIRUS 2 AG -  ED - Abnormal; Notable for the following components:   SARS Coronavirus 2 Ag POSITIVE (*)    All other components within normal limits  CBC WITH DIFFERENTIAL/PLATELET  TROPONIN I (HIGH SENSITIVITY)    EKG EKG Interpretation  Date/Time:  Wednesday July 28 2019 15:02:17 EST Ventricular Rate:  92 PR Interval:    QRS Duration: 89 QT Interval:  337 QTC Calculation: 417 R Axis:   78 Text Interpretation: Sinus rhythm Probable anteroseptal infarct, old since last tracing no significant change Confirmed by Daleen Bo [phone]) on 07/28/2019 3:57:25 PM   Radiology DG Chest Portable 1 View  Result Date: 07/28/2019 CLINICAL DATA:  Chest pain and cough. Subjective fever. Vomiting. EXAM: PORTABLE CHEST 1 VIEW COMPARISON:  Chest x-ray dated 10/10/2015 FINDINGS: There are small patchy areas of peripheral infiltrate in both lungs, most prominent at the left base in the left midzone laterally but there are faint areas of increased density in the right lung as well. Heart size and vascularity are normal. No acute bone abnormality. No effusions. IMPRESSION: Small patchy areas of infiltrate in both lungs, most prominent in the left midzone laterally. Findings are consistent with viral pneumonia. Electronically Signed   By: Lorriane Shire M.D.   On: 07/28/2019 15:42    Procedures Procedures (including  critical care time)  Medications Ordered in ED Medications - No data to display  ED Course  I have reviewed the triage vital signs and the nursing notes.  Pertinent labs & imaging results that were available during my care of the patient were reviewed by me and considered in my medical decision making (see chart for details).    MDM Rules/Calculators/A&P HEAR Score: 4                    Pt with heart score of 4, first troponin 3 ng/L.  Covid +. Pending delta troponin at this time.  If delta trop unchanged, will plan dc home with close outpatient f/u.  Will require home quarantine given Covid + status.    Pt signed out to Kem Parkinson, PA-C Final Clinical Impression(s) / ED Diagnoses Final diagnoses:  COVID-19  Chest pain, unspecified type  Viral pneumonia    Rx / DC Orders ED Discharge Orders    None       Landis Martins 07/28/19 1628    Daleen Bo, MD 07/29/19 1721

## 2019-07-29 ENCOUNTER — Other Ambulatory Visit: Payer: BC Managed Care – PPO

## 2019-08-09 DIAGNOSIS — Z1389 Encounter for screening for other disorder: Secondary | ICD-10-CM | POA: Diagnosis not present

## 2019-08-09 DIAGNOSIS — E6609 Other obesity due to excess calories: Secondary | ICD-10-CM | POA: Diagnosis not present

## 2019-08-09 DIAGNOSIS — M25512 Pain in left shoulder: Secondary | ICD-10-CM | POA: Diagnosis not present

## 2019-08-09 DIAGNOSIS — Z6833 Body mass index (BMI) 33.0-33.9, adult: Secondary | ICD-10-CM | POA: Diagnosis not present

## 2019-08-12 DIAGNOSIS — E1169 Type 2 diabetes mellitus with other specified complication: Secondary | ICD-10-CM | POA: Diagnosis not present

## 2019-08-12 DIAGNOSIS — Z794 Long term (current) use of insulin: Secondary | ICD-10-CM | POA: Diagnosis not present

## 2019-08-12 DIAGNOSIS — E785 Hyperlipidemia, unspecified: Secondary | ICD-10-CM | POA: Diagnosis not present

## 2019-08-12 DIAGNOSIS — E1140 Type 2 diabetes mellitus with diabetic neuropathy, unspecified: Secondary | ICD-10-CM | POA: Diagnosis not present

## 2019-08-12 DIAGNOSIS — E1165 Type 2 diabetes mellitus with hyperglycemia: Secondary | ICD-10-CM | POA: Diagnosis not present

## 2019-09-01 DIAGNOSIS — M7522 Bicipital tendinitis, left shoulder: Secondary | ICD-10-CM | POA: Diagnosis not present

## 2019-09-15 DIAGNOSIS — E1065 Type 1 diabetes mellitus with hyperglycemia: Secondary | ICD-10-CM | POA: Diagnosis not present

## 2019-09-15 DIAGNOSIS — Z6834 Body mass index (BMI) 34.0-34.9, adult: Secondary | ICD-10-CM | POA: Diagnosis not present

## 2019-09-15 DIAGNOSIS — E6609 Other obesity due to excess calories: Secondary | ICD-10-CM | POA: Diagnosis not present

## 2019-09-29 ENCOUNTER — Other Ambulatory Visit: Payer: Self-pay | Admitting: Orthopedic Surgery

## 2019-09-29 DIAGNOSIS — M7522 Bicipital tendinitis, left shoulder: Secondary | ICD-10-CM | POA: Diagnosis not present

## 2019-09-29 DIAGNOSIS — M25512 Pain in left shoulder: Secondary | ICD-10-CM

## 2019-10-18 ENCOUNTER — Other Ambulatory Visit: Payer: Self-pay | Admitting: Orthopedic Surgery

## 2019-10-19 ENCOUNTER — Ambulatory Visit
Admission: RE | Admit: 2019-10-19 | Discharge: 2019-10-19 | Disposition: A | Discharge status: Home / Self Care | Payer: BC Managed Care – PPO | Source: Ambulatory Visit | Attending: Orthopedic Surgery | Admitting: Orthopedic Surgery

## 2019-10-19 ENCOUNTER — Other Ambulatory Visit: Payer: Self-pay

## 2019-10-19 DIAGNOSIS — M75122 Complete rotator cuff tear or rupture of left shoulder, not specified as traumatic: Secondary | ICD-10-CM | POA: Diagnosis not present

## 2019-10-19 DIAGNOSIS — S46012A Strain of muscle(s) and tendon(s) of the rotator cuff of left shoulder, initial encounter: Secondary | ICD-10-CM | POA: Diagnosis not present

## 2019-10-19 DIAGNOSIS — M25512 Pain in left shoulder: Secondary | ICD-10-CM

## 2019-10-21 DIAGNOSIS — Z23 Encounter for immunization: Secondary | ICD-10-CM | POA: Diagnosis not present

## 2019-10-22 ENCOUNTER — Encounter: Payer: BC Managed Care – PPO | Attending: Family Medicine | Admitting: Dietician

## 2019-10-22 ENCOUNTER — Telehealth: Payer: Self-pay | Admitting: Dietician

## 2019-10-22 NOTE — Telephone Encounter (Signed)
Called regarding missed nutrition appointment today.   Left number for him to call to reschedule.  Antonieta Iba, RD, LDN, CDE

## 2019-11-12 DIAGNOSIS — Z23 Encounter for immunization: Secondary | ICD-10-CM | POA: Diagnosis not present

## 2019-11-29 DIAGNOSIS — E785 Hyperlipidemia, unspecified: Secondary | ICD-10-CM | POA: Diagnosis not present

## 2019-11-29 DIAGNOSIS — E1169 Type 2 diabetes mellitus with other specified complication: Secondary | ICD-10-CM | POA: Diagnosis not present

## 2019-11-29 DIAGNOSIS — Z794 Long term (current) use of insulin: Secondary | ICD-10-CM | POA: Diagnosis not present

## 2019-11-29 DIAGNOSIS — E1165 Type 2 diabetes mellitus with hyperglycemia: Secondary | ICD-10-CM | POA: Diagnosis not present

## 2019-11-29 DIAGNOSIS — E1140 Type 2 diabetes mellitus with diabetic neuropathy, unspecified: Secondary | ICD-10-CM | POA: Diagnosis not present

## 2019-12-17 ENCOUNTER — Other Ambulatory Visit: Payer: Self-pay

## 2019-12-17 ENCOUNTER — Encounter (HOSPITAL_BASED_OUTPATIENT_CLINIC_OR_DEPARTMENT_OTHER): Payer: Self-pay | Admitting: *Deleted

## 2019-12-17 ENCOUNTER — Emergency Department (HOSPITAL_BASED_OUTPATIENT_CLINIC_OR_DEPARTMENT_OTHER)
Admission: EM | Admit: 2019-12-17 | Discharge: 2019-12-17 | Disposition: A | Discharge status: Home / Self Care | Payer: BC Managed Care – PPO | Attending: Emergency Medicine | Admitting: Emergency Medicine

## 2019-12-17 ENCOUNTER — Emergency Department (HOSPITAL_BASED_OUTPATIENT_CLINIC_OR_DEPARTMENT_OTHER): Payer: BC Managed Care – PPO

## 2019-12-17 DIAGNOSIS — Z79899 Other long term (current) drug therapy: Secondary | ICD-10-CM | POA: Diagnosis not present

## 2019-12-17 DIAGNOSIS — Z87891 Personal history of nicotine dependence: Secondary | ICD-10-CM | POA: Diagnosis not present

## 2019-12-17 DIAGNOSIS — I10 Essential (primary) hypertension: Secondary | ICD-10-CM | POA: Diagnosis not present

## 2019-12-17 DIAGNOSIS — E109 Type 1 diabetes mellitus without complications: Secondary | ICD-10-CM | POA: Insufficient documentation

## 2019-12-17 DIAGNOSIS — Z7982 Long term (current) use of aspirin: Secondary | ICD-10-CM | POA: Insufficient documentation

## 2019-12-17 DIAGNOSIS — J45909 Unspecified asthma, uncomplicated: Secondary | ICD-10-CM | POA: Diagnosis not present

## 2019-12-17 DIAGNOSIS — R1084 Generalized abdominal pain: Secondary | ICD-10-CM | POA: Diagnosis not present

## 2019-12-17 DIAGNOSIS — K4090 Unilateral inguinal hernia, without obstruction or gangrene, not specified as recurrent: Secondary | ICD-10-CM | POA: Insufficient documentation

## 2019-12-17 DIAGNOSIS — R197 Diarrhea, unspecified: Secondary | ICD-10-CM | POA: Diagnosis not present

## 2019-12-17 DIAGNOSIS — R109 Unspecified abdominal pain: Secondary | ICD-10-CM | POA: Diagnosis not present

## 2019-12-17 LAB — COMPREHENSIVE METABOLIC PANEL
ALT: 27 U/L (ref 0–44)
AST: 17 U/L (ref 15–41)
Albumin: 4.1 g/dL (ref 3.5–5.0)
Alkaline Phosphatase: 93 U/L (ref 38–126)
Anion gap: 11 (ref 5–15)
BUN: 12 mg/dL (ref 6–20)
CO2: 23 mmol/L (ref 22–32)
Calcium: 9.2 mg/dL (ref 8.9–10.3)
Chloride: 102 mmol/L (ref 98–111)
Creatinine, Ser: 0.89 mg/dL (ref 0.61–1.24)
GFR calc Af Amer: >60 mL/min (ref >60)
GFR calc non Af Amer: >60 mL/min (ref >60)
Glucose, Bld: 200 mg/dL — ABNORMAL HIGH (ref 70–99)
Potassium: 3.7 mmol/L (ref 3.5–5.1)
Sodium: 136 mmol/L (ref 135–145)
Total Bilirubin: 0.7 mg/dL (ref 0.3–1.2)
Total Protein: 7.5 g/dL (ref 6.5–8.1)

## 2019-12-17 LAB — CBC
HCT: 47.8 % (ref 39.0–52.0)
Hemoglobin: 16.1 g/dL (ref 13.0–17.0)
MCH: 29.9 pg (ref 26.0–34.0)
MCHC: 33.7 g/dL (ref 30.0–36.0)
MCV: 88.7 fL (ref 80.0–100.0)
Platelets: 332 10*3/uL (ref 150–400)
RBC: 5.39 MIL/uL (ref 4.22–5.81)
RDW: 13.2 % (ref 11.5–15.5)
WBC: 11.8 10*3/uL — ABNORMAL HIGH (ref 4.0–10.5)
nRBC: 0 % (ref 0.0–0.2)

## 2019-12-17 LAB — URINALYSIS, ROUTINE W REFLEX MICROSCOPIC
Bilirubin Urine: NEGATIVE
Glucose, UA: >=500 mg/dL — AB
Hgb urine dipstick: NEGATIVE
Ketones, ur: NEGATIVE mg/dL
Leukocytes,Ua: NEGATIVE
Nitrite: NEGATIVE
Protein, ur: NEGATIVE mg/dL
Specific Gravity, Urine: >1.03 — ABNORMAL HIGH (ref 1.005–1.030)
pH: 5.5 (ref 5.0–8.0)

## 2019-12-17 LAB — URINALYSIS, MICROSCOPIC (REFLEX)

## 2019-12-17 LAB — LIPASE, BLOOD: Lipase: 23 U/L (ref 11–51)

## 2019-12-17 LAB — CBG MONITORING, ED: Glucose-Capillary: 180 mg/dL — ABNORMAL HIGH (ref 70–99)

## 2019-12-17 MED ORDER — IOHEXOL 300 MG/ML  SOLN
100.0000 mL | Freq: Once | INTRAMUSCULAR | Status: AC | PRN
  Administered 2019-12-17: 100 mL via INTRAVENOUS

## 2019-12-17 MED ORDER — DICYCLOMINE HCL 20 MG PO TABS: 20.0000 mg | ORAL_TABLET | Freq: Two times a day (BID) | ORAL | 0 refills | Status: AC | PRN

## 2019-12-17 MED ORDER — SODIUM CHLORIDE 0.9 % IV BOLUS
1000.0000 mL | Freq: Once | INTRAVENOUS | Status: AC
  Administered 2019-12-17: 1000 mL via INTRAVENOUS

## 2019-12-17 MED ORDER — DICYCLOMINE HCL 10 MG PO CAPS
10.0000 mg | ORAL_CAPSULE | Freq: Once | ORAL | Status: AC
  Administered 2019-12-17: 10 mg via ORAL
  Filled 2019-12-17: qty 1

## 2019-12-17 MED ORDER — HYDROMORPHONE HCL 1 MG/ML IJ SOLN
1.0000 mg | Freq: Once | INTRAMUSCULAR | Status: AC
  Administered 2019-12-17: 1 mg via INTRAVENOUS
  Filled 2019-12-17: qty 1

## 2019-12-17 MED ORDER — MORPHINE SULFATE (PF) 4 MG/ML IV SOLN
4.0000 mg | Freq: Once | INTRAVENOUS | Status: AC
  Administered 2019-12-17: 4 mg via INTRAVENOUS
  Filled 2019-12-17: qty 1

## 2019-12-17 MED ORDER — ONDANSETRON HCL 4 MG/2ML IJ SOLN
4.0000 mg | Freq: Once | INTRAMUSCULAR | Status: AC
  Administered 2019-12-17: 4 mg via INTRAVENOUS
  Filled 2019-12-17: qty 2

## 2019-12-17 NOTE — Discharge Instructions (Addendum)
You were seen in the emergency department for evaluation of crampy abdominal pain diarrhea.  You had blood work and a CAT scan of your abdomen pelvis that did not show any serious abnormalities.  Your CAT scan did showed a right inguinal hernia.  We are prescribing this medication to help with the cramps.  You can also try some over-the-counter diarrhea medicine.  Please drink plenty of fluids.  Follow-up with your doctor and return to the emergency department for any worsening or concerning symptoms

## 2019-12-17 NOTE — ED Triage Notes (Signed)
Abdominal pain and diarrhea since yesterday. Pt states he has a hernia. Pressure builds in the area followed by severe diarrhea.

## 2019-12-17 NOTE — ED Notes (Signed)
Sprite and crackers given  

## 2019-12-17 NOTE — ED Notes (Signed)
Patient transported to CT 

## 2019-12-17 NOTE — ED Provider Notes (Signed)
[facility] EMERGENCY DEPARTMENT Provider Note   CSN: [id number] Arrival date & time: 12/17/19  1224     History Chief Complaint  Patient presents with  . Abdominal Pain  . Diarrhea    Levi Meyers is a 54 y.o. male.  He has a history of diabetes hypertension.  Past surgical history of appendectomy and cholecystectomy.  Complaining of midepigastric abdominal pain starting yesterday.  Severe in nature.  Associated with explosive diarrhea 2 episodes.  Nonbloody.  Nausea no vomiting.  No fevers or chills.  The history is provided by the patient.  Abdominal Pain Pain location:  Periumbilical Pain quality: cramping   Pain radiates to:  Does not radiate Pain severity:  Severe Onset quality:  Gradual Duration:  24 hours Timing:  Constant Progression:  Unchanged Chronicity:  New Context: not recent travel and not trauma   Relieved by:  None tried Worsened by:  Nothing Ineffective treatments:  None tried Associated symptoms: diarrhea and nausea   Associated symptoms: no chest pain, no constipation, no cough, no dysuria, no fever, no hematemesis, no hematochezia, no hematuria, no shortness of breath and no sore throat        Past Medical History:  Diagnosis Date  . Asthma   . Depression   . Diabetes mellitus   . Diabetes mellitus without complication ([place])   . GERD (gastroesophageal reflux disease)   . High cholesterol   . History of kidney stones   . Hyperlipidemia   . Hypertension   . Kidney disease    stones  . Kidney stone   . Noncompliance with diabetes treatment   . Sleep apnea    cpap    Patient Active Problem List   Diagnosis Date Noted  . Cervical spondylosis with radiculopathy 06/24/2014  . OSA on CPAP 02/18/2013  . DOE (dyspnea on exertion) 01/07/2013  . Tachycardia 01/07/2013  . Edema extremities 01/07/2013  . Bipolar affective disorder ([place]) 03/31/2012  . Type 1 diabetes mellitus with hypoglycemia ([place]) 11/22/2011  . Amblyopia 11/22/2011   . Bronchitis, acute 11/03/2011  . DIABETES MELLITUS, TYPE I 04/28/2008  . HYPERLIPIDEMIA 04/28/2008  . RHEUMATIC FEVER 04/28/2008  . HYPERTENSION 04/28/2008  . RENAL CALCULUS, HX OF 04/28/2008  . CHICKENPOX, HX OF 04/28/2008    Past Surgical History:  Procedure Laterality Date  . ANTERIOR CERVICAL DECOMP/DISCECTOMY FUSION N/A 06/24/2014   Procedure: Cervical five-six anterior cervical decompression with fusion interbody prosthesis with plating and bonegraft;  Surgeon: Consuella Lose, MD;  Location: [facility];  Service: Neurosurgery;  Laterality: N/A;  Cervical five-six anterior cervical decompression with fusion interbody prosthesis with plating and bonegraft  . APPENDECTOMY  2004  . APPENDECTOMY    . CARDIAC CATHETERIZATION     Dr. Verl Blalock ([place])  . CERVICAL SPINE SURGERY    . CHOLECYSTECTOMY  2004  . CHOLECYSTECTOMY    . COLONOSCOPY N/A 09/08/2012   Procedure: COLONOSCOPY;  Surgeon: Leighton Ruff, MD;  Location: [facility];  Service: Endoscopy;  Laterality: N/A;  . EYE SURGERY  1970  . EYE SURGERY    . HERNIA REPAIR    . KNEE CARTILAGE SURGERY    . LEG SURGERY    . SKIN GRAFT  1974  . SKIN GRAFT FULL THICKNESS LEG    . STRABISMUS SURGERY         Family History  Problem Relation Age of Onset  . Diabetes Mother   . Heart attack Mother   . Stroke Mother   . Diabetes Father   .  Heart disease Other        Parent  . Heart disease Other        Grandparents    Social History   Tobacco Use  . Smoking status: Former Smoker    Years: 4.00    Types: Cigarettes  . Smokeless tobacco: Former Network engineer Use Topics  . Alcohol use: No  . Drug use: No    Home Medications Prior to Admission medications   Medication Sig Start Date End Date Taking? Authorizing Provider  aspirin 81 MG tablet Take 81 mg by mouth daily.    [provider]  atorvastatin (LIPITOR) 20 MG tablet Take 20 mg by mouth daily.    [provider]  buPROPion (WELLBUTRIN XL)  300 MG 24 hr tablet Take 300 mg by mouth daily.     [provider]  diclofenac Sodium (VOLTAREN) 1 % GEL Apply 4 g topically 4 (four) times daily. 07/21/19   Malvin Johns, MD  Dulaglutide (TRULICITY) 1.5 0000000 SOPN Inject 1.5 mg into the skin every Monday.     [provider]  Fluocinonide Emulsified Base 0.05 % CREA Apply 1 application topically daily as needed (for affected area(s)).  07/21/19   [provider]  gabapentin (NEURONTIN) 300 MG capsule Take 600 mg by mouth 2 (two) times daily.    [provider]  guaiFENesin (DIABETIC TUSSIN EX) 100 MG/5ML liquid Take 200 mg by mouth 3 (three) times daily as needed for cough.    [provider]  HYDROcodone-acetaminophen (NORCO/VICODIN) 5-325 MG tablet Take 1-2 tablets by mouth every 6 (six) hours as needed. 07/21/19   Malvin Johns, MD  ibuprofen (ADVIL,MOTRIN) 200 MG tablet Take 400 mg by mouth 2 (two) times daily as needed (pain).    [provider]  insulin aspart (NOVOLOG FLEXPEN) 100 UNIT/ML FlexPen Inject 30 Units into the skin daily before supper.     [provider]  Insulin Glargine (LANTUS SOLOSTAR) 100 UNIT/ML Solostar Pen Inject 60 Units into the skin 2 (two) times daily.     [provider]  lisinopril (PRINIVIL,ZESTRIL) 5 MG tablet Take 5 mg by mouth daily.    [provider]  ondansetron (ZOFRAN) 4 MG tablet Take 1 tablet (4 mg total) by mouth every 6 (six) hours. 07/28/19   Triplett, Tammy, PA-C  traZODone (DESYREL) 100 MG tablet Take 100 mg by mouth at bedtime.    [provider]    Allergies    Byetta 10 mcg pen [exenatide], Codeine, Codeine, Invokana [canagliflozin], and Victoza [liraglutide]  Review of Systems   Review of Systems  Constitutional: Negative for fever.  HENT: Negative for sore throat.   Eyes: Negative for visual disturbance.  Respiratory: Negative for cough and shortness of breath.   Cardiovascular: Negative for  chest pain.  Gastrointestinal: Positive for abdominal pain, diarrhea and nausea. Negative for constipation, hematemesis and hematochezia.  Genitourinary: Negative for dysuria and hematuria.  Musculoskeletal: Negative for neck pain.  Skin: Negative for rash.  Neurological: Negative for headaches.    Physical Exam Updated Vital Signs BP (!) 139/93   Pulse (!) 101   Temp 98.3 F (36.8 C) (Oral)   Resp 18   Ht 5\' 5"  (1.651 m)   Wt 87.5 kg   SpO2 95%   BMI 32.12 kg/m   Physical Exam Vitals and nursing note reviewed.  Constitutional:      Appearance: He is well-developed.  HENT:     Head: Normocephalic and atraumatic.  Eyes:     Conjunctiva/sclera: Conjunctivae normal.  Cardiovascular:     Rate and Rhythm: Normal rate and regular rhythm.     Heart sounds: No murmur.  Pulmonary:     Effort: Pulmonary effort is normal. No respiratory distress.     Breath sounds: Normal breath sounds.  Abdominal:     Palpations: Abdomen is soft.     Tenderness: There is abdominal tenderness in the epigastric area. There is no guarding or rebound.     Hernia: A hernia is present. Hernia is present in the umbilical area (reducable, tender).  Musculoskeletal:        General: Normal range of motion.     Cervical back: Neck supple.  Skin:    General: Skin is warm and dry.     Capillary Refill: Capillary refill takes less than 2 seconds.  Neurological:     General: No focal deficit present.     Mental Status: He is alert.     ED Results / Procedures / Treatments   Labs (all labs ordered are listed, but only abnormal results are displayed) Labs Reviewed  COMPREHENSIVE METABOLIC PANEL - Abnormal; Notable for the following components:      Result Value   Glucose, Bld 200 (*)    All other components within normal limits  CBC - Abnormal; Notable for the following components:   WBC 11.8 (*)    All other components within normal limits  URINALYSIS, ROUTINE W REFLEX MICROSCOPIC - Abnormal;  Notable for the following components:   Specific Gravity, Urine >1.030 (*)    Glucose, UA >=500 (*)    All other components within normal limits  URINALYSIS, MICROSCOPIC (REFLEX) - Abnormal; Notable for the following components:   Bacteria, UA FEW (*)    All other components within normal limits  CBG MONITORING, ED - Abnormal; Notable for the following components:   Glucose-Capillary 180 (*)    All other components within normal limits  LIPASE, BLOOD    EKG None  Radiology CT Abdomen Pelvis W Contrast  Result Date: 12/17/2019 CLINICAL DATA:  Acute periumbilical abdominal pain. EXAM: CT ABDOMEN AND PELVIS WITH CONTRAST TECHNIQUE: Multidetector CT imaging of the abdomen and pelvis was performed using the standard protocol following bolus administration of intravenous contrast. CONTRAST:  172mL OMNIPAQUE IOHEXOL 300 MG/ML  SOLN COMPARISON:  Nov 22, 2017. FINDINGS: Lower chest: Visualized lung bases are unremarkable. Continued presence of pericardial calcifications is noted. Hepatobiliary: No focal liver abnormality is seen. Status post cholecystectomy. No biliary dilatation. Pancreas: Unremarkable. No pancreatic ductal dilatation or surrounding inflammatory changes. Spleen: Normal in size without focal abnormality. Adrenals/Urinary Tract: Adrenal glands are unremarkable. Minimal bilateral nonobstructive nephrolithiasis is noted. No hydronephrosis or renal obstruction is noted. Bladder is unremarkable. Stomach/Bowel: The stomach appears normal. Status post appendectomy. No evidence of bowel obstruction or inflammation. Vascular/Lymphatic: No significant vascular findings are present. No enlarged abdominal or pelvic lymph nodes. Reproductive: Prostate is unremarkable. Other: Small fat containing right inguinal hernia is noted. No ascites is noted. Musculoskeletal: No acute or significant osseous findings. IMPRESSION: 1. Continued presence of pericardial calcifications is noted. 2. Minimal bilateral  nonobstructive nephrolithiasis. No hydronephrosis or renal obstruction is noted. 3. Small fat containing right inguinal hernia. Electronically Signed   By: Marijo Conception M.D.   On: 12/17/2019 15:52    Procedures Procedures (including critical care time)  Medications Ordered in ED Medications  sodium chloride 0.9 % bolus 1,000 mL (0 mLs Intravenous Stopped 12/17/19 1632)  ondansetron (ZOFRAN)  injection 4 mg (4 mg Intravenous Given 12/17/19 1530)  morphine 4 MG/ML injection 4 mg (4 mg Intravenous Given 12/17/19 1531)  iohexol (OMNIPAQUE) 300 MG/ML solution 100 mL (100 mLs Intravenous Contrast Given 12/17/19 1517)  dicyclomine (BENTYL) capsule 10 mg (10 mg Oral Given 12/17/19 1629)  HYDROmorphone (DILAUDID) injection 1 mg (1 mg Intravenous Given 12/17/19 1630)    ED Course  I have reviewed the triage vital signs and the nursing notes.  Pertinent labs & imaging results that were available during my care of the patient were reviewed by me and considered in my medical decision making (see chart for details).  Clinical Course as of Dec 18 1355  Fri Dec 17, 2019  1556  IMPRESSION: 1. Continued presence of pericardial calcifications is noted. 2. Minimal bilateral nonobstructive nephrolithiasis. No hydronephrosis or renal obstruction is noted. 3. Small fat containing right inguinal hernia.     [MB]  1710 Reviewed results with patient and his wife.  He said the last dose of medication seem to help more.  Will observe a little while longer and if continuing to improve will discharge soon.   [MB]  [id number] Patient has been able to eat and drink something here.  Will discharge.  Return instructions discussed.   [MB]    Clinical Course User Index [MB] Hayden Rasmussen, MD   MDM Rules/Calculators/A&P                     This patient complains of abdominal pain and diarrhea; this involves an extensive number of treatment Options and is a complaint that carries with it a high risk of complications  and Morbidity. The differential includes gastroenteritis colitis diverticulitis obstruction incarcerated hernia renal colic  I ordered, reviewed and interpreted labs, which included CBC with mildly elevated white count, normal chemistry other than elevated glucose, urine no signs of infection, normal LFTs and lipase. I ordered medication IV fluids pain medication Bentyl nausea medication I ordered imaging studies which included CT abdomen and pelvis and I independently    visualized and interpreted imaging which showed no acute findings to explain his left lower quadrant pain. Additional history obtained from patient's wife Previous records obtained and reviewed in epic  After the interventions stated above, I reevaluated the patient and found patient symptoms to be improved.  Diarrhea here.  We will have him do symptomatic treatment.  Return instructions discussed.  Tolerating p.o. here   Final Clinical Impression(s) / ED Diagnoses Final diagnoses:  Generalized abdominal pain  Diarrhea, unspecified type  Right inguinal hernia    Rx / DC Orders ED Discharge Orders         Ordered    dicyclomine (BENTYL) 20 MG tablet  2 times daily PRN     12/17/19 1809           Hayden Rasmussen, MD 12/18/19 1359

## 2020-02-03 ENCOUNTER — Ambulatory Visit: Payer: BC Managed Care – PPO | Admitting: Dietician

## 2020-02-24 ENCOUNTER — Emergency Department (HOSPITAL_BASED_OUTPATIENT_CLINIC_OR_DEPARTMENT_OTHER): Payer: BC Managed Care – PPO

## 2020-02-24 ENCOUNTER — Other Ambulatory Visit: Payer: Self-pay

## 2020-02-24 ENCOUNTER — Encounter (HOSPITAL_BASED_OUTPATIENT_CLINIC_OR_DEPARTMENT_OTHER): Payer: Self-pay | Admitting: *Deleted

## 2020-02-24 ENCOUNTER — Emergency Department (HOSPITAL_BASED_OUTPATIENT_CLINIC_OR_DEPARTMENT_OTHER)
Admission: EM | Admit: 2020-02-24 | Discharge: 2020-02-24 | Disposition: A | Discharge status: Home / Self Care | Payer: BC Managed Care – PPO | Attending: Emergency Medicine | Admitting: Emergency Medicine

## 2020-02-24 DIAGNOSIS — I517 Cardiomegaly: Secondary | ICD-10-CM | POA: Diagnosis not present

## 2020-02-24 DIAGNOSIS — Y92481 Parking lot as the place of occurrence of the external cause: Secondary | ICD-10-CM | POA: Insufficient documentation

## 2020-02-24 DIAGNOSIS — R519 Headache, unspecified: Secondary | ICD-10-CM

## 2020-02-24 DIAGNOSIS — H539 Unspecified visual disturbance: Secondary | ICD-10-CM | POA: Diagnosis not present

## 2020-02-24 DIAGNOSIS — E1065 Type 1 diabetes mellitus with hyperglycemia: Secondary | ICD-10-CM | POA: Diagnosis not present

## 2020-02-24 DIAGNOSIS — Z87891 Personal history of nicotine dependence: Secondary | ICD-10-CM | POA: Insufficient documentation

## 2020-02-24 DIAGNOSIS — S0990XA Unspecified injury of head, initial encounter: Secondary | ICD-10-CM | POA: Diagnosis not present

## 2020-02-24 DIAGNOSIS — J45909 Unspecified asthma, uncomplicated: Secondary | ICD-10-CM | POA: Diagnosis not present

## 2020-02-24 DIAGNOSIS — I10 Essential (primary) hypertension: Secondary | ICD-10-CM | POA: Diagnosis not present

## 2020-02-24 DIAGNOSIS — Y939 Activity, unspecified: Secondary | ICD-10-CM | POA: Diagnosis not present

## 2020-02-24 DIAGNOSIS — Y999 Unspecified external cause status: Secondary | ICD-10-CM | POA: Insufficient documentation

## 2020-02-24 DIAGNOSIS — Z794 Long term (current) use of insulin: Secondary | ICD-10-CM | POA: Insufficient documentation

## 2020-02-24 DIAGNOSIS — Z7982 Long term (current) use of aspirin: Secondary | ICD-10-CM | POA: Insufficient documentation

## 2020-02-24 DIAGNOSIS — R42 Dizziness and giddiness: Secondary | ICD-10-CM | POA: Insufficient documentation

## 2020-02-24 DIAGNOSIS — R55 Syncope and collapse: Secondary | ICD-10-CM | POA: Diagnosis not present

## 2020-02-24 DIAGNOSIS — Z79899 Other long term (current) drug therapy: Secondary | ICD-10-CM | POA: Diagnosis not present

## 2020-02-24 LAB — COMPREHENSIVE METABOLIC PANEL
ALT: 41 U/L (ref 0–44)
AST: 24 U/L (ref 15–41)
Albumin: 4.1 g/dL (ref 3.5–5.0)
Alkaline Phosphatase: 95 U/L (ref 38–126)
Anion gap: 12 (ref 5–15)
BUN: 13 mg/dL (ref 6–20)
CO2: 25 mmol/L (ref 22–32)
Calcium: 9.3 mg/dL (ref 8.9–10.3)
Chloride: 101 mmol/L (ref 98–111)
Creatinine, Ser: 1.09 mg/dL (ref 0.61–1.24)
GFR calc Af Amer: >60 mL/min (ref >60)
GFR calc non Af Amer: >60 mL/min (ref >60)
Glucose, Bld: 201 mg/dL — ABNORMAL HIGH (ref 70–99)
Potassium: 3.9 mmol/L (ref 3.5–5.1)
Sodium: 138 mmol/L (ref 135–145)
Total Bilirubin: 0.9 mg/dL (ref 0.3–1.2)
Total Protein: 7.5 g/dL (ref 6.5–8.1)

## 2020-02-24 LAB — CBC WITH DIFFERENTIAL/PLATELET
Abs Immature Granulocytes: 0.03 10*3/uL (ref 0.00–0.07)
Basophils Absolute: 0.1 10*3/uL (ref 0.0–0.1)
Basophils Relative: 1 %
Eosinophils Absolute: 0.2 10*3/uL (ref 0.0–0.5)
Eosinophils Relative: 2 %
HCT: 45.9 % (ref 39.0–52.0)
Hemoglobin: 15.2 g/dL (ref 13.0–17.0)
Immature Granulocytes: 0 %
Lymphocytes Relative: 31 %
Lymphs Abs: 3.2 10*3/uL (ref 0.7–4.0)
MCH: 29.8 pg (ref 26.0–34.0)
MCHC: 33.1 g/dL (ref 30.0–36.0)
MCV: 90 fL (ref 80.0–100.0)
Monocytes Absolute: 1 10*3/uL (ref 0.1–1.0)
Monocytes Relative: 10 %
Neutro Abs: 5.7 10*3/uL (ref 1.7–7.7)
Neutrophils Relative %: 56 %
Platelets: 311 10*3/uL (ref 150–400)
RBC: 5.1 MIL/uL (ref 4.22–5.81)
RDW: 13.5 % (ref 11.5–15.5)
WBC: 10.3 10*3/uL (ref 4.0–10.5)
nRBC: 0 % (ref 0.0–0.2)

## 2020-02-24 LAB — CBG MONITORING, ED: Glucose-Capillary: 204 mg/dL — ABNORMAL HIGH (ref 70–99)

## 2020-02-24 MED ORDER — ONDANSETRON HCL 4 MG/2ML IJ SOLN
4.0000 mg | Freq: Once | INTRAMUSCULAR | Status: AC
  Administered 2020-02-24: 4 mg via INTRAVENOUS
  Filled 2020-02-24: qty 2

## 2020-02-24 MED ORDER — HYDROMORPHONE HCL 1 MG/ML IJ SOLN
1.0000 mg | Freq: Once | INTRAMUSCULAR | Status: AC
  Administered 2020-02-24: 1 mg via INTRAVENOUS
  Filled 2020-02-24: qty 1

## 2020-02-24 MED ORDER — DIPHENHYDRAMINE HCL 50 MG/ML IJ SOLN
25.0000 mg | Freq: Once | INTRAMUSCULAR | Status: AC
  Administered 2020-02-24: 25 mg via INTRAVENOUS
  Filled 2020-02-24: qty 1

## 2020-02-24 MED ORDER — METOCLOPRAMIDE HCL 5 MG/ML IJ SOLN
10.0000 mg | Freq: Once | INTRAMUSCULAR | Status: AC
  Administered 2020-02-24: 10 mg via INTRAVENOUS
  Filled 2020-02-24: qty 2

## 2020-02-24 MED ORDER — SODIUM CHLORIDE 0.9 % IV SOLN: INTRAVENOUS | Status: DC

## 2020-02-24 MED ORDER — DEXAMETHASONE SODIUM PHOSPHATE 10 MG/ML IJ SOLN
10.0000 mg | Freq: Once | INTRAMUSCULAR | Status: AC
  Administered 2020-02-24: 10 mg via INTRAVENOUS
  Filled 2020-02-24: qty 1

## 2020-02-24 MED ORDER — KETOROLAC TROMETHAMINE 30 MG/ML IJ SOLN
30.0000 mg | Freq: Once | INTRAMUSCULAR | Status: AC
  Administered 2020-02-24: 30 mg via INTRAVENOUS
  Filled 2020-02-24: qty 1

## 2020-02-24 MED ORDER — SODIUM CHLORIDE 0.9 % IV BOLUS
1000.0000 mL | Freq: Once | INTRAVENOUS | Status: AC
  Administered 2020-02-24: 1000 mL via INTRAVENOUS

## 2020-02-24 NOTE — ED Notes (Signed)
Patient transported to CT 
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
## 2020-02-24 NOTE — Discharge Instructions (Signed)
Rest tomorrow in a dark room.  Work note provided.  Of headache read turns need to follow-up with your primary care doctor will probably need MRI brain.

## 2020-02-28 DIAGNOSIS — E6609 Other obesity due to excess calories: Secondary | ICD-10-CM | POA: Diagnosis not present

## 2020-02-28 DIAGNOSIS — Z6832 Body mass index (BMI) 32.0-32.9, adult: Secondary | ICD-10-CM | POA: Diagnosis not present

## 2020-02-28 DIAGNOSIS — R55 Syncope and collapse: Secondary | ICD-10-CM | POA: Diagnosis not present

## 2020-02-28 DIAGNOSIS — E1065 Type 1 diabetes mellitus with hyperglycemia: Secondary | ICD-10-CM | POA: Diagnosis not present

## 2020-02-28 DIAGNOSIS — F3110 Bipolar disorder, current episode manic without psychotic features, unspecified: Secondary | ICD-10-CM | POA: Diagnosis not present

## 2020-03-29 DIAGNOSIS — E1140 Type 2 diabetes mellitus with diabetic neuropathy, unspecified: Secondary | ICD-10-CM | POA: Diagnosis not present

## 2020-03-29 DIAGNOSIS — E785 Hyperlipidemia, unspecified: Secondary | ICD-10-CM | POA: Diagnosis not present

## 2020-03-29 DIAGNOSIS — E1169 Type 2 diabetes mellitus with other specified complication: Secondary | ICD-10-CM | POA: Diagnosis not present

## 2020-03-29 DIAGNOSIS — Z794 Long term (current) use of insulin: Secondary | ICD-10-CM | POA: Diagnosis not present

## 2020-03-29 DIAGNOSIS — E1165 Type 2 diabetes mellitus with hyperglycemia: Secondary | ICD-10-CM | POA: Diagnosis not present

## 2020-06-03 ENCOUNTER — Telehealth: Payer: BC Managed Care – PPO | Admitting: Emergency Medicine

## 2020-06-03 DIAGNOSIS — R0981 Nasal congestion: Secondary | ICD-10-CM

## 2020-06-03 MED ORDER — PROMETHAZINE-DM 6.25-15 MG/5ML PO SYRP
5.0000 mL | ORAL_SOLUTION | Freq: Four times a day (QID) | ORAL | 0 refills | Status: DC | PRN
Start: 2020-06-03 — End: 2020-07-03

## 2020-06-03 MED ORDER — AMOXICILLIN-POT CLAVULANATE 875-125 MG PO TABS
1.0000 | ORAL_TABLET | Freq: Two times a day (BID) | ORAL | 0 refills | Status: DC
Start: 2020-06-03 — End: 2020-07-03

## 2020-06-03 NOTE — Progress Notes (Signed)
Time spent: 10 min  We are sorry that you are not feeling well.  Here is how we plan to help!  Based on what you have shared with me it looks like you have sinusitis.  Sinusitis is inflammation and infection in the sinus cavities of the head.  Based on your presentation I believe you most likely have Acute Bacterial Sinusitis.  This is an infection caused by bacteria and is treated with antibiotics. I have prescribed Augmentin 875mg /125mg  one tablet twice daily with food, for 7 days. You may use an oral decongestant such as Mucinex D or if you have glaucoma or high blood pressure use plain Mucinex. Saline nasal spray help and can safely be used as often as needed for congestion.  If you develop worsening sinus pain, fever or notice severe headache and vision changes, or if symptoms are not better after completion of antibiotic, please schedule an appointment with a health care provider.    I have also prescribed promethazine phenylephrine syrup which helps with upper respiratory congestion, inflammation.   Sinus infections are not as easily transmitted as other respiratory infection, however we still recommend that you avoid close contact with loved ones, especially the very young and elderly.  Remember to wash your hands thoroughly throughout the day as this is the number one way to prevent the spread of infection!  Home Care:  Only take medications as instructed by your medical team.  Complete the entire course of an antibiotic.  Do not take these medications with alcohol.  A steam or ultrasonic humidifier can help congestion.  You can place a towel over your head and breathe in the steam from hot water coming from a faucet.  Avoid close contacts especially the very young and the elderly.  Cover your mouth when you cough or sneeze.  Always remember to wash your hands.  Get Help Right Away If:  You develop worsening fever or sinus pain.  You develop a severe head ache or visual  changes.  Your symptoms persist after you have completed your treatment plan.  Make sure you  Understand these instructions.  Will watch your condition.  Will get help right away if you are not doing well or get worse.  Your e-visit answers were reviewed by a board certified advanced clinical practitioner to complete your personal care plan.  Depending on the condition, your plan could have included both over the counter or prescription medications.  If there is a problem please reply  once you have received a response from your provider.  Your safety is important to [place].  If you have drug allergies check your prescription carefully.    You can use MyChart to ask questions about today's visit, request a non-urgent call back, or ask for a work or school excuse for 24 hours related to this e-Visit. If it has been greater than 24 hours you will need to follow up with your provider, or enter a new e-Visit to address those concerns.  You will get an e-mail in the next two days asking about your experience.  I hope that your e-visit has been valuable and will speed your recovery. Thank you for using e-visits.

## 2020-06-28 DIAGNOSIS — E1169 Type 2 diabetes mellitus with other specified complication: Secondary | ICD-10-CM | POA: Diagnosis not present

## 2020-06-28 DIAGNOSIS — Z794 Long term (current) use of insulin: Secondary | ICD-10-CM | POA: Diagnosis not present

## 2020-06-28 DIAGNOSIS — E1140 Type 2 diabetes mellitus with diabetic neuropathy, unspecified: Secondary | ICD-10-CM | POA: Diagnosis not present

## 2020-06-28 DIAGNOSIS — E785 Hyperlipidemia, unspecified: Secondary | ICD-10-CM | POA: Diagnosis not present

## 2020-06-28 DIAGNOSIS — E1165 Type 2 diabetes mellitus with hyperglycemia: Secondary | ICD-10-CM | POA: Diagnosis not present

## 2020-07-03 ENCOUNTER — Other Ambulatory Visit: Payer: Self-pay

## 2020-07-03 ENCOUNTER — Observation Stay (HOSPITAL_BASED_OUTPATIENT_CLINIC_OR_DEPARTMENT_OTHER)
Admission: EM | Admit: 2020-07-03 | Discharge: 2020-07-04 | Disposition: A | Discharge status: Home / Self Care | Payer: BC Managed Care – PPO | Attending: Internal Medicine | Admitting: Internal Medicine

## 2020-07-03 ENCOUNTER — Encounter (HOSPITAL_BASED_OUTPATIENT_CLINIC_OR_DEPARTMENT_OTHER): Payer: Self-pay | Admitting: Emergency Medicine

## 2020-07-03 ENCOUNTER — Emergency Department (HOSPITAL_BASED_OUTPATIENT_CLINIC_OR_DEPARTMENT_OTHER): Payer: BC Managed Care – PPO

## 2020-07-03 ENCOUNTER — Encounter (HOSPITAL_COMMUNITY)
Admission: EM | Disposition: A | Discharge status: Home / Self Care | Payer: Self-pay | Source: Home / Self Care | Attending: Internal Medicine

## 2020-07-03 DIAGNOSIS — R079 Chest pain, unspecified: Secondary | ICD-10-CM | POA: Diagnosis present

## 2020-07-03 DIAGNOSIS — I259 Chronic ischemic heart disease, unspecified: Secondary | ICD-10-CM

## 2020-07-03 DIAGNOSIS — I2510 Atherosclerotic heart disease of native coronary artery without angina pectoris: Secondary | ICD-10-CM | POA: Diagnosis not present

## 2020-07-03 DIAGNOSIS — Z20822 Contact with and (suspected) exposure to covid-19: Secondary | ICD-10-CM | POA: Insufficient documentation

## 2020-07-03 DIAGNOSIS — Z87891 Personal history of nicotine dependence: Secondary | ICD-10-CM | POA: Insufficient documentation

## 2020-07-03 DIAGNOSIS — J90 Pleural effusion, not elsewhere classified: Secondary | ICD-10-CM | POA: Diagnosis not present

## 2020-07-03 DIAGNOSIS — Z7982 Long term (current) use of aspirin: Secondary | ICD-10-CM | POA: Diagnosis not present

## 2020-07-03 DIAGNOSIS — I10 Essential (primary) hypertension: Secondary | ICD-10-CM | POA: Diagnosis not present

## 2020-07-03 DIAGNOSIS — Z794 Long term (current) use of insulin: Secondary | ICD-10-CM | POA: Diagnosis not present

## 2020-07-03 DIAGNOSIS — E109 Type 1 diabetes mellitus without complications: Secondary | ICD-10-CM | POA: Insufficient documentation

## 2020-07-03 DIAGNOSIS — R072 Precordial pain: Secondary | ICD-10-CM

## 2020-07-03 DIAGNOSIS — I249 Acute ischemic heart disease, unspecified: Principal | ICD-10-CM

## 2020-07-03 DIAGNOSIS — Z79899 Other long term (current) drug therapy: Secondary | ICD-10-CM | POA: Diagnosis not present

## 2020-07-03 DIAGNOSIS — J45909 Unspecified asthma, uncomplicated: Secondary | ICD-10-CM | POA: Diagnosis not present

## 2020-07-03 DIAGNOSIS — J9811 Atelectasis: Secondary | ICD-10-CM | POA: Diagnosis not present

## 2020-07-03 DIAGNOSIS — I200 Unstable angina: Secondary | ICD-10-CM

## 2020-07-03 DIAGNOSIS — R0789 Other chest pain: Secondary | ICD-10-CM | POA: Diagnosis not present

## 2020-07-03 DIAGNOSIS — E119 Type 2 diabetes mellitus without complications: Secondary | ICD-10-CM | POA: Diagnosis not present

## 2020-07-03 DIAGNOSIS — Z23 Encounter for immunization: Secondary | ICD-10-CM | POA: Insufficient documentation

## 2020-07-03 DIAGNOSIS — I517 Cardiomegaly: Secondary | ICD-10-CM | POA: Diagnosis not present

## 2020-07-03 HISTORY — PX: LEFT HEART CATH AND CORONARY ANGIOGRAPHY: CATH118249

## 2020-07-03 LAB — CBC WITH DIFFERENTIAL/PLATELET
Abs Immature Granulocytes: 0.1 10*3/uL — ABNORMAL HIGH (ref 0.00–0.07)
Basophils Absolute: 0.1 10*3/uL (ref 0.0–0.1)
Basophils Relative: 0 %
Eosinophils Absolute: 0.2 10*3/uL (ref 0.0–0.5)
Eosinophils Relative: 1 %
HCT: 47.6 % (ref 39.0–52.0)
Hemoglobin: 16.4 g/dL (ref 13.0–17.0)
Immature Granulocytes: 1 %
Lymphocytes Relative: 13 %
Lymphs Abs: 2.4 10*3/uL (ref 0.7–4.0)
MCH: 30.7 pg (ref 26.0–34.0)
MCHC: 34.5 g/dL (ref 30.0–36.0)
MCV: 89 fL (ref 80.0–100.0)
Monocytes Absolute: 1.1 10*3/uL — ABNORMAL HIGH (ref 0.1–1.0)
Monocytes Relative: 6 %
Neutro Abs: 14.1 10*3/uL — ABNORMAL HIGH (ref 1.7–7.7)
Neutrophils Relative %: 79 %
Platelets: 323 10*3/uL (ref 150–400)
RBC: 5.35 MIL/uL (ref 4.22–5.81)
RDW: 13.1 % (ref 11.5–15.5)
WBC: 17.9 10*3/uL — ABNORMAL HIGH (ref 4.0–10.5)
nRBC: 0 % (ref 0.0–0.2)

## 2020-07-03 LAB — BASIC METABOLIC PANEL
Anion gap: 9 (ref 5–15)
BUN: 14 mg/dL (ref 6–20)
CO2: 24 mmol/L (ref 22–32)
Calcium: 9.1 mg/dL (ref 8.9–10.3)
Chloride: 100 mmol/L (ref 98–111)
Creatinine, Ser: 1.03 mg/dL (ref 0.61–1.24)
GFR, Estimated: >60 mL/min (ref >60)
Glucose, Bld: 135 mg/dL — ABNORMAL HIGH (ref 70–99)
Potassium: 3.4 mmol/L — ABNORMAL LOW (ref 3.5–5.1)
Sodium: 133 mmol/L — ABNORMAL LOW (ref 135–145)

## 2020-07-03 LAB — HEMOGLOBIN A1C
Hgb A1c MFr Bld: 8.1 % — ABNORMAL HIGH (ref 4.8–5.6)
Mean Plasma Glucose: 185.77 mg/dL

## 2020-07-03 LAB — TROPONIN I (HIGH SENSITIVITY)
Troponin I (High Sensitivity): 4 ng/L (ref <18)
Troponin I (High Sensitivity): 4 ng/L (ref <18)

## 2020-07-03 LAB — GLUCOSE, CAPILLARY
Glucose-Capillary: 78 mg/dL (ref 70–99)
Glucose-Capillary: 58 mg/dL — ABNORMAL LOW (ref 70–99)
Glucose-Capillary: 85 mg/dL (ref 70–99)
Glucose-Capillary: 164 mg/dL — ABNORMAL HIGH (ref 70–99)

## 2020-07-03 LAB — CBG MONITORING, ED
Glucose-Capillary: 78 mg/dL (ref 70–99)
Glucose-Capillary: 78 mg/dL (ref 70–99)

## 2020-07-03 LAB — RESP PANEL BY RT-PCR (FLU A&B, COVID) ARPGX2
Influenza A by PCR: NEGATIVE
Influenza B by PCR: NEGATIVE
SARS Coronavirus 2 by RT PCR: NEGATIVE

## 2020-07-03 LAB — HIV ANTIBODY (ROUTINE TESTING W REFLEX): HIV Screen 4th Generation wRfx: NONREACTIVE

## 2020-07-03 LAB — TSH: TSH: 0.924 u[IU]/mL (ref 0.350–4.500)

## 2020-07-03 LAB — D-DIMER, QUANTITATIVE: D-Dimer, Quant: <0.27 ug/mL-FEU (ref 0.00–0.50)

## 2020-07-03 LAB — HEPARIN LEVEL (UNFRACTIONATED)
Heparin Unfractionated: 0.15 IU/mL — ABNORMAL LOW (ref 0.30–0.70)
Heparin Unfractionated: <0.1 IU/mL — ABNORMAL LOW (ref 0.30–0.70)

## 2020-07-03 LAB — MAGNESIUM: Magnesium: 2.2 mg/dL (ref 1.7–2.4)

## 2020-07-03 SURGERY — LEFT HEART CATH AND CORONARY ANGIOGRAPHY
Anesthesia: LOCAL

## 2020-07-03 MED ORDER — SODIUM CHLORIDE 0.9% FLUSH
3.0000 mL | Freq: Two times a day (BID) | INTRAVENOUS | Status: DC
  Administered 2020-07-04: 3 mL via INTRAVENOUS

## 2020-07-03 MED ORDER — SODIUM CHLORIDE 0.9 % IV SOLN: 250.0000 mL | INTRAVENOUS | Status: DC | PRN

## 2020-07-03 MED ORDER — ACETAMINOPHEN 325 MG PO TABS: 650.0000 mg | ORAL_TABLET | Freq: Four times a day (QID) | ORAL | Status: DC | PRN

## 2020-07-03 MED ORDER — SODIUM CHLORIDE 0.9% FLUSH: 3.0000 mL | INTRAVENOUS | Status: DC | PRN

## 2020-07-03 MED ORDER — MORPHINE SULFATE (PF) 4 MG/ML IV SOLN
4.0000 mg | Freq: Once | INTRAVENOUS | Status: AC
Start: 2020-07-03 — End: 2020-07-03
  Administered 2020-07-03: 4 mg via INTRAVENOUS
  Filled 2020-07-03: qty 1

## 2020-07-03 MED ORDER — SODIUM CHLORIDE 0.9 % WEIGHT BASED INFUSION
1.0000 mL/kg/h | INTRAVENOUS | Status: AC
  Administered 2020-07-03: 1 mL/kg/h via INTRAVENOUS

## 2020-07-03 MED ORDER — NITROGLYCERIN 2 % TD OINT
1.0000 [in_us] | TOPICAL_OINTMENT | Freq: Once | TRANSDERMAL | Status: AC
  Administered 2020-07-03: 1 [in_us] via TOPICAL
  Filled 2020-07-03: qty 1

## 2020-07-03 MED ORDER — HEPARIN SODIUM (PORCINE) 1000 UNIT/ML IJ SOLN
INTRAMUSCULAR | Status: AC
  Filled 2020-07-03: qty 1

## 2020-07-03 MED ORDER — POTASSIUM CHLORIDE CRYS ER 20 MEQ PO TBCR
40.0000 meq | EXTENDED_RELEASE_TABLET | Freq: Once | ORAL | Status: AC
  Administered 2020-07-03: 40 meq via ORAL
  Filled 2020-07-03: qty 2

## 2020-07-03 MED ORDER — ATORVASTATIN CALCIUM 10 MG PO TABS
20.0000 mg | ORAL_TABLET | Freq: Every day | ORAL | Status: DC
  Filled 2020-07-03: qty 2

## 2020-07-03 MED ORDER — LABETALOL HCL 5 MG/ML IV SOLN: 10.0000 mg | INTRAVENOUS | Status: AC | PRN

## 2020-07-03 MED ORDER — SODIUM CHLORIDE 0.9 % WEIGHT BASED INFUSION
3.0000 mL/kg/h | INTRAVENOUS | Status: DC
  Administered 2020-07-03: 3 mL/kg/h via INTRAVENOUS

## 2020-07-03 MED ORDER — HEPARIN (PORCINE) IN NACL 1000-0.9 UT/500ML-% IV SOLN
INTRAVENOUS | Status: DC | PRN
  Administered 2020-07-03 (×2): 500 mL

## 2020-07-03 MED ORDER — HYDRALAZINE HCL 20 MG/ML IJ SOLN: 10.0000 mg | INTRAMUSCULAR | Status: AC | PRN

## 2020-07-03 MED ORDER — LIDOCAINE HCL (PF) 1 % IJ SOLN
INTRAMUSCULAR | Status: DC | PRN
  Administered 2020-07-03: 2 mL

## 2020-07-03 MED ORDER — VERAPAMIL HCL 2.5 MG/ML IV SOLN
INTRAVENOUS | Status: DC | PRN
  Administered 2020-07-03: 10 mL via INTRA_ARTERIAL

## 2020-07-03 MED ORDER — LIDOCAINE HCL (PF) 1 % IJ SOLN
INTRAMUSCULAR | Status: AC
  Filled 2020-07-03: qty 30

## 2020-07-03 MED ORDER — ACETAMINOPHEN 650 MG RE SUPP: 650.0000 mg | Freq: Four times a day (QID) | RECTAL | Status: DC | PRN

## 2020-07-03 MED ORDER — FENTANYL CITRATE (PF) 100 MCG/2ML IJ SOLN
INTRAMUSCULAR | Status: DC | PRN
  Administered 2020-07-03 (×2): 25 ug via INTRAVENOUS

## 2020-07-03 MED ORDER — MIDAZOLAM HCL 2 MG/2ML IJ SOLN
INTRAMUSCULAR | Status: AC
  Filled 2020-07-03: qty 2

## 2020-07-03 MED ORDER — VERAPAMIL HCL 2.5 MG/ML IV SOLN
INTRAVENOUS | Status: AC
  Filled 2020-07-03: qty 2

## 2020-07-03 MED ORDER — ONDANSETRON HCL 4 MG/2ML IJ SOLN: 4.0000 mg | Freq: Four times a day (QID) | INTRAMUSCULAR | Status: DC | PRN

## 2020-07-03 MED ORDER — ATORVASTATIN CALCIUM 40 MG PO TABS
40.0000 mg | ORAL_TABLET | Freq: Every day | ORAL | Status: DC
  Administered 2020-07-03 – 2020-07-04 (×2): 40 mg via ORAL
  Filled 2020-07-03 (×2): qty 1

## 2020-07-03 MED ORDER — FENTANYL CITRATE (PF) 100 MCG/2ML IJ SOLN
INTRAMUSCULAR | Status: AC
  Filled 2020-07-03: qty 2

## 2020-07-03 MED ORDER — HEPARIN (PORCINE) IN NACL 1000-0.9 UT/500ML-% IV SOLN
INTRAVENOUS | Status: AC
  Filled 2020-07-03: qty 1000

## 2020-07-03 MED ORDER — POLYETHYLENE GLYCOL 3350 17 G PO PACK: 17.0000 g | PACK | Freq: Every day | ORAL | Status: DC | PRN

## 2020-07-03 MED ORDER — HEPARIN BOLUS VIA INFUSION
4000.0000 [IU] | Freq: Once | INTRAVENOUS | Status: AC
  Administered 2020-07-03: 4000 [IU] via INTRAVENOUS

## 2020-07-03 MED ORDER — SODIUM CHLORIDE 0.9 % WEIGHT BASED INFUSION: 1.0000 mL/kg/h | INTRAVENOUS | Status: DC

## 2020-07-03 MED ORDER — MIDAZOLAM HCL 2 MG/2ML IJ SOLN
INTRAMUSCULAR | Status: DC | PRN
  Administered 2020-07-03 (×2): 1 mg via INTRAVENOUS

## 2020-07-03 MED ORDER — ASPIRIN EC 81 MG PO TBEC
81.0000 mg | DELAYED_RELEASE_TABLET | Freq: Every day | ORAL | Status: DC
  Administered 2020-07-03 – 2020-07-04 (×2): 81 mg via ORAL
  Filled 2020-07-03 (×2): qty 1

## 2020-07-03 MED ORDER — HEPARIN (PORCINE) 25000 UT/250ML-% IV SOLN
1350.0000 [IU]/h | INTRAVENOUS | Status: DC
  Administered 2020-07-03: 1100 [IU]/h via INTRAVENOUS
  Filled 2020-07-03: qty 250

## 2020-07-03 MED ORDER — INSULIN ASPART 100 UNIT/ML ~~LOC~~ SOLN: 0.0000 [IU] | SUBCUTANEOUS | Status: DC

## 2020-07-03 MED ORDER — GABAPENTIN 300 MG PO CAPS
600.0000 mg | ORAL_CAPSULE | Freq: Two times a day (BID) | ORAL | Status: DC
  Administered 2020-07-03 – 2020-07-04 (×2): 600 mg via ORAL
  Filled 2020-07-03 (×3): qty 2

## 2020-07-03 MED ORDER — BUPROPION HCL ER (XL) 150 MG PO TB24
300.0000 mg | ORAL_TABLET | Freq: Every day | ORAL | Status: DC
  Administered 2020-07-03 – 2020-07-04 (×2): 300 mg via ORAL
  Filled 2020-07-03 (×3): qty 2

## 2020-07-03 MED ORDER — IOHEXOL 350 MG/ML SOLN
INTRAVENOUS | Status: DC | PRN
  Administered 2020-07-03: 70 mL

## 2020-07-03 MED ORDER — ONDANSETRON HCL 4 MG PO TABS: 4.0000 mg | ORAL_TABLET | Freq: Four times a day (QID) | ORAL | Status: DC | PRN

## 2020-07-03 MED ORDER — ASPIRIN 81 MG PO CHEW: 81.0000 mg | CHEWABLE_TABLET | ORAL | Status: DC

## 2020-07-03 MED ORDER — INFLUENZA VAC SPLIT QUAD 0.5 ML IM SUSY
0.5000 mL | PREFILLED_SYRINGE | INTRAMUSCULAR | Status: AC
  Administered 2020-07-04: 0.5 mL via INTRAMUSCULAR
  Filled 2020-07-03: qty 0.5

## 2020-07-03 MED ORDER — HEPARIN SODIUM (PORCINE) 1000 UNIT/ML IJ SOLN
INTRAMUSCULAR | Status: DC | PRN
  Administered 2020-07-03: 5000 [IU] via INTRAVENOUS

## 2020-07-03 MED ORDER — HEPARIN BOLUS VIA INFUSION
2000.0000 [IU] | Freq: Once | INTRAVENOUS | Status: AC
  Administered 2020-07-03: 2000 [IU] via INTRAVENOUS
  Filled 2020-07-03: qty 2000

## 2020-07-03 MED ORDER — LISINOPRIL 5 MG PO TABS
5.0000 mg | ORAL_TABLET | Freq: Every day | ORAL | Status: DC
  Administered 2020-07-03 – 2020-07-04 (×2): 5 mg via ORAL
  Filled 2020-07-03 (×3): qty 1

## 2020-07-03 SURGICAL SUPPLY — 11 items
CATH 5FR JL3.5 JR4 ANG PIG MP (CATHETERS) ×1 IMPLANT
DEVICE RAD COMP TR BAND LRG (VASCULAR PRODUCTS) ×1 IMPLANT
GLIDESHEATH SLEND SS 6F .021 (SHEATH) ×1 IMPLANT
GUIDEWIRE ANGLED .035X150CM (WIRE) ×1 IMPLANT
GUIDEWIRE INQWIRE 1.5J.035X260 (WIRE) IMPLANT
INQWIRE 1.5J .035X260CM (WIRE) ×2
KIT HEART LEFT (KITS) ×2 IMPLANT
PACK CARDIAC CATHETERIZATION (CUSTOM PROCEDURE TRAY) ×2 IMPLANT
SYR MEDRAD MARK 7 150ML (SYRINGE) ×2 IMPLANT
TRANSDUCER W/STOPCOCK (MISCELLANEOUS) ×2 IMPLANT
TUBING CIL FLEX 10 FLL-RA (TUBING) ×2 IMPLANT

## 2020-07-03 NOTE — Interval H&P Note (Signed)
Cath Lab Visit (complete for each Cath Lab visit)  Clinical Evaluation Leading to the Procedure:   ACS: Yes.    Non-ACS:    Anginal Classification: CCS IV  Anti-ischemic medical therapy: No Therapy  Non-Invasive Test Results: No non-invasive testing performed  Prior CABG: No previous CABG      History and Physical Interval Note:  07/03/2020 4:19 PM  Levi Meyers  has presented today for surgery, with the diagnosis of chest pain.  The various methods of treatment have been discussed with the patient and family. After consideration of risks, benefits and other options for treatment, the patient has consented to  Procedure(s): LEFT HEART CATH AND CORONARY ANGIOGRAPHY (N/A) as a surgical intervention.  The patient's history has been reviewed, patient examined, no change in status, stable for surgery.  I have reviewed the patient's chart and labs.  Questions were answered to the patient's satisfaction.     Sherren Mocha

## 2020-07-03 NOTE — ED Provider Notes (Signed)
[name] Provider Note   CSN: [id number] Arrival date & time: 07/03/20  0221     History Chief Complaint  Patient presents with   Chest Pain    Levi Meyers is a 54 y.o. male.  HPI  HPI: A 54 year old patient with a history of treated diabetes, hypertension, hypercholesterolemia and obesity presents for evaluation of chest pain. Initial onset of pain was less than one hour ago. The patient's chest pain is described as heaviness/pressure/tightness, is worse with exertion and is relieved by nitroglycerin. The patient reports some diaphoresis. The patient's chest pain is middle- or left-sided, is not well-localized, is not sharp and does not radiate to the arms/jaw/neck. The patient does not complain of nausea. The patient has no history of stroke, has no history of peripheral artery disease, has not smoked in the past 90 days and has no relevant family history of coronary artery disease (first degree relative at less than age 53).    This a 54 year old male who presents with chest pain.  Patient had onset of pressure-like anterior nonradiating chest pain after having intercourse early this morning around 130.  He took an aspirin.  Pain did not improve.  He reports associated shortness of breath and diaphoresis.  Currently his pain is 8 out of 10.  No recent leg swelling or history of blood clots.  He has a history of hypertension, hyperlipidemia, diabetes, and former smoking.  Denies fever, cough.  Chart reviewed.  Patient saw Dr. Burt Knack in 2018 for chest pain concerning for ACS given his history.  It was recommended to get a gated CT scan.  I do not see in the chart where this happened.  He did have a cardiac catheterization in 2010 with 30% noted disease in a single vessel.  He has longstanding diabetes and a family history with both mother and father with coronary artery disease.  Past Medical History:  Diagnosis Date   Asthma    Depression     Diabetes mellitus    Diabetes mellitus without complication (HCC)    GERD (gastroesophageal reflux disease)    High cholesterol    History of kidney stones    Hyperlipidemia    Hypertension    Kidney disease    stones   Kidney stone    Noncompliance with diabetes treatment    Sleep apnea    cpap    Patient Active Problem List   Diagnosis Date Noted   Cervical spondylosis with radiculopathy 06/24/2014   OSA on CPAP 02/18/2013   DOE (dyspnea on exertion) 01/07/2013   Tachycardia 01/07/2013   Edema extremities 01/07/2013   Bipolar affective disorder ([place]) 03/31/2012   Type 1 diabetes mellitus with hypoglycemia ([place]) 11/22/2011   Amblyopia 11/22/2011   Bronchitis, acute 11/03/2011   DIABETES MELLITUS, TYPE I 04/28/2008   HYPERLIPIDEMIA 04/28/2008   RHEUMATIC FEVER 04/28/2008   HYPERTENSION 04/28/2008   RENAL CALCULUS, HX OF 04/28/2008   CHICKENPOX, HX OF 04/28/2008    Past Surgical History:  Procedure Laterality Date   ANTERIOR CERVICAL DECOMP/DISCECTOMY FUSION N/A 06/24/2014   Procedure: Cervical five-six anterior cervical decompression with fusion interbody prosthesis with plating and bonegraft;  Surgeon: Consuella Lose, MD;  Location: [facility];  Service: Neurosurgery;  Laterality: N/A;  Cervical five-six anterior cervical decompression with fusion interbody prosthesis with plating and bonegraft   APPENDECTOMY  2004   APPENDECTOMY     CARDIAC CATHETERIZATION     Dr. Verl Blalock ([place])   CERVICAL SPINE SURGERY  CHOLECYSTECTOMY  2004   CHOLECYSTECTOMY     COLONOSCOPY N/A 09/08/2012   Procedure: COLONOSCOPY;  Surgeon: Leighton Ruff, MD;  Location: [facility];  Service: Endoscopy;  Laterality: N/A;   EYE SURGERY  1970   EYE SURGERY     HERNIA REPAIR     KNEE CARTILAGE SURGERY     LEG SURGERY     SKIN GRAFT  1974   SKIN GRAFT FULL THICKNESS LEG     STRABISMUS SURGERY         Family History  Problem Relation Age of  Onset   Diabetes Mother    Heart attack Mother    Stroke Mother    Diabetes Father    Heart disease Other        Parent   Heart disease Other        Grandparents    Social History   Tobacco Use   Smoking status: Former Smoker    Years: 4.00    Types: Cigarettes   Smokeless tobacco: Former Systems developer  Substance Use Topics   Alcohol use: No   Drug use: No    Home Medications Prior to Admission medications   Medication Sig Start Date End Date Taking? Authorizing Provider  aspirin 81 MG tablet Take 81 mg by mouth daily.   Yes [provider]  atorvastatin (LIPITOR) 20 MG tablet Take 20 mg by mouth daily.   Yes [provider]  lisinopril (PRINIVIL,ZESTRIL) 5 MG tablet Take 5 mg by mouth daily.   Yes [provider]  amoxicillin-clavulanate (AUGMENTIN) 875-125 MG tablet Take 1 tablet by mouth 2 (two) times daily. 06/03/20   Kinnie Feil, PA-C  buPROPion (WELLBUTRIN XL) 300 MG 24 hr tablet Take 300 mg by mouth daily.     [provider]  diclofenac Sodium (VOLTAREN) 1 % GEL Apply 4 g topically 4 (four) times daily. 07/21/19   Malvin Johns, MD  dicyclomine (BENTYL) 20 MG tablet Take 1 tablet (20 mg total) by mouth 2 (two) times daily as needed for spasms. 12/17/19   Hayden Rasmussen, MD  Dulaglutide (TRULICITY) 1.5 HQ/4.6NG SOPN Inject 1.5 mg into the skin every Monday.     [provider]  Fluocinonide Emulsified Base 0.05 % CREA Apply 1 application topically daily as needed (for affected area(s)).  07/21/19   [provider]  gabapentin (NEURONTIN) 300 MG capsule Take 600 mg by mouth 2 (two) times daily.    [provider]  guaiFENesin (DIABETIC TUSSIN EX) 100 MG/5ML liquid Take 200 mg by mouth 3 (three) times daily as needed for cough.    [provider]  HYDROcodone-acetaminophen (NORCO/VICODIN) 5-325 MG tablet Take 1-2 tablets by mouth every 6 (six) hours as needed. 07/21/19   Malvin Johns, MD   ibuprofen (ADVIL,MOTRIN) 200 MG tablet Take 400 mg by mouth 2 (two) times daily as needed (pain).    [provider]  insulin aspart (NOVOLOG FLEXPEN) 100 UNIT/ML FlexPen Inject 30 Units into the skin daily before supper.     [provider]  Insulin Glargine (LANTUS SOLOSTAR) 100 UNIT/ML Solostar Pen Inject 60 Units into the skin 2 (two) times daily.     [provider]  ondansetron (ZOFRAN) 4 MG tablet Take 1 tablet (4 mg total) by mouth every 6 (six) hours. 07/28/19   Triplett, Tammy, PA-C  promethazine-dextromethorphan (PROMETHAZINE-DM) 6.25-15 MG/5ML syrup Take 5 mLs by mouth 4 (four) times daily as needed for cough. 06/03/20   Kinnie Feil, PA-C  traZODone (DESYREL) 100 MG tablet Take 100 mg by mouth at bedtime.    [provider]    Allergies    Byetta 10 mcg pen [exenatide], Codeine, Codeine, Invokana [canagliflozin], and Victoza [liraglutide]  Review of Systems   Review of Systems  Constitutional: Positive for diaphoresis. Negative for fever.  Respiratory: Positive for shortness of breath. Negative for cough.   Cardiovascular: Positive for chest pain. Negative for leg swelling.  Gastrointestinal: Negative for abdominal pain, nausea and vomiting.  All other systems reviewed and are negative.   Physical Exam Updated Vital Signs BP 105/81    Pulse 97    Temp 97.7 F (36.5 C) (Oral)    Resp 12    Ht 1.651 m (5\' 5" )    Wt 91.6 kg    SpO2 97%    BMI 33.61 kg/m   Physical Exam Vitals and nursing note reviewed.  Constitutional:      Appearance: He is well-developed and well-nourished. He is not ill-appearing.  HENT:     Head: Normocephalic and atraumatic.  Eyes:     Pupils: Pupils are equal, round, and reactive to light.  Cardiovascular:     Rate and Rhythm: Normal rate and regular rhythm.     Heart sounds: Normal heart sounds. No murmur heard.   Pulmonary:     Effort: Pulmonary effort is normal. No respiratory distress.     Breath  sounds: Normal breath sounds. No wheezing.  Abdominal:     General: Bowel sounds are normal.     Palpations: Abdomen is soft.     Tenderness: There is no abdominal tenderness. There is no rebound.  Musculoskeletal:        General: No edema.     Cervical back: Neck supple.     Right lower leg: No tenderness. No edema.     Left lower leg: No tenderness. No edema.  Lymphadenopathy:     Cervical: No cervical adenopathy.  Skin:    General: Skin is warm and dry.  Neurological:     Mental Status: He is alert and oriented to person, place, and time.  Psychiatric:        Mood and Affect: Mood and affect and mood normal.     ED Results / Procedures / Treatments   Labs (all labs ordered are listed, but only abnormal results are displayed) Labs Reviewed  CBC WITH DIFFERENTIAL/PLATELET - Abnormal; Notable for the following components:      Result Value   WBC 17.9 (*)    Neutro Abs 14.1 (*)    Monocytes Absolute 1.1 (*)    Abs Immature Granulocytes 0.10 (*)    All other components within normal limits  BASIC METABOLIC PANEL - Abnormal; Notable for the following components:   Sodium 133 (*)    Potassium 3.4 (*)    Glucose, Bld 135 (*)    All other components within normal limits  D-DIMER, QUANTITATIVE (NOT AT [facility])  TROPONIN I (HIGH SENSITIVITY)  TROPONIN I (HIGH SENSITIVITY)    EKG EKG Interpretation  Date/Time:  Monday July 03 2020 02:28:57 EST Ventricular Rate:  104 PR Interval:    QRS Duration: 88 QT Interval:  316 QTC Calculation: 416 R Axis:   86 Text Interpretation: Sinus tachycardia Borderline T wave abnormalities Baseline wander in lead(s) V5 Confirmed by Thayer Jew [phone]) on 07/03/2020 3:32:46 AM   Radiology DG Chest 2 View  Result Date: 07/03/2020 CLINICAL DATA:  Chest pain EXAM: CHEST - 2 VIEW COMPARISON:  02/24/2020  FINDINGS: Cardiomegaly. Low lung volumes with bibasilar atelectasis. No effusions or edema. No acute bony abnormality. IMPRESSION:  Cardiomegaly, bibasilar atelectasis. Electronically Signed   By: Rolm Baptise M.D.   On: 07/03/2020 03:05    Procedures Procedures (including critical care time)  Medications Ordered in ED Medications  nitroGLYCERIN (NITROGLYN) 2 % ointment 1 inch (1 inch Topical Given 07/03/20 0309)  morphine 4 MG/ML injection 4 mg (4 mg Intravenous Given 07/03/20 0320)    ED Course  I have reviewed the triage vital signs and the nursing notes.  Pertinent labs & imaging results that were available during my care of the patient were reviewed by me and considered in my medical decision making (see chart for details).    MDM Rules/Calculators/A&P HEAR Score: 6                        Patient presents with chest pain.  Onset after having sexual intercourse.  He is nontoxic-appearing and vital signs are notable for tachycardia.  He is afebrile.  EKG shows some nonspecific T wave changes and tachycardia.  No arrhythmia or ST elevation.  He has high risk for ACS given his risk factors.  Heart score is 6.  Patient was given nitroglycerin and morphine.  Pain improved to 2 out of 10.  Initial troponin is 4 although onset of symptoms was 30 minutes to an hour prior to arrival.  Will repeat troponin.  Given tachycardia, D-dimer was sent and is negative.  Doubt PE.  He has slight metabolic derangements with mild hyponatremia and hyperglycemia.  No anion gap.  He also has a leukocytosis which may be reactive in nature.  Repeat troponin is flat at 4.  Discussed the results with the patient and his wife.  Given his risk factors, exertional symptoms, and improvement with nitroglycerin, feel that he is too high risk to be discharged home for outpatient testing.  Will admit for further work-up and evaluation.  COVID testing sent.  Final Clinical Impression(s) / ED Diagnoses Final diagnoses:  ACS (acute coronary syndrome) ([place])  Precordial pain    Rx / DC Orders ED Discharge Orders    None       Arohi Salvatierra, Barbette Hair,  MD 07/03/20 [phone]

## 2020-07-03 NOTE — Progress Notes (Signed)
ANTICOAGULATION CONSULT NOTE  Pharmacy Consult:  Heparin Indication: chest pain/ACS  Allergies  Allergen Reactions  . Byetta 10 Mcg Pen [Exenatide] Nausea And Vomiting  . Codeine Nausea And Vomiting  . Codeine Nausea Only  . Invokana [Canagliflozin]   . Victoza [Liraglutide] Nausea And Vomiting    Patient Measurements: Height: 5' 5.5" (166.4 cm) Weight: 84.8 kg (186 lb 15.2 oz) IBW/kg (Calculated) : 62.65 Heparin Dosing Weight: 81.3 kg  Vital Signs: Temp: 97.7 F (36.5 C) (12/13 1248) Temp Source: Oral (12/13 1248) BP: 115/78 (12/13 1248) Pulse Rate: 86 (12/13 1248)  Labs: Recent Labs    07/03/20 0238 07/03/20 0442 07/03/20 1320  HGB 16.4  --   --   HCT 47.6  --   --   PLT 323  --   --   HEPARINUNFRC  --   --  0.15*  CREATININE 1.03  --   --   TROPONINIHS 4 4  --     Estimated Creatinine Clearance: 82.9 mL/min (by C-G formula based on SCr of 1.03 mg/dL).    Assessment: 62 YOM presented to [facility] ED with chest pain and started on IV heparin prior to transfer to [facility].  Initial heparin level is sub-therapeutic; no bleeding reported.  Goal of Therapy:  Heparin level 0.3-0.7 units/ml Monitor platelets by anticoagulation protocol: Yes   Plan:  Heparin 2000 units IV bolus, then Increase heparin gtt to 1350 units/hr  Check 6 hr heparin level F/u Cards consult  Riot Waterworth D. Mina Marble, PharmD, BCPS, [place] 07/03/2020, 2:39 PM

## 2020-07-03 NOTE — H&P (Signed)
History and Physical    Levi Meyers DOB: 1966/05/17 DOA: 07/03/2020  PCP: Sharilyn Sites, MD   Patient coming from: Home  I have personally briefly reviewed patient's old medical records in [place]  Chief Complaint: Chest pain  HPI: Levi Meyers is a 54 y.o. male with medical history significant of type 2 diabetes, hypertension, hyperlipidemia and obesity initially presented to med Center at [facility] with complaint of chest pain started around 2 AM while having sexual activity.  Felt like pressure/heaviness, worse with exertion.  Patient took aspirin before coming to the med center.  Chest pain relieved with nitroglycerin. Chest pain was mostly on left side of his chest, feels like elephant sitting, some associated shortness of breath and sweating, some nausea but no vomiting. Patient continued to have intermittent pains, currently at 2/10. Per patient he is getting exertional chest pain relieved with rest for some time, some of them associated with shortness of breath, palpitations and some nausea.  Denies any exertional dyspnea, orthopnea or PND.  Patient denies any upper respiratory symptoms, recent illnesses, fever or chills, recent travel, no urinary symptoms except polyuria which is chronic for him, no dysuria or hematuria.  No recent change in his appetite or bowel habits.  Per chart review patient saw Dr. Burt Knack in 2018 for concern of angina and recommended coronary CT scan which was not done.  He also had a cardiac catheterization in 2010 with 30% stenosis of single-vessel.  Patient has family history of coronary artery disease in both parents and uncles.  ED Course: Hemodynamically stable, labs pertinent for negative troponin x2, mild hypokalemia, and leukocytosis.  Chest x-ray with cardiomegaly and bibasilar atelectasis.  EKG without any acute change, patient was started on heparin infusion and was sent to [facility] for ACS rule out.  Review of  Systems: As per HPI otherwise 10 point review of systems negative.   Past Medical History:  Diagnosis Date  . Asthma   . Depression   . Diabetes mellitus   . Diabetes mellitus without complication ([place])   . GERD (gastroesophageal reflux disease)   . High cholesterol   . History of kidney stones   . Hyperlipidemia   . Hypertension   . Kidney disease    stones  . Kidney stone   . Noncompliance with diabetes treatment   . Sleep apnea    cpap    Past Surgical History:  Procedure Laterality Date  . ANTERIOR CERVICAL DECOMP/DISCECTOMY FUSION N/A 06/24/2014   Procedure: Cervical five-six anterior cervical decompression with fusion interbody prosthesis with plating and bonegraft;  Surgeon: Consuella Lose, MD;  Location: [facility];  Service: Neurosurgery;  Laterality: N/A;  Cervical five-six anterior cervical decompression with fusion interbody prosthesis with plating and bonegraft  . APPENDECTOMY  2004  . APPENDECTOMY    . CARDIAC CATHETERIZATION     Dr. Verl Blalock ([place])  . CERVICAL SPINE SURGERY    . CHOLECYSTECTOMY  2004  . CHOLECYSTECTOMY    . COLONOSCOPY N/A 09/08/2012   Procedure: COLONOSCOPY;  Surgeon: Leighton Ruff, MD;  Location: [facility] ENDOSCOPY;  Service: Endoscopy;  Laterality: N/A;  . EYE SURGERY  1970  . EYE SURGERY    . HERNIA REPAIR    . KNEE CARTILAGE SURGERY    . LEG SURGERY    . SKIN GRAFT  1974  . SKIN GRAFT FULL THICKNESS LEG    . STRABISMUS SURGERY       reports that he has quit smoking.  His smoking use included cigarettes. He quit after 4.00 years of use. He has quit using smokeless tobacco. He reports that he does not drink alcohol and does not use drugs.  Allergies  Allergen Reactions  . Byetta 10 Mcg Pen [Exenatide] Nausea And Vomiting  . Codeine Nausea And Vomiting  . Codeine Nausea Only  . Invokana [Canagliflozin]   . Victoza [Liraglutide] Nausea And Vomiting    Family History  Problem Relation Age of Onset  . Diabetes Mother   . Heart attack  Mother   . Stroke Mother   . Diabetes Father   . Heart disease Other        Parent  . Heart disease Other        Grandparents    Prior to Admission medications   Medication Sig Start Date End Date Taking? Authorizing Provider  aspirin 81 MG tablet Take 81 mg by mouth daily.   Yes [provider]  atorvastatin (LIPITOR) 20 MG tablet Take 20 mg by mouth daily.   Yes [provider]  lisinopril (PRINIVIL,ZESTRIL) 5 MG tablet Take 5 mg by mouth daily.   Yes [provider]  amoxicillin-clavulanate (AUGMENTIN) 875-125 MG tablet Take 1 tablet by mouth 2 (two) times daily. 06/03/20   Kinnie Feil, PA-C  buPROPion (WELLBUTRIN XL) 300 MG 24 hr tablet Take 300 mg by mouth daily.     [provider]  diclofenac Sodium (VOLTAREN) 1 % GEL Apply 4 g topically 4 (four) times daily. 07/21/19   Malvin Johns, MD  dicyclomine (BENTYL) 20 MG tablet Take 1 tablet (20 mg total) by mouth 2 (two) times daily as needed for spasms. 12/17/19   Hayden Rasmussen, MD  Dulaglutide (TRULICITY) 1.5 XM/4.6OE SOPN Inject 1.5 mg into the skin every Monday.     [provider]  Fluocinonide Emulsified Base 0.05 % CREA Apply 1 application topically daily as needed (for affected area(s)).  07/21/19   [provider]  gabapentin (NEURONTIN) 300 MG capsule Take 600 mg by mouth 2 (two) times daily.    [provider]  guaiFENesin (DIABETIC TUSSIN EX) 100 MG/5ML liquid Take 200 mg by mouth 3 (three) times daily as needed for cough.    [provider]  HYDROcodone-acetaminophen (NORCO/VICODIN) 5-325 MG tablet Take 1-2 tablets by mouth every 6 (six) hours as needed. 07/21/19   Malvin Johns, MD  ibuprofen (ADVIL,MOTRIN) 200 MG tablet Take 400 mg by mouth 2 (two) times daily as needed (pain).    [provider]  insulin aspart (NOVOLOG FLEXPEN) 100 UNIT/ML FlexPen Inject 30 Units into the skin daily before supper.     [provider]   Insulin Glargine (LANTUS SOLOSTAR) 100 UNIT/ML Solostar Pen Inject 60 Units into the skin 2 (two) times daily.     [provider]  ondansetron (ZOFRAN) 4 MG tablet Take 1 tablet (4 mg total) by mouth every 6 (six) hours. 07/28/19   Triplett, Tammy, PA-C  promethazine-dextromethorphan (PROMETHAZINE-DM) 6.25-15 MG/5ML syrup Take 5 mLs by mouth 4 (four) times daily as needed for cough. 06/03/20   Kinnie Feil, PA-C  traZODone (DESYREL) 100 MG tablet Take 100 mg by mouth at bedtime.    [provider]    Physical Exam: Vitals:   07/03/20 1111 07/03/20 1115 07/03/20 1130 07/03/20 1248  BP: 102/68 99/70 109/70 115/78  Pulse: 71 75 66 86  Resp: 18 17 (!) 21 18  Temp:  98 F (36.7 C)  97.7 F (36.5 C)  TempSrc:  Oral  Oral  SpO2: 96% 92% 94% 99%  Weight:      Height:        General: Vital signs reviewed.  Patient is well-developed and well-nourished, in no acute distress and cooperative with exam.  Head: Normocephalic and atraumatic. Eyes: EOMI, conjunctivae normal, no scleral icterus.  ENMT: Mucous membranes are moist.  Neck: Supple, trachea midline, normal ROM, no JVD, masses, thyromegaly, or carotid bruit present.  Cardiovascular: RRR, S1 normal, S2 normal, no murmurs, gallops, or rubs. Pulmonary/Chest: Clear to auscultation bilaterally, no wheezes, rales, or rhonchi. Abdominal: Soft, non-tender, non-distended, BS +, no masses, organomegaly, or guarding present.  Extremities: No lower extremity edema bilaterally,  pulses symmetric and intact bilaterally. No cyanosis or clubbing. Neurological: A&O x3, Strength is normal and symmetric bilaterally, cranial nerve II-XII are grossly intact, no focal motor deficit, sensory intact to light touch bilaterally.  Skin: Warm, dry and intact. No rashes or erythema. Psychiatric: Normal mood and affect. speech and behavior is normal. Cognition and memory are normal.   Labs on Admission: I have personally reviewed following labs  and imaging studies  CBC: Recent Labs  Lab 07/03/20 0238  WBC 17.9*  NEUTROABS 14.1*  HGB 16.4  HCT 47.6  MCV 89.0  PLT 017   Basic Metabolic Panel: Recent Labs  Lab 07/03/20 0238  NA 133*  K 3.4*  CL 100  CO2 24  GLUCOSE 135*  BUN 14  CREATININE 1.03  CALCIUM 9.1   GFR: Estimated Creatinine Clearance: 85.2 mL/min (by C-G formula based on SCr of 1.03 mg/dL). Liver Function Tests: No results for input(s): AST, ALT, ALKPHOS, BILITOT, PROT, ALBUMIN in the last 168 hours. No results for input(s): LIPASE, AMYLASE in the last 168 hours. No results for input(s): AMMONIA in the last 168 hours. Coagulation Profile: No results for input(s): INR, PROTIME in the last 168 hours. Cardiac Enzymes: No results for input(s): CKTOTAL, CKMB, CKMBINDEX, TROPONINI in the last 168 hours. BNP (last 3 results) No results for input(s): PROBNP in the last 8760 hours. HbA1C: No results for input(s): HGBA1C in the last 72 hours. CBG: Recent Labs  Lab 07/03/20 1128 07/03/20 1131 07/03/20 1257  GLUCAP 78 78 78   Lipid Profile: No results for input(s): CHOL, HDL, LDLCALC, TRIG, CHOLHDL, LDLDIRECT in the last 72 hours. Thyroid Function Tests: No results for input(s): TSH, T4TOTAL, FREET4, T3FREE, THYROIDAB in the last 72 hours. Anemia Panel: No results for input(s): VITAMINB12, FOLATE, FERRITIN, TIBC, IRON, RETICCTPCT in the last 72 hours. Urine analysis:    Component Value Date/Time   COLORURINE YELLOW 12/17/2019 1406   APPEARANCEUR CLEAR 12/17/2019 1406   LABSPEC >1.030 (H) 12/17/2019 1406   PHURINE 5.5 12/17/2019 1406   GLUCOSEU >=500 (A) 12/17/2019 1406   HGBUR NEGATIVE 12/17/2019 1406   [place] 12/17/2019 1406   [place] 12/17/2019 1406   PROTEINUR NEGATIVE 12/17/2019 1406   UROBILINOGEN 0.2 03/03/2015 1210   NITRITE NEGATIVE 12/17/2019 1406   LEUKOCYTESUR NEGATIVE 12/17/2019 1406    Radiological Exams on Admission: DG Chest 2 View  Result Date:  07/03/2020 CLINICAL DATA:  Chest pain EXAM: CHEST - 2 VIEW COMPARISON:  02/24/2020 FINDINGS: Cardiomegaly. Low lung volumes with bibasilar atelectasis. No effusions or edema. No acute bony abnormality. IMPRESSION: Cardiomegaly, bibasilar atelectasis. Electronically Signed   By: Rolm Baptise M.D.   On: 07/03/2020 03:05    EKG: Independently reviewed.  Mild sinus tachycardia, T wave flattening in inferolateral leads, similar changes present on prior EKGs.  No other acute change.  Assessment/Plan Active Problems:   Chest pain   Chest pain.  Patient has quite typical chest pain, which seems like angina with exertional component.  Patient has risk factor of being uncontrolled diabetes, hypertension and former smoking.  Patient was started on heparin infusion. -Cardiology consult. -Continue heparin infusion for now. -Continue n.p.o.-he can get diet after cardiology consult if they do not want to do any procedure today. -Continue with home dose of aspirin. -Increase the dose of Lipitor to 40 mg daily.  Leukocytosis.  No other obvious infection.  Most likely reactive. -Continue to monitor.  Hypokalemia.  Mild hypokalemia with potassium of 3.4. -Check magnesium. -Replace potassium.  Pseudohyponatremia.  Secondary to hyperglycemia. -Continue to monitor  Type 2 diabetes mellitus.  Patient has recent A1c checked on 06/28/2020 and it was 8.8.  He was on Trulicity, Lantus and NovoLog at home. -Placed him on every 4 hourly sliding scale. -Can resume home regimen after cardiology consult.  Hypertension. -Continue home dose of lisinopril  Hyperlipidemia. -Increase home dose of Lipitor to 40 mg daily  DVT prophylaxis: Heparin  Code Status: Full code Family Communication: Wife was updated at bedside Disposition Plan: Home Consults called: Cardiology Admission status: Observation   Lorella Nimrod MD [facility]  If 7PM-7AM, please contact night-coverage [email or web address]  07/03/2020,  1:03 PM   This record has been created using Systems analyst. Errors have been sought and corrected,but may not always be located. Such creation errors do not reflect on the standard of care.

## 2020-07-03 NOTE — Progress Notes (Signed)
ANTICOAGULATION CONSULT NOTE - Initial Consult  Pharmacy Consult for heparin Indication: chest pain/ACS  Allergies  Allergen Reactions  . Byetta 10 Mcg Pen [Exenatide] Nausea And Vomiting  . Codeine Nausea And Vomiting  . Codeine Nausea Only  . Invokana [Canagliflozin]   . Victoza [Liraglutide] Nausea And Vomiting    Patient Measurements: Height: 5\' 5"  (165.1 cm) Weight: 91.6 kg (202 lb) IBW/kg (Calculated) : 61.5 Heparin Dosing Weight: 81.3 kg  Vital Signs: Temp: 97.7 F (36.5 C) (12/13 0430) Temp Source: Oral (12/13 0430) BP: 105/81 (12/13 0500) Pulse Rate: 97 (12/13 0500)  Labs: Recent Labs    07/03/20 0238 07/03/20 0442  HGB 16.4  --   HCT 47.6  --   PLT 323  --   CREATININE 1.03  --   TROPONINIHS 4 4    Estimated Creatinine Clearance: 85.2 mL/min (by C-G formula based on SCr of 1.03 mg/dL).   Medical History: Past Medical History:  Diagnosis Date  . Asthma   . Depression   . Diabetes mellitus   . Diabetes mellitus without complication ([place])   . GERD (gastroesophageal reflux disease)   . High cholesterol   . History of kidney stones   . Hyperlipidemia   . Hypertension   . Kidney disease    stones  . Kidney stone   . Noncompliance with diabetes treatment   . Sleep apnea    cpap    Medications:  See medication history  Assessment: 54 yo man to start heparin for CP.  He was not on anticoagulation PTA.  Hg 16.4, PTLC 323 Goal of Therapy:  Heparin level 0.3-0.7 units/ml Monitor platelets by anticoagulation protocol: Yes   Plan:  Heparin 4000 unit bolus and drip a 1100 units/hr Check heparin level ~ 6 hours after start Daily HL and CBC while on heparin Monitor for bleeding complications   Jhovanny Guinta Poteet 07/03/2020,5:44 AM

## 2020-07-03 NOTE — Progress Notes (Signed)
Hypoglycemic Event  CBG: 58  Treatment: 2 cups of orange juice and crackers  Symptoms: none  Follow-up CBG: Time: 1753 CBG Result: 85  Possible Reasons for Event: NPO for procedure  Comments/MD notified: None    Levi Meyers

## 2020-07-03 NOTE — ED Notes (Signed)
Patient is the spouse of a member of the heartcare team at [facility].

## 2020-07-03 NOTE — H&P (View-Only) (Signed)
Cardiology Consultation:   Patient ID: AYYAN SITES; [id number]; [birth year]   Admit date: 07/03/2020 Date of Consult: 07/03/2020  Primary Care Provider: Sharilyn Sites, MD Primary Cardiologist: Dr. Burt Knack, MD   Patient Profile:   Levi Meyers is a 54 y.o. male with a hx of long standing DM2 on insulin, HTN, HLD and remote tobacco use who is being seen today for the evaluation of chest pain at the request of .  History of Present Illness:   Levi Meyers is a 54yo M with a hx as stated above who presented to [facility] on 07/03/20 with c/o chest pain which began approximately 1am today in the setting of intercourse. There was associated SOB, diaphoresis, and nausea. Per patient report, over the last several weeks he has been experiencing worsening symptoms, mostly occurring with exertional activities such as working or playing with his grandchild. He reports that the pain was initially an 8/10 and improved with NTG paste. He was previously evaluated by Dr. Burt Knack 03/20/2017 for the evaluation of chest pain. Around that time, he had presented to the ED with chest pain found to have negative HsT. He underwent a chest CTA that was negative for PE with OP stress test recommendations. In follow up plan was for echocardiogram to assess for LV dysfunction along with cCTA with FFR. Echo was performed 03/21/2017 that showed normal LVEF and no valvular disease however it does not appear that the coronary CTA was performed.   He has a family hx of CAD in his mother with CABG x2 (initally dx in her 36s) and father with severe CAD at 14yo who died of cardiac disease. He previously underwent cardiac catherization in 2009 in the setting of chest pain that showed minor non-obstructive disease with a 30% LAD stenosis. He then underwent a Lexiscan stress test 01/27/2013 that was also normal with no signs of ischemia or infarct.   On ED arrival, HsT found to be negative x2 with mild hypokalemia and  leukocytosis. EKG with no acute changes when compared to prior tracing. CXR with cardiomegaly and bibasilar atelectasis. He was then started on Heparin infusion and transferred to [facility] for further cardiology evaluation.   Past Medical History:  Diagnosis Date  . Asthma   . Depression   . Diabetes mellitus   . Diabetes mellitus without complication ([place])   . GERD (gastroesophageal reflux disease)   . High cholesterol   . History of kidney stones   . Hyperlipidemia   . Hypertension   . Kidney disease    stones  . Kidney stone   . Noncompliance with diabetes treatment   . Sleep apnea    cpap    Past Surgical History:  Procedure Laterality Date  . ANTERIOR CERVICAL DECOMP/DISCECTOMY FUSION N/A 06/24/2014   Procedure: Cervical five-six anterior cervical decompression with fusion interbody prosthesis with plating and bonegraft;  Surgeon: Consuella Lose, MD;  Location: [facility];  Service: Neurosurgery;  Laterality: N/A;  Cervical five-six anterior cervical decompression with fusion interbody prosthesis with plating and bonegraft  . APPENDECTOMY  2004  . APPENDECTOMY    . CARDIAC CATHETERIZATION     Dr. Verl Blalock ([place])  . CERVICAL SPINE SURGERY    . CHOLECYSTECTOMY  2004  . CHOLECYSTECTOMY    . COLONOSCOPY N/A 09/08/2012   Procedure: COLONOSCOPY;  Surgeon: Leighton Ruff, MD;  Location: [facility] ENDOSCOPY;  Service: Endoscopy;  Laterality: N/A;  . EYE SURGERY  1970  . EYE SURGERY    .  HERNIA REPAIR    . KNEE CARTILAGE SURGERY    . LEG SURGERY    . SKIN GRAFT  1974  . SKIN GRAFT FULL THICKNESS LEG    . STRABISMUS SURGERY       Prior to Admission medications   Medication Sig Start Date End Date Taking? Authorizing Provider  aspirin 81 MG tablet Take 81 mg by mouth daily.   Yes [provider]  atorvastatin (LIPITOR) 20 MG tablet Take 20 mg by mouth daily.   Yes [provider]  lisinopril (PRINIVIL,ZESTRIL) 5 MG tablet Take 5 mg by mouth daily.   Yes [provider]  amoxicillin-clavulanate (AUGMENTIN) 875-125 MG tablet Take 1 tablet by mouth 2 (two) times daily. 06/03/20   Kinnie Feil, PA-C  buPROPion (WELLBUTRIN XL) 300 MG 24 hr tablet Take 300 mg by mouth daily.     [provider]  diclofenac Sodium (VOLTAREN) 1 % GEL Apply 4 g topically 4 (four) times daily. 07/21/19   Malvin Johns, MD  dicyclomine (BENTYL) 20 MG tablet Take 1 tablet (20 mg total) by mouth 2 (two) times daily as needed for spasms. 12/17/19   Hayden Rasmussen, MD  Dulaglutide (TRULICITY) 1.5 VC/9.4WH SOPN Inject 1.5 mg into the skin every Monday.     [provider]  Fluocinonide Emulsified Base 0.05 % CREA Apply 1 application topically daily as needed (for affected area(s)).  07/21/19   [provider]  gabapentin (NEURONTIN) 300 MG capsule Take 600 mg by mouth 2 (two) times daily.    [provider]  guaiFENesin (DIABETIC TUSSIN EX) 100 MG/5ML liquid Take 200 mg by mouth 3 (three) times daily as needed for cough.    [provider]  HYDROcodone-acetaminophen (NORCO/VICODIN) 5-325 MG tablet Take 1-2 tablets by mouth every 6 (six) hours as needed. 07/21/19   Malvin Johns, MD  ibuprofen (ADVIL,MOTRIN) 200 MG tablet Take 400 mg by mouth 2 (two) times daily as needed (pain).    [provider]  insulin aspart (NOVOLOG FLEXPEN) 100 UNIT/ML FlexPen Inject 30 Units into the skin daily before supper.     [provider]  Insulin Glargine (LANTUS SOLOSTAR) 100 UNIT/ML Solostar Pen Inject 60 Units into the skin 2 (two) times daily.     [provider]  ondansetron (ZOFRAN) 4 MG tablet Take 1 tablet (4 mg total) by mouth every 6 (six) hours. 07/28/19   Triplett, Tammy, PA-C  promethazine-dextromethorphan (PROMETHAZINE-DM) 6.25-15 MG/5ML syrup Take 5 mLs by mouth 4 (four) times daily as needed for cough. 06/03/20   Kinnie Feil, PA-C  traZODone (DESYREL) 100 MG tablet Take 100 mg by mouth at bedtime.     [provider]    Inpatient Medications: Scheduled Meds: . [START ON 07/04/2020] influenza vac split quadrivalent PF  0.5 mL Intramuscular Tomorrow-1000   Continuous Infusions: . heparin 1,100 Units/hr (07/03/20 0607)   PRN Meds:   Allergies:    Allergies  Allergen Reactions  . Byetta 10 Mcg Pen [Exenatide] Nausea And Vomiting  . Codeine Nausea And Vomiting  . Codeine Nausea Only  . Invokana [Canagliflozin]   . Victoza [Liraglutide] Nausea And Vomiting    Social History:   Social History   Socioeconomic History  . Marital status: Married    Spouse name: Not on file  . Number of children: 1  . Years of education: Not on file  . Highest education level: Not on file  Occupational History  . Occupation: Unemployed  Tobacco  Use  . Smoking status: Former Smoker    Years: 4.00    Types: Cigarettes  . Smokeless tobacco: Former Network engineer and Sexual Activity  . Alcohol use: No  . Drug use: No  . Sexual activity: Not on file  Other Topics Concern  . Not on file  Social History Narrative   ** Merged History Encounter **       Regular exercise-yes   Caffeine Use-no         Social Determinants of Health   Financial Resource Strain: Not on file  Food Insecurity: Not on file  Transportation Needs: Not on file  Physical Activity: Not on file  Stress: Not on file  Social Connections: Not on file  Intimate Partner Violence: Not on file    Family History:   Family History  Problem Relation Age of Onset  . Diabetes Mother   . Heart attack Mother   . Stroke Mother   . Diabetes Father   . Heart disease Other        Parent  . Heart disease Other        Grandparents   Family Status:  Family Status  Relation Name Status  . Mother  Deceased at age 84       DM, heart issues  . Father  Alive  . MGM  Deceased at age 24  . MGF  Deceased at age 12s       DM, heart issues  . PGM  Deceased at age 65s       heart disease  . PGF  Deceased at age 3s        heart disease  . Other  (Not Specified)  . Other  (Not Specified)    ROS:  Please see the history of present illness.  All other ROS reviewed and negative.     Physical Exam/Data:   Vitals:   07/03/20 1111 07/03/20 1115 07/03/20 1130 07/03/20 1248  BP: 102/68 99/70 109/70 115/78  Pulse: 71 75 66 86  Resp: 18 17 (!) 21 18  Temp:  98 F (36.7 C)  97.7 F (36.5 C)  TempSrc:  Oral  Oral  SpO2: 96% 92% 94% 99%  Weight:    84.8 kg  Height:    5' 5.5" (1.664 m)   No intake or output data in the 24 hours ending 07/03/20 1348 Filed Weights   07/03/20 0228 07/03/20 1248  Weight: 91.6 kg 84.8 kg   Body mass index is 30.64 kg/m.   General: Well developed, well nourished, NAD Neck: Negative for carotid bruits. No JVD Lungs:Clear to ausculation bilaterally. No wheezes, rales, or rhonchi. Breathing is unlabored. Cardiovascular: RRR with S1 S2. No murmurs Abdomen: Soft, non-tender, non-distended. No obvious abdominal masses. Extremities: No edema. Radial pulses 2+ bilaterally Neuro: Alert and oriented. No focal deficits. No facial asymmetry. MAE spontaneously. Psych: Responds to questions appropriately with normal affect.    EKG:  The EKG was personally reviewed and demonstrates: 07/03/20 ST with HR 104bpm, nonspecific TW abnormalities Telemetry:  Telemetry was personally reviewed and demonstrates: 07/03/20 NSR  Relevant CV Studies:  LHC 2009:  RESULTS: Left main coronary artery: The left main coronary artery is free of disease.  Left anterior descending artery: The left anterior descending artery was irregular mainly in its proximal portion and there was 30% proximal narrowing. This was after the first diagonal branch. The LAD gave rise to two diagonal branches and several septal perforators.  Circumflex artery: The circumflex artery  gave rise to a small marginal branch, atrial branch, and a posterolateral branch. There was 30% ostial stenosis in  the circumflex artery but no other significant obstruction.  Right coronary artery: The right coronary artery was a moderately large vessel gave rise to a conus branch, 2 right ventricular branch, posterior descending branch, and posterolateral branch. There are irregularities in this vessel, but no significant obstruction.  Left ventriculogram: The left ventriculogram performed in the RAO projection showed vigorous wall motion with no areas of hypokinesis. The estimated fraction was 65%.  The aortic pressure was 112/78 with mean of 94 and left neck pressure was 112/23.  CONCLUSION: Mild nonobstructive coronary artery disease with 30% narrowing in the proximal LAD, 30% ostial stenosis in the circumflex artery, irregularities in the right coronary, and normal LV function.  RECOMMENDATIONS: In view of the these finding, I think the patient's recent symptoms are not likely ischemic. We will check a D-dimer and decide about further evaluation of his chest discomfort.  Echo 03/22/2007:  - Left ventricle: The cavity size was normal. Systolic function was  normal. The estimated ejection fraction was in the range of 60%  to 65%. Wall motion was normal; there were no regional wall  motion abnormalities. Left ventricular diastolic function  parameters were normal.  - Pulmonary arteries: Systolic pressure could not be accurately  estimated.   Lexiscan stress test 01/27/2013:  Overall Impression:  Normal stress nuclear study. and Low risk stress nuclear study with no scintigraphic evidence of ischemia or infarfction..  Laboratory Data:  Chemistry Recent Labs  Lab 07/03/20 0238  NA 133*  K 3.4*  CL 100  CO2 24  GLUCOSE 135*  BUN 14  CREATININE 1.03  CALCIUM 9.1  GFRNONAA >60  ANIONGAP 9    Total Protein  Date Value Ref Range Status  02/24/2020 7.5 6.5 - 8.1 g/dL Final  03/20/2017 7.0 6.0 - 8.5 g/dL Final   Albumin  Date Value Ref  Range Status  02/24/2020 4.1 3.5 - 5.0 g/dL Final  03/20/2017 4.4 3.5 - 5.5 g/dL Final   AST  Date Value Ref Range Status  02/24/2020 24 15 - 41 U/L Final   ALT  Date Value Ref Range Status  02/24/2020 41 0 - 44 U/L Final   Alkaline Phosphatase  Date Value Ref Range Status  02/24/2020 95 38 - 126 U/L Final   Total Bilirubin  Date Value Ref Range Status  02/24/2020 0.9 0.3 - 1.2 mg/dL Final   Bilirubin Total  Date Value Ref Range Status  03/20/2017 0.4 0.0 - 1.2 mg/dL Final   Hematology Recent Labs  Lab 07/03/20 0238  WBC 17.9*  RBC 5.35  HGB 16.4  HCT 47.6  MCV 89.0  MCH 30.7  MCHC 34.5  RDW 13.1  PLT 323   Cardiac EnzymesNo results for input(s): TROPONINI in the last 168 hours. No results for input(s): TROPIPOC in the last 168 hours.  BNPNo results for input(s): BNP, PROBNP in the last 168 hours.  DDimer  Recent Labs  Lab 07/03/20 0238  DDIMER <0.27   TSH:  Lab Results  Component Value Date   TSH 3.407 04/10/2011   Lipids: Lab Results  Component Value Date   CHOL 101 03/20/2017   HDL 31 (L) 03/20/2017   LDLCALC 45 03/20/2017   TRIG 126 03/20/2017   CHOLHDL 3.3 03/20/2017   HgbA1c: Lab Results  Component Value Date   HGBA1C 13.4 (H) 08/28/2012    Radiology/Studies:  DG Chest 2 View  Result Date: 07/03/2020 CLINICAL DATA:  Chest pain EXAM: CHEST - 2 VIEW COMPARISON:  02/24/2020 FINDINGS: Cardiomegaly. Low lung volumes with bibasilar atelectasis. No effusions or edema. No acute bony abnormality. IMPRESSION: Cardiomegaly, bibasilar atelectasis. Electronically Signed   By: Rolm Baptise M.D.   On: 07/03/2020 03:05   Assessment and Plan:   1. Chest pain with known nonobstructive CAD per cath 2009: -Pt presented to [facility] with c/o chest pain which began approximately 1am today in the setting of intercourse with associated SOB, diaphoresis, and nausea. Per patient report, over the last several weeks he has been experiencing worsening symptoms, mostly  occurring with exertional activities such as working or playing with his grandchild. He reports that the pain was initially an 8/10 and improved with NTG paste.  -Previously evaluated by Dr. Burt Knack for chest pain in the OP setting in 2018 at which time he underwent an echocardiogram that was normal. Plan was also for coronary CTA however it appears this was never performed. -Previous cath 2009 with nonobstructive 30% LAD disease, 30% ostial stenosis in the circumflex artery, irregularities in the right coronary, and normal LV function. -Last myoview stress test with no ischemia or infarct in 2014.  -He has a significant family hx of CAD in noth his mother and father with CABG x 2 in his mother. He has CV risk factors that include long standing DM2, HTN , HLD and remote tobacco use.  -EKG with ST at 104bpm and HsTx2 (4>>4) unrevealing however given significant risk factors would opt for LHC today  -Continue IV Heparin, ASA, statin   2. DM2: -Hb A1c, 8.0 in 11/2019 -Treated with Trulicity, Lantus -SSI while inpatient  -Could consider the addition of Farxiga given CAD   3. HTN: -Stable, 115/78>>109/70>>99/70 -Continue lisinopril -Creatinine, 1.03  4. HLD: -Last LDL, 47 on 11/29/19 -Continue Lipitor 40mg  PO QD     For questions or updates, please contact [place] Please consult [email or web address] for contact info under Cardiology/STEMI.   SignedKathyrn Drown NP-C [facility] Pager: [phone] 07/03/2020 1:48 PM

## 2020-07-03 NOTE — ED Notes (Signed)
Report given to Amy RN at [facility] using SBAR.

## 2020-07-03 NOTE — ED Notes (Signed)
Called patient's wife per patient request to inform her of room assignment at [facility], but that [facility] has not yet been called for transport.

## 2020-07-03 NOTE — ED Triage Notes (Signed)
Left sided chest pain and shortness of breath x30 minutes ago. Took a baby aspirin prior to arrival.

## 2020-07-03 NOTE — Consult Note (Signed)
Cardiology Consultation:   Patient ID: Levi Meyers; [id number]; 14-Jun-1966   Admit date: 07/03/2020 Date of Consult: 07/03/2020  Primary Care Provider: Sharilyn Sites, MD Primary Cardiologist: Dr. Burt Knack, MD   Patient Profile:   Levi Meyers is a 54 y.o. male with a hx of long standing DM2 on insulin, HTN, HLD and remote tobacco use who is being seen today for the evaluation of chest pain at the request of .  History of Present Illness:   Levi Meyers is a 54yo M with a hx as stated above who presented to [facility] on 07/03/20 with c/o chest pain which began approximately 1am today in the setting of intercourse. There was associated SOB, diaphoresis, and nausea. Per patient report, over the last several weeks he has been experiencing worsening symptoms, mostly occurring with exertional activities such as working or playing with his grandchild. He reports that the pain was initially an 8/10 and improved with NTG paste. He was previously evaluated by Dr. Burt Knack 03/20/2017 for the evaluation of chest pain. Around that time, he had presented to the ED with chest pain found to have negative HsT. He underwent a chest CTA that was negative for PE with OP stress test recommendations. In follow up plan was for echocardiogram to assess for LV dysfunction along with cCTA with FFR. Echo was performed 03/21/2017 that showed normal LVEF and no valvular disease however it does not appear that the coronary CTA was performed.   He has a family hx of CAD in his mother with CABG x2 (initally dx in her 35s) and father with severe CAD at 55yo who died of cardiac disease. He previously underwent cardiac catherization in 2009 in the setting of chest pain that showed minor non-obstructive disease with a 30% LAD stenosis. He then underwent a Lexiscan stress test 01/27/2013 that was also normal with no signs of ischemia or infarct.   On ED arrival, HsT found to be negative x2 with mild hypokalemia and  leukocytosis. EKG with no acute changes when compared to prior tracing. CXR with cardiomegaly and bibasilar atelectasis. He was then started on Heparin infusion and transferred to [facility] for further cardiology evaluation.   Past Medical History:  Diagnosis Date  . Asthma   . Depression   . Diabetes mellitus   . Diabetes mellitus without complication ([place])   . GERD (gastroesophageal reflux disease)   . High cholesterol   . History of kidney stones   . Hyperlipidemia   . Hypertension   . Kidney disease    stones  . Kidney stone   . Noncompliance with diabetes treatment   . Sleep apnea    cpap    Past Surgical History:  Procedure Laterality Date  . ANTERIOR CERVICAL DECOMP/DISCECTOMY FUSION N/A 06/24/2014   Procedure: Cervical five-six anterior cervical decompression with fusion interbody prosthesis with plating and bonegraft;  Surgeon: Consuella Lose, MD;  Location: [facility];  Service: Neurosurgery;  Laterality: N/A;  Cervical five-six anterior cervical decompression with fusion interbody prosthesis with plating and bonegraft  . APPENDECTOMY  2004  . APPENDECTOMY    . CARDIAC CATHETERIZATION     Dr. Verl Blalock ([place])  . CERVICAL SPINE SURGERY    . CHOLECYSTECTOMY  2004  . CHOLECYSTECTOMY    . COLONOSCOPY N/A 09/08/2012   Procedure: COLONOSCOPY;  Surgeon: Leighton Ruff, MD;  Location: [facility];  Service: Endoscopy;  Laterality: N/A;  . EYE SURGERY  1970  . EYE SURGERY    .  HERNIA REPAIR    . KNEE CARTILAGE SURGERY    . LEG SURGERY    . SKIN GRAFT  1974  . SKIN GRAFT FULL THICKNESS LEG    . STRABISMUS SURGERY       Prior to Admission medications   Medication Sig Start Date End Date Taking? Authorizing Provider  aspirin 81 MG tablet Take 81 mg by mouth daily.   Yes [provider]  atorvastatin (LIPITOR) 20 MG tablet Take 20 mg by mouth daily.   Yes [provider]  lisinopril (PRINIVIL,ZESTRIL) 5 MG tablet Take 5 mg by mouth daily.   Yes [provider]  amoxicillin-clavulanate (AUGMENTIN) 875-125 MG tablet Take 1 tablet by mouth 2 (two) times daily. 06/03/20   Kinnie Feil, PA-C  buPROPion (WELLBUTRIN XL) 300 MG 24 hr tablet Take 300 mg by mouth daily.     [provider]  diclofenac Sodium (VOLTAREN) 1 % GEL Apply 4 g topically 4 (four) times daily. 07/21/19   Malvin Johns, MD  dicyclomine (BENTYL) 20 MG tablet Take 1 tablet (20 mg total) by mouth 2 (two) times daily as needed for spasms. 12/17/19   Hayden Rasmussen, MD  Dulaglutide (TRULICITY) 1.5 ON/6.2XB SOPN Inject 1.5 mg into the skin every Monday.     [provider]  Fluocinonide Emulsified Base 0.05 % CREA Apply 1 application topically daily as needed (for affected area(s)).  07/21/19   [provider]  gabapentin (NEURONTIN) 300 MG capsule Take 600 mg by mouth 2 (two) times daily.    [provider]  guaiFENesin (DIABETIC TUSSIN EX) 100 MG/5ML liquid Take 200 mg by mouth 3 (three) times daily as needed for cough.    [provider]  HYDROcodone-acetaminophen (NORCO/VICODIN) 5-325 MG tablet Take 1-2 tablets by mouth every 6 (six) hours as needed. 07/21/19   Malvin Johns, MD  ibuprofen (ADVIL,MOTRIN) 200 MG tablet Take 400 mg by mouth 2 (two) times daily as needed (pain).    [provider]  insulin aspart (NOVOLOG FLEXPEN) 100 UNIT/ML FlexPen Inject 30 Units into the skin daily before supper.     [provider]  Insulin Glargine (LANTUS SOLOSTAR) 100 UNIT/ML Solostar Pen Inject 60 Units into the skin 2 (two) times daily.     [provider]  ondansetron (ZOFRAN) 4 MG tablet Take 1 tablet (4 mg total) by mouth every 6 (six) hours. 07/28/19   Triplett, Tammy, PA-C  promethazine-dextromethorphan (PROMETHAZINE-DM) 6.25-15 MG/5ML syrup Take 5 mLs by mouth 4 (four) times daily as needed for cough. 06/03/20   Kinnie Feil, PA-C  traZODone (DESYREL) 100 MG tablet Take 100 mg by mouth at bedtime.     [provider]    Inpatient Medications: Scheduled Meds: . [START ON 07/04/2020] influenza vac split quadrivalent PF  0.5 mL Intramuscular Tomorrow-1000   Continuous Infusions: . heparin 1,100 Units/hr (07/03/20 0607)   PRN Meds:   Allergies:    Allergies  Allergen Reactions  . Byetta 10 Mcg Pen [Exenatide] Nausea And Vomiting  . Codeine Nausea And Vomiting  . Codeine Nausea Only  . Invokana [Canagliflozin]   . Victoza [Liraglutide] Nausea And Vomiting    Social History:   Social History   Socioeconomic History  . Marital status: Married    Spouse name: Not on file  . Number of children: 1  . Years of education: Not on file  . Highest education level: Not on file  Occupational History  . Occupation: Unemployed  Tobacco  Use  . Smoking status: Former Smoker    Years: 4.00    Types: Cigarettes  . Smokeless tobacco: Former Network engineer and Sexual Activity  . Alcohol use: No  . Drug use: No  . Sexual activity: Not on file  Other Topics Concern  . Not on file  Social History Narrative   ** Merged History Encounter **       Regular exercise-yes   Caffeine Use-no         Social Determinants of Health   Financial Resource Strain: Not on file  Food Insecurity: Not on file  Transportation Needs: Not on file  Physical Activity: Not on file  Stress: Not on file  Social Connections: Not on file  Intimate Partner Violence: Not on file    Family History:   Family History  Problem Relation Age of Onset  . Diabetes Mother   . Heart attack Mother   . Stroke Mother   . Diabetes Father   . Heart disease Other        Parent  . Heart disease Other        Grandparents   Family Status:  Family Status  Relation Name Status  . Mother  Deceased at age 38       DM, heart issues  . Father  Alive  . MGM  Deceased at age 23  . MGF  Deceased at age 34s       DM, heart issues  . PGM  Deceased at age 50s       heart disease  . PGF  Deceased at age 61s        heart disease  . Other  (Not Specified)  . Other  (Not Specified)    ROS:  Please see the history of present illness.  All other ROS reviewed and negative.     Physical Exam/Data:   Vitals:   07/03/20 1111 07/03/20 1115 07/03/20 1130 07/03/20 1248  BP: 102/68 99/70 109/70 115/78  Pulse: 71 75 66 86  Resp: 18 17 (!) 21 18  Temp:  98 F (36.7 C)  97.7 F (36.5 C)  TempSrc:  Oral  Oral  SpO2: 96% 92% 94% 99%  Weight:    84.8 kg  Height:    5' 5.5" (1.664 m)   No intake or output data in the 24 hours ending 07/03/20 1348 Filed Weights   07/03/20 0228 07/03/20 1248  Weight: 91.6 kg 84.8 kg   Body mass index is 30.64 kg/m.   General: Well developed, well nourished, NAD Neck: Negative for carotid bruits. No JVD Lungs:Clear to ausculation bilaterally. No wheezes, rales, or rhonchi. Breathing is unlabored. Cardiovascular: RRR with S1 S2. No murmurs Abdomen: Soft, non-tender, non-distended. No obvious abdominal masses. Extremities: No edema. Radial pulses 2+ bilaterally Neuro: Alert and oriented. No focal deficits. No facial asymmetry. MAE spontaneously. Psych: Responds to questions appropriately with normal affect.    EKG:  The EKG was personally reviewed and demonstrates: 07/03/20 ST with HR 104bpm, nonspecific TW abnormalities Telemetry:  Telemetry was personally reviewed and demonstrates: 07/03/20 NSR  Relevant CV Studies:  LHC 2009:  RESULTS: Left main coronary artery: The left main coronary artery is free of disease.  Left anterior descending artery: The left anterior descending artery was irregular mainly in its proximal portion and there was 30% proximal narrowing. This was after the first diagonal branch. The LAD gave rise to two diagonal branches and several septal perforators.  Circumflex artery: The circumflex artery  gave rise to a small marginal branch, atrial branch, and a posterolateral branch. There was 30% ostial stenosis in  the circumflex artery but no other significant obstruction.  Right coronary artery: The right coronary artery was a moderately large vessel gave rise to a conus branch, 2 right ventricular branch, posterior descending branch, and posterolateral branch. There are irregularities in this vessel, but no significant obstruction.  Left ventriculogram: The left ventriculogram performed in the RAO projection showed vigorous wall motion with no areas of hypokinesis. The estimated fraction was 65%.  The aortic pressure was 112/78 with mean of 94 and left neck pressure was 112/23.  CONCLUSION: Mild nonobstructive coronary artery disease with 30% narrowing in the proximal LAD, 30% ostial stenosis in the circumflex artery, irregularities in the right coronary, and normal LV function.  RECOMMENDATIONS: In view of the these finding, I think the patient's recent symptoms are not likely ischemic. We will check a D-dimer and decide about further evaluation of his chest discomfort.  Echo 03/22/2007:  - Left ventricle: The cavity size was normal. Systolic function was  normal. The estimated ejection fraction was in the range of 60%  to 65%. Wall motion was normal; there were no regional wall  motion abnormalities. Left ventricular diastolic function  parameters were normal.  - Pulmonary arteries: Systolic pressure could not be accurately  estimated.   Lexiscan stress test 01/27/2013:  Overall Impression:  Normal stress nuclear study. and Low risk stress nuclear study with no scintigraphic evidence of ischemia or infarfction..  Laboratory Data:  Chemistry Recent Labs  Lab 07/03/20 0238  NA 133*  K 3.4*  CL 100  CO2 24  GLUCOSE 135*  BUN 14  CREATININE 1.03  CALCIUM 9.1  GFRNONAA >60  ANIONGAP 9    Total Protein  Date Value Ref Range Status  02/24/2020 7.5 6.5 - 8.1 g/dL Final  03/20/2017 7.0 6.0 - 8.5 g/dL Final   Albumin  Date Value Ref  Range Status  02/24/2020 4.1 3.5 - 5.0 g/dL Final  03/20/2017 4.4 3.5 - 5.5 g/dL Final   AST  Date Value Ref Range Status  02/24/2020 24 15 - 41 U/L Final   ALT  Date Value Ref Range Status  02/24/2020 41 0 - 44 U/L Final   Alkaline Phosphatase  Date Value Ref Range Status  02/24/2020 95 38 - 126 U/L Final   Total Bilirubin  Date Value Ref Range Status  02/24/2020 0.9 0.3 - 1.2 mg/dL Final   Bilirubin Total  Date Value Ref Range Status  03/20/2017 0.4 0.0 - 1.2 mg/dL Final   Hematology Recent Labs  Lab 07/03/20 0238  WBC 17.9*  RBC 5.35  HGB 16.4  HCT 47.6  MCV 89.0  MCH 30.7  MCHC 34.5  RDW 13.1  PLT 323   Cardiac EnzymesNo results for input(s): TROPONINI in the last 168 hours. No results for input(s): TROPIPOC in the last 168 hours.  BNPNo results for input(s): BNP, PROBNP in the last 168 hours.  DDimer  Recent Labs  Lab 07/03/20 0238  DDIMER <0.27   TSH:  Lab Results  Component Value Date   TSH 3.407 04/10/2011   Lipids: Lab Results  Component Value Date   CHOL 101 03/20/2017   HDL 31 (L) 03/20/2017   LDLCALC 45 03/20/2017   TRIG 126 03/20/2017   CHOLHDL 3.3 03/20/2017   HgbA1c: Lab Results  Component Value Date   HGBA1C 13.4 (H) 08/28/2012    Radiology/Studies:  DG Chest 2 View  Result Date: 07/03/2020 CLINICAL DATA:  Chest pain EXAM: CHEST - 2 VIEW COMPARISON:  02/24/2020 FINDINGS: Cardiomegaly. Low lung volumes with bibasilar atelectasis. No effusions or edema. No acute bony abnormality. IMPRESSION: Cardiomegaly, bibasilar atelectasis. Electronically Signed   By: Rolm Baptise M.D.   On: 07/03/2020 03:05   Assessment and Plan:   1. Chest pain with known nonobstructive CAD per cath 2009: -Pt [facility] to [facility] with c/o chest pain which began approximately 1am today in the setting of intercourse with associated SOB, diaphoresis, and nausea. Per patient report, over the last several weeks he has been experiencing worsening symptoms, mostly  occurring with exertional activities such as working or playing with his grandchild. He reports that the pain was initially an 8/10 and improved with NTG paste.  -Previously evaluated by Dr. Burt Knack for chest pain in the OP setting in 2018 at which time he underwent an echocardiogram that was normal. Plan was also for coronary CTA however it appears this was never performed. -Previous cath 2009 with nonobstructive 30% LAD disease, 30% ostial stenosis in the circumflex artery, irregularities in the right coronary, and normal LV function. -Last myoview stress test with no ischemia or infarct in 2014.  -He has a significant family hx of CAD in noth his mother and father with CABG x 2 in his mother. He has CV risk factors that include long standing DM2, HTN , HLD and remote tobacco use.  -EKG with ST at 104bpm and HsTx2 (4>>4) unrevealing however given significant risk factors would opt for LHC today  -Continue IV Heparin, ASA, statin   2. DM2: -Hb A1c, 8.0 in 11/2019 -Treated with Trulicity, Lantus -SSI while inpatient  -Could consider the addition of Farxiga given CAD   3. HTN: -Stable, 115/78>>109/70>>99/70 -Continue lisinopril -Creatinine, 1.03  4. HLD: -Last LDL, 47 on 11/29/19 -Continue Lipitor 40mg  PO QD     For questions or updates, please contact [place] Please consult [email or web address] for contact info under Cardiology/STEMI.   SignedKathyrn Drown NP-C [facility] Pager: [phone] 07/03/2020 1:48 PM

## 2020-07-04 ENCOUNTER — Observation Stay (HOSPITAL_BASED_OUTPATIENT_CLINIC_OR_DEPARTMENT_OTHER): Payer: BC Managed Care – PPO

## 2020-07-04 ENCOUNTER — Encounter (HOSPITAL_COMMUNITY): Payer: Self-pay | Admitting: Cardiovascular Disease

## 2020-07-04 DIAGNOSIS — Z794 Long term (current) use of insulin: Secondary | ICD-10-CM

## 2020-07-04 DIAGNOSIS — E119 Type 2 diabetes mellitus without complications: Secondary | ICD-10-CM

## 2020-07-04 DIAGNOSIS — R079 Chest pain, unspecified: Secondary | ICD-10-CM

## 2020-07-04 DIAGNOSIS — I10 Essential (primary) hypertension: Secondary | ICD-10-CM | POA: Diagnosis not present

## 2020-07-04 DIAGNOSIS — I200 Unstable angina: Secondary | ICD-10-CM | POA: Diagnosis not present

## 2020-07-04 DIAGNOSIS — I259 Chronic ischemic heart disease, unspecified: Secondary | ICD-10-CM | POA: Diagnosis not present

## 2020-07-04 DIAGNOSIS — I340 Nonrheumatic mitral (valve) insufficiency: Secondary | ICD-10-CM

## 2020-07-04 LAB — CBC
HCT: 44.7 % (ref 39.0–52.0)
Hemoglobin: 14.5 g/dL (ref 13.0–17.0)
MCH: 29.7 pg (ref 26.0–34.0)
MCHC: 32.4 g/dL (ref 30.0–36.0)
MCV: 91.6 fL (ref 80.0–100.0)
Platelets: 303 10*3/uL (ref 150–400)
RBC: 4.88 MIL/uL (ref 4.22–5.81)
RDW: 13.2 % (ref 11.5–15.5)
WBC: 8.9 10*3/uL (ref 4.0–10.5)
nRBC: 0 % (ref 0.0–0.2)

## 2020-07-04 LAB — BASIC METABOLIC PANEL
Anion gap: 11 (ref 5–15)
BUN: 11 mg/dL (ref 6–20)
CO2: 23 mmol/L (ref 22–32)
Calcium: 9.6 mg/dL (ref 8.9–10.3)
Chloride: 106 mmol/L (ref 98–111)
Creatinine, Ser: 1.02 mg/dL (ref 0.61–1.24)
GFR, Estimated: >60 mL/min (ref >60)
Glucose, Bld: 80 mg/dL (ref 70–99)
Potassium: 3.6 mmol/L (ref 3.5–5.1)
Sodium: 140 mmol/L (ref 135–145)

## 2020-07-04 LAB — ECHOCARDIOGRAM COMPLETE
Area-P 1/2: 4.21 cm2
Height: 65.5 in
S' Lateral: 2.7 cm
Weight: 3057.6 oz

## 2020-07-04 LAB — GLUCOSE, CAPILLARY
Glucose-Capillary: 72 mg/dL (ref 70–99)
Glucose-Capillary: 81 mg/dL (ref 70–99)

## 2020-07-04 NOTE — Plan of Care (Signed)

## 2020-07-04 NOTE — Progress Notes (Signed)
D/C instructions given and reviewed. No questions asked but encouraged to call with any concerns. 

## 2020-07-04 NOTE — Progress Notes (Signed)
Progress Note  Patient Name: Levi Meyers Date of Encounter: 07/04/2020  [facility] Cardiologist: Gerrit Halls MD  Subjective   Feels well this am. No chest pain. Had some discomfort in right arm from cath but this has resolved.   Inpatient Medications    Scheduled Meds: . aspirin EC  81 mg Oral Daily  . atorvastatin  40 mg Oral Daily  . buPROPion  300 mg Oral Daily  . gabapentin  600 mg Oral BID  . influenza vac split quadrivalent PF  0.5 mL Intramuscular Tomorrow-1000  . insulin aspart  0-20 Units Subcutaneous Q4H  . lisinopril  5 mg Oral Daily  . sodium chloride flush  3 mL Intravenous Q12H  . sodium chloride flush  3 mL Intravenous Q12H   Continuous Infusions: . sodium chloride     PRN Meds: sodium chloride, acetaminophen **OR** acetaminophen, ondansetron **OR** ondansetron (ZOFRAN) IV, polyethylene glycol, sodium chloride flush   Vital Signs    Vitals:   07/03/20 1927 07/04/20 0058 07/04/20 0436 07/04/20 0815  BP: 119/75 129/75 111/73 (!) 127/98  Pulse: (!) 104 88 92 92  Resp: 18 18 17  (!) 21  Temp: 98.1 F (36.7 C) 98.3 F (36.8 C) 97.7 F (36.5 C) 98.6 F (37 C)  TempSrc: Oral Oral Oral Oral  SpO2: 92% 93% 96% 97%  Weight:  86.7 kg    Height:        Intake/Output Summary (Last 24 hours) at 07/04/2020 0855 Last data filed at 07/04/2020 0400 Gross per 24 hour  Intake 700 ml  Output 275 ml  Net 425 ml   Last 3 Weights 07/04/2020 07/03/2020 07/03/2020  Weight (lbs) 191 lb 1.6 oz 186 lb 15.2 oz 202 lb  Weight (kg) 86.682 kg 84.8 kg 91.627 kg      Telemetry    NSR - Personally Reviewed  ECG    No acute change - Personally Reviewed  Physical Exam   GEN: No acute distress.   Neck: No JVD Cardiac: RRR, no murmurs, rubs, or gallops.  Respiratory: Clear to auscultation bilaterally. GI: Soft, nontender, non-distended  MS: No edema; No deformity. Right radial site without hematoma. Pulse 2+ Neuro:  Nonfocal  Psych: Normal affect   Labs     High Sensitivity Troponin:   Recent Labs  Lab 07/03/20 0238 07/03/20 0442  TROPONINIHS 4 4      Chemistry Recent Labs  Lab 07/03/20 0238 07/04/20 0431  NA 133* 140  K 3.4* 3.6  CL 100 106  CO2 24 23  GLUCOSE 135* 80  BUN 14 11  CREATININE 1.03 1.02  CALCIUM 9.1 9.6  GFRNONAA >60 >60  ANIONGAP 9 11     Hematology Recent Labs  Lab 07/03/20 0238 07/04/20 0431  WBC 17.9* 8.9  RBC 5.35 4.88  HGB 16.4 14.5  HCT 47.6 44.7  MCV 89.0 91.6  MCH 30.7 29.7  MCHC 34.5 32.4  RDW 13.1 13.2  PLT 323 303    BNPNo results for input(s): BNP, PROBNP in the last 168 hours.   DDimer  Recent Labs  Lab 07/03/20 0238  DDIMER <0.27     Radiology    DG Chest 2 View  Result Date: 07/03/2020 CLINICAL DATA:  Chest pain EXAM: CHEST - 2 VIEW COMPARISON:  02/24/2020 FINDINGS: Cardiomegaly. Low lung volumes with bibasilar atelectasis. No effusions or edema. No acute bony abnormality. IMPRESSION: Cardiomegaly, bibasilar atelectasis. Electronically Signed   By: Rolm Baptise M.D.   On: 07/03/2020 03:05  CARDIAC CATHETERIZATION  Result Date: 07/03/2020 1.  Patent left main with no obstruction 2.  Mild diffuse nonobstructive LAD stenosis involving the ostium and proximal vessel as well as segmental nonobstructive stenosis in the mid vessel.  There is moderate stenosis of the first diagonal branch of the LAD, not well suited for PCI. 3.  Mild plaquing in the distal circumflex without any significant stenoses 4.  Mild irregularity in the RCA (dominant vessel) with no significant obstruction 5.  Normal LV systolic function with no regional wall motion abnormalities and LVEF estimated at 55 to 65% 6.  Dense pericardial calcification most prominent along the lateral aspect of the heart Recommend: 2D echocardiogram.  As long as no significant abnormalities or evidence of pericardial constriction, would anticipate medical therapy for nonobstructive coronary artery disease.   Cardiac Studies    Echo report pending  Patient Profile     54 y.o. male with DM 2 on insulin, HTN, HLD admitted with complaints of chest pain.   Assessment & Plan    1. Chest pain. Ruled out for MI with normal troponin levels and nonacute Ecg. Cardiac cath showed moderate disease in a small diagonal branch not suitable for PCI. Otherwise non obstructive disease. Incidental finding or pericardial calcification. Chest pain resolved. Echo report pending but I personally reviewed study. No pericardial effusion. Normal LV and valve function. No evidence of constriction. Plan to continue ASA and statin therapy. OK for discharge home today. 2. Pericardial calcification. Probably related to pericarditis in the past. No active symptoms and no effusion or evidence of constriction. No specific therapy needed.  3. HTN. Continue lisinopril 4. DM on insulin- per primary team 5. HLD on statin. Should have lipid panel updated.  [facility] will sign off.   Medication Recommendations:  Per [facility] Other recommendations (labs, testing, etc):  none Follow up as an outpatient:  With Dr Burt Knack in 3-4 weeks.  For questions or updates, please contact [place] Please consult [email or web address] for contact info under        Signed, Alira Fretwell [place], MD  07/04/2020, 8:55 AM

## 2020-07-04 NOTE — Discharge Summary (Signed)
Physician Discharge Summary  Levi Meyers UVO:[id number] DOB: 1965-09-27 DOA: 07/03/2020  PCP: Sharilyn Sites, MD  Admit date: 07/03/2020 Discharge date: 07/04/2020  Time spent: 45 minutes  Recommendations for Outpatient Follow-up:  Patient will be discharged to home.  Patient will need to follow up with primary care provider within one week of discharge, repeat BMP and lipid panel.  Follow up with cardiology. Patient should continue medications as prescribed.  Patient should follow a heart healthy/carb modified diet.   Discharge Diagnoses:  Chest pain Pericardial calcification Leukocytosis Hypokalemia Pseudohyponatremia Diabetes mellitus, type II with hyperglycemia Essential hypertension Hyperlipidemia  Discharge Condition: stable  Diet recommendation: heart healthy/carb modified  Filed Weights   07/03/20 0228 07/03/20 1248 07/04/20 0058  Weight: 91.6 kg 84.8 kg 86.7 kg    Historyof present illness:  On 07/03/2020 by Dr. Lorella Nimrod Levi Meyers is a 54 y.o. male with medical history significant of type 2 diabetes, hypertension, hyperlipidemia and obesity initially presented to med Center at [facility] with complaint of chest pain started around 2 AM while having sexual activity.  Felt like pressure/heaviness, worse with exertion.  Patient took aspirin before coming to the med center.  Chest pain relieved with nitroglycerin. Chest pain was mostly on left side of his chest, feels like elephant sitting, some associated shortness of breath and sweating, some nausea but no vomiting. Patient continued to have intermittent pains, currently at 2/10. Per patient he is getting exertional chest pain relieved with rest for some time, some of them associated with shortness of breath, palpitations and some nausea.  Denies any exertional dyspnea, orthopnea or PND.  Patient denies any upper respiratory symptoms, recent illnesses, fever or chills, recent travel, no urinary symptoms except  polyuria which is chronic for him, no dysuria or hematuria.  No recent change in his appetite or bowel habits.  Per chart review patient saw Dr. Burt Knack in 2018 for concern of angina and recommended coronary CT scan which was not done.  He also had a cardiac catheterization in 2010 with 30% stenosis of single-vessel.  Patient has family history of coronary artery disease in both parents and uncles.  Hospital Course:  Chest pain -Ruled out MI, high-sensitivity troponins were unremarkable, EKG showed no acute changes -Cardiology consulted and appreciated -Cardiac catheterization showed moderate disease in a small diagonal branch not suitable for PCI.  Nonobstructive disease.  Pericardial calcification (see incidental finding) -Echocardiogram report not available. Discussed with Dr. Martinique cardiology, no pericardial effusion, normal LV and valve function.  No evidence of constriction. -Continue aspirin, statin -Patient to follow-up with cardiology  Pericardial calcification -As above incidental finding -Probably related to pericarditis in the past -No specific therapy needed as per cardiology  Leukocytosis -Suspect reactive -Resolved  Hypokalemia -Improved with supplementation -Repeat BMP in 1 week  Pseudohyponatremia -Secondary to hyperglycemia -Resolved  Diabetes mellitus, type II with hyperglycemia -Hemoglobin A1c on 06/28/2020 was 8.8 -Continue home medications on discharge  Essential hypertension -Continue lisinopril  Hyperlipidemia -Continue statin  Procedures: Cardiac catheterization Echocardiogram  Consultations: Cardiology  Discharge Exam: Vitals:   07/04/20 0436 07/04/20 0815  BP: 111/73 (!) 127/98  Pulse: 92 92  Resp: 17 18  Temp: 97.7 F (36.5 C) 98.6 F (37 C)  SpO2: 96% 97%     General: Well developed, well nourished, NAD, appears stated age  HEENT: NCAT, mucous membranes moist.  Cardiovascular: S1 S2 auscultated, RRR  Respiratory: Clear to  auscultation bilaterally with equal chest rise  Abdomen: Soft, nontender, nondistended, + bowel  sounds  Extremities: warm dry without cyanosis clubbing or edema  Neuro: AAOx3, nonfocal  Psych: appropriate mood and affect  Discharge Instructions Discharge Instructions    Discharge instructions   Complete by: As directed    Patient will be discharged to home.  Patient will need to follow up with primary care provider within one week of discharge, repeat BMP and lipid panel.  Follow up with cardiology. Patient should continue medications as prescribed.  Patient should follow a heart healthy/carb modified diet.   Increase activity slowly   Complete by: As directed      Allergies as of 07/04/2020      Reactions   Byetta 10 Mcg Pen [exenatide] Nausea And Vomiting   Codeine Nausea And Vomiting   Invokana [canagliflozin]    Victoza [liraglutide] Nausea And Vomiting      Medication List    STOP taking these medications   diclofenac Sodium 1 % Gel Commonly known as: Voltaren   HYDROcodone-acetaminophen 5-325 MG tablet Commonly known as: NORCO/VICODIN   ondansetron 4 MG tablet Commonly known as: ZOFRAN     TAKE these medications   aspirin 81 MG tablet Take 81 mg by mouth daily.   atorvastatin 20 MG tablet Commonly known as: LIPITOR Take 20 mg by mouth daily.   buPROPion 300 MG 24 hr tablet Commonly known as: WELLBUTRIN XL Take 300 mg by mouth daily.   dicyclomine 20 MG tablet Commonly known as: BENTYL Take 1 tablet (20 mg total) by mouth 2 (two) times daily as needed for spasms.   Dulaglutide 1.5 MG/0.5ML Sopn Inject 1.5 mg into the skin every Monday.   gabapentin 300 MG capsule Commonly known as: NEURONTIN Take 600 mg by mouth 2 (two) times daily.   ibuprofen 200 MG tablet Commonly known as: ADVIL Take 400 mg by mouth 2 (two) times daily as needed (pain).   insulin aspart 100 UNIT/ML FlexPen Commonly known as: NOVOLOG Inject 30 Units into the skin daily  before supper.   insulin glargine 100 UNIT/ML Solostar Pen Commonly known as: LANTUS Inject 60 Units into the skin 2 (two) times daily.   lisinopril 5 MG tablet Commonly known as: ZESTRIL Take 5 mg by mouth daily.   traZODone 100 MG tablet Commonly known as: DESYREL Take 100 mg by mouth at bedtime.      Allergies  Allergen Reactions  . Byetta 10 Mcg Pen [Exenatide] Nausea And Vomiting  . Codeine Nausea And Vomiting  . Invokana [Canagliflozin]   . Victoza [Liraglutide] Nausea And Vomiting    Follow-up Information    Sharilyn Sites, MD. Schedule an appointment as soon as possible for a visit in 1 week(s).   Specialty: Family Medicine Why: Hospital follow up Contact information: [place] [address] [phone]        Sherren Mocha, MD. Schedule an appointment as soon as possible for a visit in 1 week(s).   Specialty: Cardiology Why: Hospital follow Contact information: [address] [place] [address] [phone]                The results of significant diagnostics from this hospitalization (including imaging, microbiology, ancillary and laboratory) are listed below for reference.    Significant Diagnostic Studies: DG Chest 2 View  Result Date: 07/03/2020 CLINICAL DATA:  Chest pain EXAM: CHEST - 2 VIEW COMPARISON:  02/24/2020 FINDINGS: Cardiomegaly. Low lung volumes with bibasilar atelectasis. No effusions or edema. No acute bony abnormality. IMPRESSION: Cardiomegaly, bibasilar atelectasis. Electronically Signed  By: Rolm Baptise M.D.   On: 07/03/2020 03:05   CARDIAC CATHETERIZATION  Result Date: 07/03/2020 1.  Patent left main with no obstruction 2.  Mild diffuse nonobstructive LAD stenosis involving the ostium and proximal vessel as well as segmental nonobstructive stenosis in the mid vessel.  There is moderate stenosis of the first diagonal branch of the LAD, not well suited for PCI. 3.  Mild plaquing in the  distal circumflex without any significant stenoses 4.  Mild irregularity in the RCA (dominant vessel) with no significant obstruction 5.  Normal LV systolic function with no regional wall motion abnormalities and LVEF estimated at 55 to 65% 6.  Dense pericardial calcification most prominent along the lateral aspect of the heart Recommend: 2D echocardiogram.  As long as no significant abnormalities or evidence of pericardial constriction, would anticipate medical therapy for nonobstructive coronary artery disease.   Microbiology: Recent Results (from the past 240 hour(s))  Resp Panel by RT-PCR (Flu A&B, Covid) Nasopharyngeal Swab     Status: None   Collection Time: 07/03/20  4:42 AM   Specimen: Nasopharyngeal Swab; Nasopharyngeal(NP) swabs in vial transport medium  Result Value Ref Range Status   SARS Coronavirus 2 by RT PCR NEGATIVE NEGATIVE Final    Comment: (NOTE) SARS-CoV-2 target nucleic acids are NOT DETECTED.  The SARS-CoV-2 RNA is generally detectable in upper respiratory specimens during the acute phase of infection. The lowest concentration of SARS-CoV-2 viral copies this assay can detect is 138 copies/mL. A negative result does not preclude SARS-Cov-2 infection and should not be used as the sole basis for treatment or other patient management decisions. A negative result may occur with  improper specimen collection/handling, submission of specimen other than nasopharyngeal swab, presence of viral mutation(s) within the areas targeted by this assay, and inadequate number of viral copies(<138 copies/mL). A negative result must be combined with clinical observations, patient history, and epidemiological information. The expected result is Negative.  Fact Sheet for Patients:  EntrepreneurPulse.com.au  Fact Sheet for Healthcare Providers:  IncredibleEmployment.be  This test is no t yet approved or cleared by the [place] FDA and  has been  authorized for detection and/or diagnosis of SARS-CoV-2 by FDA under an Emergency Use Authorization (EUA). This EUA will remain  in effect (meaning this test can be used) for the duration of the COVID-19 declaration under Section 564(b)(1) of the Act, 21 U.S.C.section 360bbb-3(b)(1), unless the authorization is terminated  or revoked sooner.       Influenza A by PCR NEGATIVE NEGATIVE Final   Influenza B by PCR NEGATIVE NEGATIVE Final    Comment: (NOTE) The Xpert Xpress SARS-CoV-2/FLU/RSV plus assay is intended as an aid in the diagnosis of influenza from Nasopharyngeal swab specimens and should not be used as a sole basis for treatment. Nasal washings and aspirates are unacceptable for Xpert Xpress SARS-CoV-2/FLU/RSV testing.  Fact Sheet for Patients: EntrepreneurPulse.com.au  Fact Sheet for Healthcare Providers: IncredibleEmployment.be  This test is not yet approved or cleared by the [place] FDA and has been authorized for detection and/or diagnosis of SARS-CoV-2 by FDA under an Emergency Use Authorization (EUA). This EUA will remain in effect (meaning this test can be used) for the duration of the COVID-19 declaration under Section 564(b)(1) of the Act, 21 U.S.C. section 360bbb-3(b)(1), unless the authorization is terminated or revoked.  Performed at [facility], [address] [place], [place] [address]      Labs: Basic Metabolic Panel: Recent Labs  Lab 07/03/20  1660 07/03/20 1434 07/04/20 0431  NA 133*  --  140  K 3.4*  --  3.6  CL 100  --  106  CO2 24  --  23  GLUCOSE 135*  --  80  BUN 14  --  11  CREATININE 1.03  --  1.02  CALCIUM 9.1  --  9.6  MG  --  2.2  --    Liver Function Tests: No results for input(s): AST, ALT, ALKPHOS, BILITOT, PROT, ALBUMIN in the last 168 hours. No results for input(s): LIPASE, AMYLASE in the last 168 hours. No results for input(s): AMMONIA in the last 168  hours. CBC: Recent Labs  Lab 07/03/20 0238 07/04/20 0431  WBC 17.9* 8.9  NEUTROABS 14.1*  --   HGB 16.4 14.5  HCT 47.6 44.7  MCV 89.0 91.6  PLT 323 303   Cardiac Enzymes: No results for input(s): CKTOTAL, CKMB, CKMBINDEX, TROPONINI in the last 168 hours. BNP: BNP (last 3 results) No results for input(s): BNP in the last 8760 hours.  ProBNP (last 3 results) No results for input(s): PROBNP in the last 8760 hours.  CBG: Recent Labs  Lab 07/03/20 1712 07/03/20 1751 07/03/20 2031 07/04/20 0056 07/04/20 0438  GLUCAP 58* 85 164* 81 72       Signed:  Javontay Vandam  [facility] 07/04/2020, 10:05 AM

## 2020-07-04 NOTE — Progress Notes (Signed)
Echocardiogram 2D Echocardiogram has been performed.  Oneal Deputy Sterling Mondo 07/04/2020, 9:24 AM

## 2020-07-04 NOTE — Discharge Instructions (Signed)

## 2020-07-10 DIAGNOSIS — E6609 Other obesity due to excess calories: Secondary | ICD-10-CM | POA: Diagnosis not present

## 2020-07-10 DIAGNOSIS — E1065 Type 1 diabetes mellitus with hyperglycemia: Secondary | ICD-10-CM | POA: Diagnosis not present

## 2020-07-10 DIAGNOSIS — I2510 Atherosclerotic heart disease of native coronary artery without angina pectoris: Secondary | ICD-10-CM | POA: Diagnosis not present

## 2020-07-10 DIAGNOSIS — Z6831 Body mass index (BMI) 31.0-31.9, adult: Secondary | ICD-10-CM | POA: Diagnosis not present

## 2020-07-10 NOTE — Progress Notes (Signed)
CARDIOLOGY OFFICE NOTE  Date:  07/24/2020    Lorin Glass Date of Birth: 1966/04/12 Medical Record #[id number]  PCP:  Sharilyn Sites, MD  Cardiologist:  Burt Knack  Chief Complaint  Patient presents with  . Follow-up  . Hospitalization Follow-up    Seen for Dr. Burt Knack    History of Present Illness: Levi Meyers is a 54 y.o. male who presents today for a post hospital visit. Seen for Dr. Burt Knack. His wife is one of the covering CMA's in our office.   He has a history of type 2 diabetes, hypertension, hyperlipidemia and obesity. Remote cath in 2010 with 30% single vessel CAD. +FH of CAD.   Last seen here in August of 2018 by Dr. Burt Knack.  More recently presented to the Med Ctr at [facility] with chest pain while having sexual activity. Relieved with NTG. Ended up being transferred to [facility] - was cathed - small diagonal disease - not amenable to PCI - managed medically. Incidental pericardia calcification noted. Has had prior history of pericarditis noted in the past.   Comes in today. Here alone. He has continued to have some chest pain and shortness of breath - all typically with activity but has had previously at rest. Seems to have more with sexual activity. Also with some fast heart beating. He can get short of breath. His diabetic medicines have been adjusted for A1C over 8.   Past Medical History:  Diagnosis Date  . Asthma   . Depression   . Diabetes mellitus   . Diabetes mellitus without complication ([place])   . GERD (gastroesophageal reflux disease)   . High cholesterol   . History of kidney stones   . Hyperlipidemia   . Hypertension   . Kidney disease    stones  . Kidney stone   . Noncompliance with diabetes treatment   . Sleep apnea    cpap    Past Surgical History:  Procedure Laterality Date  . ANTERIOR CERVICAL DECOMP/DISCECTOMY FUSION N/A 06/24/2014   Procedure: Cervical five-six anterior cervical decompression with fusion interbody prosthesis with plating and  bonegraft;  Surgeon: Consuella Lose, MD;  Location: [facility];  Service: Neurosurgery;  Laterality: N/A;  Cervical five-six anterior cervical decompression with fusion interbody prosthesis with plating and bonegraft  . APPENDECTOMY  2004  . APPENDECTOMY    . CARDIAC CATHETERIZATION     Dr. Verl Blalock ([place])  . CERVICAL SPINE SURGERY    . CHOLECYSTECTOMY  2004  . CHOLECYSTECTOMY    . COLONOSCOPY N/A 09/08/2012   Procedure: COLONOSCOPY;  Surgeon: Leighton Ruff, MD;  Location: [facility];  Service: Endoscopy;  Laterality: N/A;  . EYE SURGERY  1970  . EYE SURGERY    . HERNIA REPAIR    . KNEE CARTILAGE SURGERY    . LEFT HEART CATH AND CORONARY ANGIOGRAPHY N/A 07/03/2020   Procedure: LEFT HEART CATH AND CORONARY ANGIOGRAPHY;  Surgeon: Sherren Mocha, MD;  Location: [facility];  Service: Cardiovascular;  Laterality: N/A;  . LEG SURGERY    . SKIN GRAFT  1974  . SKIN GRAFT FULL THICKNESS LEG    . STRABISMUS SURGERY       Medications: Current Meds  Medication Sig  . aspirin 81 MG tablet Take 81 mg by mouth daily.  Marland Kitchen atorvastatin (LIPITOR) 20 MG tablet Take 20 mg by mouth daily.  Marland Kitchen buPROPion (WELLBUTRIN XL) 300 MG 24 hr tablet Take 300 mg by mouth daily.   Marland Kitchen dicyclomine (BENTYL) 20  MG tablet Take 1 tablet (20 mg total) by mouth 2 (two) times daily as needed for spasms.  . Dulaglutide 1.5 MG/0.5ML SOPN Inject 1.5 mg into the skin every Monday.   . gabapentin (NEURONTIN) 300 MG capsule Take 600 mg by mouth 2 (two) times daily.  Marland Kitchen ibuprofen (ADVIL,MOTRIN) 200 MG tablet Take 400 mg by mouth 2 (two) times daily as needed (pain).  . insulin aspart (NOVOLOG) 100 UNIT/ML FlexPen Inject 30 Units into the skin daily before supper.   . insulin glargine (LANTUS) 100 UNIT/ML Solostar Pen Inject 60 Units into the skin 2 (two) times daily.   Marland Kitchen lisinopril (PRINIVIL,ZESTRIL) 5 MG tablet Take 5 mg by mouth daily.  . metoprolol tartrate (LOPRESSOR) 25 MG tablet Take 1 tablet (25 mg total) by mouth  2 (two) times daily.  . nitroGLYCERIN (NITROSTAT) 0.4 MG SL tablet Place 1 tablet (0.4 mg total) under the tongue every 5 (five) minutes as needed for chest pain.  . traZODone (DESYREL) 100 MG tablet Take 100 mg by mouth at bedtime.     Allergies: Allergies  Allergen Reactions  . Byetta 10 Mcg Pen [Exenatide] Nausea And Vomiting  . Codeine Nausea And Vomiting  . Invokana [Canagliflozin]   . Victoza [Liraglutide] Nausea And Vomiting    Social History: The patient  reports that he has quit smoking. His smoking use included cigarettes. He quit after 4.00 years of use. He has quit using smokeless tobacco. He reports that he does not drink alcohol and does not use drugs.   Family History: The patient's family history includes Diabetes in his father and mother; Heart attack in his mother; Heart disease in some other family members; Stroke in his mother.   Review of Systems: Please see the history of present illness.   All other systems are reviewed and negative.   Physical Exam: VS:  BP 124/82 (BP Location: Right Arm, Patient Position: Sitting, Cuff Size: Large)   Pulse (!) 102   Ht 5\' 5"  (1.651 m)   Wt 199 lb 3.2 oz (90.4 kg)   BMI 33.15 kg/m  .  BMI Body mass index is 33.15 kg/m.  Wt Readings from Last 3 Encounters:  07/24/20 199 lb 3.2 oz (90.4 kg)  07/04/20 191 lb 1.6 oz (86.7 kg)  02/24/20 192 lb 14.4 oz (87.5 kg)    General: Alert and in no acute distress.   Cardiac: Regular rate and rhythm but elevated. No murmurs, rubs, or gallops. No edema.  Respiratory:  Lungs are clear to auscultation bilaterally with normal work of breathing.  GI: Soft and nontender.  MS: No deformity or atrophy. Gait and ROM intact.  Skin: Warm and dry. Color is normal.  Neuro:  Strength and sensation are intact and no gross focal deficits noted.  Psych: Alert, appropriate and with normal affect. Right wrist looks fine.    LABORATORY DATA:  EKG:  EKG is not ordered today.   Lab Results   Component Value Date   WBC 8.9 07/04/2020   HGB 14.5 07/04/2020   HCT 44.7 07/04/2020   PLT 303 07/04/2020   GLUCOSE 80 07/04/2020   CHOL 101 03/20/2017   TRIG 126 03/20/2017   HDL 31 (L) 03/20/2017   LDLCALC 45 03/20/2017   ALT 41 02/24/2020   AST 24 02/24/2020   NA 140 07/04/2020   K 3.6 07/04/2020   CL 106 07/04/2020   CREATININE 1.02 07/04/2020   BUN 11 07/04/2020   CO2 23 07/04/2020   TSH 0.924  07/03/2020   INR 1.0 09/12/2008   HGBA1C 8.1 (H) 07/03/2020       BNP (last 3 results) No results for input(s): BNP in the last 8760 hours.  ProBNP (last 3 results) No results for input(s): PROBNP in the last 8760 hours.   Other Studies Reviewed Today:  ECHO IMPRESSIONS 06/2020   1. Left ventricular ejection fraction, by estimation, is 60 to 65%. The  left ventricle has normal function. The left ventricle has no regional  wall motion abnormalities. Left ventricular diastolic parameters were  normal.   2. Right ventricular systolic function is normal. The right ventricular  size is normal.   3. Right atrial size was mildly dilated.   4. The mitral valve is normal in structure. Mild mitral valve  regurgitation.   5. The aortic valve is grossly normal. There is mild thickening of the  aortic valve. Aortic valve regurgitation is not visualized.   6. The inferior vena cava is normal in size with greater than 50%  respiratory variability, suggesting right atrial pressure of 3 mmHg.      LEFT HEART CATH AND CORONARY ANGIOGRAPHY 06/2020   Conclusion  1.  Patent left main with no obstruction 2.  Mild diffuse nonobstructive LAD stenosis involving the ostium and proximal vessel as well as segmental nonobstructive stenosis in the mid vessel.  There is moderate stenosis of the first diagonal branch of the LAD, not well suited for PCI. 3.  Mild plaquing in the distal circumflex without any significant stenoses 4.  Mild irregularity in the RCA (dominant vessel) with no  significant obstruction 5.  Normal LV systolic function with no regional wall motion abnormalities and LVEF estimated at 55 to 65% 6.  Dense pericardial calcification most prominent along the lateral aspect of the heart   Recommend: 2D echocardiogram.  As long as no significant abnormalities or evidence of pericardial constriction, would anticipate medical therapy for nonobstructive coronary artery disease.    Assessment/Plan:  1. Chest pain - s/p recent cath with moderate DX disease - not amenable to PCI - suspect some degree of small vessel/diabetic disease - HR too fast - adding Lopressor 25 mg BID today. Could consider nitrates - not on ED drugs. Aggressive risk factor modification imperative. NTG sl also given today. Will hold on COVID booster til we get his symptoms under better control.   2. Pericardial calcification - felt to be due to past pericarditis - no therapy felt to be warranted.   3. HLD - on statin therapy.   4. DM - poorly controlled - has had recent adjustment in his medicines. Needs aggressive control.   5. HTN - BP is ok today - adding Lopressor.    Current medicines are reviewed with the patient today.  The patient does not have concerns regarding medicines other than what has been noted above.  The following changes have been made:  See above.  Labs/ tests ordered today include:   No orders of the defined types were placed in this encounter.    Disposition:   FU with Dr. Burt Knack in about a month. Consider nitrates if needed.    Patient is agreeable to this plan and will call if any problems develop in the interim.   SignedTruitt Merle, NP  07/24/2020 2:46 PM  [facility] [address] [place], [place]  [address] Phone: [phone] Fax: [phone]

## 2020-07-24 ENCOUNTER — Other Ambulatory Visit: Payer: Self-pay

## 2020-07-24 ENCOUNTER — Ambulatory Visit (INDEPENDENT_AMBULATORY_CARE_PROVIDER_SITE_OTHER): Payer: BC Managed Care – PPO | Admitting: Nurse Practitioner

## 2020-07-24 ENCOUNTER — Encounter: Payer: Self-pay | Admitting: Nurse Practitioner

## 2020-07-24 VITALS — BP 124/82 | HR 102 | Ht 65.0 in | Wt 199.2 lb

## 2020-07-24 DIAGNOSIS — E782 Mixed hyperlipidemia: Secondary | ICD-10-CM

## 2020-07-24 DIAGNOSIS — Z9889 Other specified postprocedural states: Secondary | ICD-10-CM

## 2020-07-24 DIAGNOSIS — I259 Chronic ischemic heart disease, unspecified: Secondary | ICD-10-CM | POA: Diagnosis not present

## 2020-07-24 DIAGNOSIS — I10 Essential (primary) hypertension: Secondary | ICD-10-CM | POA: Diagnosis not present

## 2020-07-24 MED ORDER — METOPROLOL TARTRATE 25 MG PO TABS: 25.0000 mg | ORAL_TABLET | Freq: Two times a day (BID) | ORAL | 3 refills | Status: DC

## 2020-07-24 MED ORDER — NITROGLYCERIN 0.4 MG SL SUBL: 0.4000 mg | SUBLINGUAL_TABLET | SUBLINGUAL | 3 refills | Status: AC | PRN

## 2020-07-24 NOTE — Patient Instructions (Signed)
After Visit Summary:  We will be checking the following labs today - NONE   Medication Instructions:    Continue with your current medicines. BUT  I am adding Lopressor 25 mg to take twice a day - this will help slow your heart rate down and hopefully have less chest pain I am sending in a RX for NTG - Use your NTG under your tongue for recurrent chest pain. May take one tablet every 5 minutes. If you are still having discomfort after 3 tablets in 15 minutes, call 911.    If you need a refill on your cardiac medications before your next appointment, please call your pharmacy.     Testing/Procedures To Be Arranged:  N/A  Follow-Up:   See Dr. Excell Seltzer or me in about a month    At [facility], you and your health needs are our priority.  As part of our continuing mission to provide you with exceptional heart care, we have created designated Provider Care Teams.  These Care Teams include your primary Cardiologist (physician) and Advanced Practice Providers (APPs -  Physician Assistants and Nurse Practitioners) who all work together to provide you with the care you need, when you need it.  Special Instructions:  . Stay safe, wash your hands for at least 20 seconds and wear a mask when needed.  . It was good to talk with you today.    Call the [name] office at [phone] if you have any questions, problems or concerns.

## 2020-08-22 ENCOUNTER — Telehealth: Payer: Self-pay

## 2020-08-22 NOTE — Telephone Encounter (Signed)
Per patient's DPR (wife), cancelled appointment 2/16 with Dr. Burt Knack. She states the patient feels fine after starting metoprolol and wishes to cancel.

## 2020-09-06 ENCOUNTER — Ambulatory Visit: Payer: BC Managed Care – PPO | Admitting: Cardiovascular Disease

## 2020-09-28 DIAGNOSIS — E1169 Type 2 diabetes mellitus with other specified complication: Secondary | ICD-10-CM | POA: Diagnosis not present

## 2020-09-28 DIAGNOSIS — E785 Hyperlipidemia, unspecified: Secondary | ICD-10-CM | POA: Diagnosis not present

## 2020-09-28 DIAGNOSIS — E1165 Type 2 diabetes mellitus with hyperglycemia: Secondary | ICD-10-CM | POA: Diagnosis not present

## 2020-09-28 DIAGNOSIS — Z794 Long term (current) use of insulin: Secondary | ICD-10-CM | POA: Diagnosis not present

## 2020-09-28 DIAGNOSIS — E1140 Type 2 diabetes mellitus with diabetic neuropathy, unspecified: Secondary | ICD-10-CM | POA: Diagnosis not present

## 2020-11-30 DIAGNOSIS — E7849 Other hyperlipidemia: Secondary | ICD-10-CM | POA: Diagnosis not present

## 2020-11-30 DIAGNOSIS — E6609 Other obesity due to excess calories: Secondary | ICD-10-CM | POA: Diagnosis not present

## 2020-11-30 DIAGNOSIS — F321 Major depressive disorder, single episode, moderate: Secondary | ICD-10-CM | POA: Diagnosis not present

## 2020-11-30 DIAGNOSIS — E1065 Type 1 diabetes mellitus with hyperglycemia: Secondary | ICD-10-CM | POA: Diagnosis not present

## 2020-11-30 DIAGNOSIS — Z6833 Body mass index (BMI) 33.0-33.9, adult: Secondary | ICD-10-CM | POA: Diagnosis not present

## 2020-11-30 DIAGNOSIS — Z0001 Encounter for general adult medical examination with abnormal findings: Secondary | ICD-10-CM | POA: Diagnosis not present

## 2020-11-30 DIAGNOSIS — Z23 Encounter for immunization: Secondary | ICD-10-CM | POA: Diagnosis not present

## 2020-11-30 DIAGNOSIS — I2510 Atherosclerotic heart disease of native coronary artery without angina pectoris: Secondary | ICD-10-CM | POA: Diagnosis not present

## 2020-11-30 DIAGNOSIS — M549 Dorsalgia, unspecified: Secondary | ICD-10-CM | POA: Diagnosis not present

## 2020-11-30 DIAGNOSIS — Z1389 Encounter for screening for other disorder: Secondary | ICD-10-CM | POA: Diagnosis not present

## 2020-12-06 DIAGNOSIS — E113219 Type 2 diabetes mellitus with mild nonproliferative diabetic retinopathy with macular edema, unspecified eye: Secondary | ICD-10-CM | POA: Diagnosis not present

## 2020-12-20 ENCOUNTER — Telehealth: Payer: Self-pay

## 2020-12-20 DIAGNOSIS — G4733 Obstructive sleep apnea (adult) (pediatric): Secondary | ICD-10-CM

## 2020-12-20 DIAGNOSIS — Z9989 Dependence on other enabling machines and devices: Secondary | ICD-10-CM

## 2020-12-20 NOTE — Telephone Encounter (Signed)
-----   Message from Sueanne Margarita, MD sent at 12/20/2020 11:00 AM EDT ----- psg ----- Message ----- From: Antonieta Iba, RN Sent: 12/20/2020  10:47 AM EDT To: Sueanne Margarita, MD  What kind? ----- Message ----- From: Sueanne Margarita, MD Sent: 12/20/2020  10:45 AM EDT To: Antonieta Iba, RN  Yes please ----- Message ----- From: Antonieta Iba, RN Sent: 12/20/2020  10:16 AM EDT To: Sueanne Margarita, MD, Freada Bergeron, CMA  Patient has been referred to you for sleep apnea. He used to be seen at [facility] but it has been awhile and would prefer to be seen here. It looks like his last sleep study was in 2014. He has not been using his CPAP due to inability to tolerate the full face mask. Do you want to order a sleep study prior to being seen?

## 2020-12-20 NOTE — Telephone Encounter (Signed)
PSG sleep study has been ordered.

## 2020-12-29 DIAGNOSIS — E1169 Type 2 diabetes mellitus with other specified complication: Secondary | ICD-10-CM | POA: Diagnosis not present

## 2020-12-29 DIAGNOSIS — E785 Hyperlipidemia, unspecified: Secondary | ICD-10-CM | POA: Diagnosis not present

## 2020-12-29 DIAGNOSIS — E1140 Type 2 diabetes mellitus with diabetic neuropathy, unspecified: Secondary | ICD-10-CM | POA: Diagnosis not present

## 2020-12-29 DIAGNOSIS — E1165 Type 2 diabetes mellitus with hyperglycemia: Secondary | ICD-10-CM | POA: Diagnosis not present

## 2020-12-29 DIAGNOSIS — Z794 Long term (current) use of insulin: Secondary | ICD-10-CM | POA: Diagnosis not present

## 2020-12-29 DIAGNOSIS — E113219 Type 2 diabetes mellitus with mild nonproliferative diabetic retinopathy with macular edema, unspecified eye: Secondary | ICD-10-CM | POA: Diagnosis not present

## 2021-01-04 ENCOUNTER — Encounter: Payer: Self-pay | Admitting: *Deleted

## 2021-01-04 NOTE — Telephone Encounter (Signed)
Patient needs an office visit per his insurance so we have chart notes to send to the insurance company.

## 2021-01-04 NOTE — Telephone Encounter (Signed)
This encounter was created in error - please disregard.

## 2021-01-10 ENCOUNTER — Emergency Department (HOSPITAL_COMMUNITY)
Admission: EM | Admit: 2021-01-10 | Discharge: 2021-01-10 | Disposition: A | Discharge status: Home / Self Care | Payer: BC Managed Care – PPO | Attending: Emergency Medicine | Admitting: Emergency Medicine

## 2021-01-10 ENCOUNTER — Other Ambulatory Visit: Payer: Self-pay

## 2021-01-10 ENCOUNTER — Emergency Department (HOSPITAL_COMMUNITY): Payer: BC Managed Care – PPO

## 2021-01-10 DIAGNOSIS — R531 Weakness: Secondary | ICD-10-CM

## 2021-01-10 DIAGNOSIS — E119 Type 2 diabetes mellitus without complications: Secondary | ICD-10-CM | POA: Insufficient documentation

## 2021-01-10 DIAGNOSIS — Z79899 Other long term (current) drug therapy: Secondary | ICD-10-CM | POA: Insufficient documentation

## 2021-01-10 DIAGNOSIS — Z87891 Personal history of nicotine dependence: Secondary | ICD-10-CM | POA: Diagnosis not present

## 2021-01-10 DIAGNOSIS — Z20822 Contact with and (suspected) exposure to covid-19: Secondary | ICD-10-CM | POA: Diagnosis not present

## 2021-01-10 DIAGNOSIS — R29810 Facial weakness: Secondary | ICD-10-CM | POA: Diagnosis not present

## 2021-01-10 DIAGNOSIS — R6889 Other general symptoms and signs: Secondary | ICD-10-CM | POA: Diagnosis not present

## 2021-01-10 DIAGNOSIS — R079 Chest pain, unspecified: Secondary | ICD-10-CM | POA: Diagnosis not present

## 2021-01-10 DIAGNOSIS — R9431 Abnormal electrocardiogram [ECG] [EKG]: Secondary | ICD-10-CM | POA: Diagnosis not present

## 2021-01-10 DIAGNOSIS — Z743 Need for continuous supervision: Secondary | ICD-10-CM | POA: Diagnosis not present

## 2021-01-10 DIAGNOSIS — Z7982 Long term (current) use of aspirin: Secondary | ICD-10-CM | POA: Insufficient documentation

## 2021-01-10 DIAGNOSIS — R29898 Other symptoms and signs involving the musculoskeletal system: Secondary | ICD-10-CM | POA: Diagnosis not present

## 2021-01-10 DIAGNOSIS — J45909 Unspecified asthma, uncomplicated: Secondary | ICD-10-CM | POA: Diagnosis not present

## 2021-01-10 DIAGNOSIS — I10 Essential (primary) hypertension: Secondary | ICD-10-CM | POA: Diagnosis not present

## 2021-01-10 DIAGNOSIS — R4781 Slurred speech: Secondary | ICD-10-CM | POA: Diagnosis not present

## 2021-01-10 DIAGNOSIS — Z794 Long term (current) use of insulin: Secondary | ICD-10-CM | POA: Diagnosis not present

## 2021-01-10 DIAGNOSIS — R29818 Other symptoms and signs involving the nervous system: Secondary | ICD-10-CM | POA: Diagnosis not present

## 2021-01-10 DIAGNOSIS — I639 Cerebral infarction, unspecified: Secondary | ICD-10-CM | POA: Insufficient documentation

## 2021-01-10 LAB — CBC
HCT: 44.7 % (ref 39.0–52.0)
Hemoglobin: 15.2 g/dL (ref 13.0–17.0)
MCH: 30.5 pg (ref 26.0–34.0)
MCHC: 34 g/dL (ref 30.0–36.0)
MCV: 89.8 fL (ref 80.0–100.0)
Platelets: 285 10*3/uL (ref 150–400)
RBC: 4.98 MIL/uL (ref 4.22–5.81)
RDW: 13.1 % (ref 11.5–15.5)
WBC: 9.7 10*3/uL (ref 4.0–10.5)
nRBC: 0 % (ref 0.0–0.2)

## 2021-01-10 LAB — COMPREHENSIVE METABOLIC PANEL
ALT: 31 U/L (ref 0–44)
AST: 21 U/L (ref 15–41)
Albumin: 3.7 g/dL (ref 3.5–5.0)
Alkaline Phosphatase: 87 U/L (ref 38–126)
Anion gap: 11 (ref 5–15)
BUN: 12 mg/dL (ref 6–20)
CO2: 23 mmol/L (ref 22–32)
Calcium: 9.2 mg/dL (ref 8.9–10.3)
Chloride: 103 mmol/L (ref 98–111)
Creatinine, Ser: 0.92 mg/dL (ref 0.61–1.24)
GFR, Estimated: >60 mL/min (ref >60)
Glucose, Bld: 180 mg/dL — ABNORMAL HIGH (ref 70–99)
Potassium: 3.4 mmol/L — ABNORMAL LOW (ref 3.5–5.1)
Sodium: 137 mmol/L (ref 135–145)
Total Bilirubin: 0.7 mg/dL (ref 0.3–1.2)
Total Protein: 6.8 g/dL (ref 6.5–8.1)

## 2021-01-10 LAB — DIFFERENTIAL
Abs Immature Granulocytes: 0.03 10*3/uL (ref 0.00–0.07)
Basophils Absolute: 0 10*3/uL (ref 0.0–0.1)
Basophils Relative: 0 %
Eosinophils Absolute: 0.1 10*3/uL (ref 0.0–0.5)
Eosinophils Relative: 1 %
Immature Granulocytes: 0 %
Lymphocytes Relative: 26 %
Lymphs Abs: 2.5 10*3/uL (ref 0.7–4.0)
Monocytes Absolute: 0.9 10*3/uL (ref 0.1–1.0)
Monocytes Relative: 10 %
Neutro Abs: 6.1 10*3/uL (ref 1.7–7.7)
Neutrophils Relative %: 63 %

## 2021-01-10 LAB — PROTIME-INR
INR: 1 (ref 0.8–1.2)
Prothrombin Time: 12.7 seconds (ref 11.4–15.2)

## 2021-01-10 LAB — URINALYSIS, ROUTINE W REFLEX MICROSCOPIC
Bilirubin Urine: NEGATIVE
Glucose, UA: 50 mg/dL — AB
Hgb urine dipstick: NEGATIVE
Ketones, ur: NEGATIVE mg/dL
Leukocytes,Ua: NEGATIVE
Nitrite: NEGATIVE
Protein, ur: NEGATIVE mg/dL
Specific Gravity, Urine: 1.01 (ref 1.005–1.030)
pH: 6 (ref 5.0–8.0)

## 2021-01-10 LAB — CBG MONITORING, ED: Glucose-Capillary: 190 mg/dL — ABNORMAL HIGH (ref 70–99)

## 2021-01-10 LAB — RESP PANEL BY RT-PCR (FLU A&B, COVID) ARPGX2
Influenza A by PCR: NEGATIVE
Influenza B by PCR: NEGATIVE
SARS Coronavirus 2 by RT PCR: NEGATIVE

## 2021-01-10 LAB — I-STAT CHEM 8, ED
BUN: 11 mg/dL (ref 6–20)
Calcium, Ion: 1.11 mmol/L — ABNORMAL LOW (ref 1.15–1.40)
Chloride: 104 mmol/L (ref 98–111)
Creatinine, Ser: 0.8 mg/dL (ref 0.61–1.24)
Glucose, Bld: 181 mg/dL — ABNORMAL HIGH (ref 70–99)
HCT: 47 % (ref 39.0–52.0)
Hemoglobin: 16 g/dL (ref 13.0–17.0)
Potassium: 3.3 mmol/L — ABNORMAL LOW (ref 3.5–5.1)
Sodium: 139 mmol/L (ref 135–145)
TCO2: 23 mmol/L (ref 22–32)

## 2021-01-10 LAB — TROPONIN I (HIGH SENSITIVITY)
Troponin I (High Sensitivity): 5 ng/L (ref <18)
Troponin I (High Sensitivity): 3 ng/L (ref <18)

## 2021-01-10 LAB — APTT: aPTT: 26 seconds (ref 24–36)

## 2021-01-10 MED ORDER — LORAZEPAM 2 MG/ML IJ SOLN
1.0000 mg | Freq: Once | INTRAMUSCULAR | Status: AC | PRN
  Administered 2021-01-10: 1 mg via INTRAVENOUS
  Filled 2021-01-10: qty 1

## 2021-01-10 MED ORDER — SODIUM CHLORIDE 0.9% FLUSH
3.0000 mL | Freq: Once | INTRAVENOUS | Status: DC
Start: 2021-01-10 — End: 2021-01-11

## 2021-01-10 NOTE — Consult Note (Signed)
NEUROLOGY CONSULTATION NOTE   Date of service: January 10, 2021 Patient Name: Levi Meyers MRN:  [id number] DOB:  04/11/1966 Reason for consult: "Stroke code, left sided weakness" Requesting Provider: Wyvonnia Dusky, MD _ _ _   _ __   _ __ _ _  __ __   _ __   __ _  History of Present Illness  [name] is a 55 y.o. male with PMH significant for DM2, HTN, HLD, OSA, kidney stones who was brought in as a stroke code for left sided weakness at work.  On further questioning, reports that symptoms have been ongoing since Monday and he got better and then worsened again at 1400 on 01/10/21.  Noted to have inconsistencies on exam with some give away component. Facial droop would disappear when he is talking, unable to lift his left arm up but is able to lift it up and touch his nose with no difficutly, noted to have difficulty holding his left leg up but when asked to bend his left knee, would lift the entire left leg and bend the knee. Endorses some L sided numbness.  mRS:0 LKW: 01/07/21 tPA/Thrombectomy: not offered 2/2 outside window, inconsistencies on exam and functional neurologic weakness more likely than stroke. NIHSS components Score: Comment  1a Level of Conscious 0[x]  1[]  2[]  3[]      1b LOC Questions 0[x]  1[]  2[]       1c LOC Commands 0[x]  1[]  2[]       2 Best Gaze 0[x]  1[]  2[]       3 Visual 0[x]  1[]  2[]  3[]      4 Facial Palsy 0[]  1[x]  2[]  3[]      5a Motor Arm - left 0[]  1[]  2[x]  3[]  4[]  UN[]    5b Motor Arm - Right 0[x]  1[]  2[]  3[]  4[]  UN[]    6a Motor Leg - Left 0[]  1[x]  2[]  3[]  4[]  UN[]    6b Motor Leg - Right 0[x]  1[]  2[]  3[]  4[]  UN[]    7 Limb Ataxia 0[x]  1[]  2[]  3[]  UN[]     8 Sensory 0[]  1[x]  2[]  UN[]      9 Best Language 0[x]  1[]  2[]  3[]      10 Dysarthria 0[x]  1[]  2[]  UN[]      11 Extinct. and Inattention 0[x]  1[]  2[]       TOTAL: 5        ROS   Constitutional Denies weight loss, fever and chills.   HEENT Denies changes in vision and hearing.   Respiratory Denies SOB  and cough.   CV Denies palpitations and CP   GI Denies abdominal pain, nausea, vomiting and diarrhea.   GU Denies dysuria and urinary frequency.   MSK Denies myalgia and joint pain.   Skin Denies rash and pruritus.   Neurological Denies headache and syncope.   Psychiatric Denies recent changes in mood. Denies anxiety and depression.    Past History   Past Medical History:  Diagnosis Date   Asthma    Depression    Diabetes mellitus    Diabetes mellitus without complication (HCC)    GERD (gastroesophageal reflux disease)    High cholesterol    History of kidney stones    Hyperlipidemia    Hypertension    Kidney disease    stones   Kidney stone    Noncompliance with diabetes treatment    Sleep apnea    cpap   Past Surgical History:  Procedure Laterality Date   ANTERIOR CERVICAL DECOMP/DISCECTOMY FUSION N/A 06/24/2014   Procedure: Cervical five-six anterior  cervical decompression with fusion interbody prosthesis with plating and bonegraft;  Surgeon: Consuella Lose, MD;  Location: [facility];  Service: Neurosurgery;  Laterality: N/A;  Cervical five-six anterior cervical decompression with fusion interbody prosthesis with plating and bonegraft   APPENDECTOMY  2004   APPENDECTOMY     CARDIAC CATHETERIZATION     Dr. Verl Blalock ([place])   CERVICAL SPINE SURGERY     CHOLECYSTECTOMY  2004   CHOLECYSTECTOMY     COLONOSCOPY N/A 09/08/2012   Procedure: COLONOSCOPY;  Surgeon: Leighton Ruff, MD;  Location: [facility];  Service: Endoscopy;  Laterality: N/A;   EYE SURGERY  1970   EYE SURGERY     HERNIA REPAIR     KNEE CARTILAGE SURGERY     LEFT HEART CATH AND CORONARY ANGIOGRAPHY N/A 07/03/2020   Procedure: LEFT HEART CATH AND CORONARY ANGIOGRAPHY;  Surgeon: Sherren Mocha, MD;  Location: [facility];  Service: Cardiovascular;  Laterality: N/A;   LEG SURGERY     SKIN GRAFT  1974   SKIN GRAFT FULL THICKNESS LEG     STRABISMUS SURGERY     Family History  Problem Relation Age  of Onset   Diabetes Mother    Heart attack Mother    Stroke Mother    Diabetes Father    Heart disease Other        Parent   Heart disease Other        Grandparents   Social History   Socioeconomic History   Marital status: Married    Spouse name: Not on file   Number of children: 1   Years of education: Not on file   Highest education level: Not on file  Occupational History   Occupation: Unemployed  Tobacco Use   Smoking status: Former    Years: 4.00    Pack years: 0.00    Types: Cigarettes   Smokeless tobacco: Former  Substance and Sexual Activity   Alcohol use: No   Drug use: No   Sexual activity: Not on file  Other Topics Concern   Not on file  Social History Narrative   ** Merged History Encounter **       Regular exercise-yes   Caffeine Use-no         Social Determinants of Health   Financial Resource Strain: Not on file  Food Insecurity: Not on file  Transportation Needs: Not on file  Physical Activity: Not on file  Stress: Not on file  Social Connections: Not on file   Allergies  Allergen Reactions   Byetta 10 Mcg Pen [Exenatide] Nausea And Vomiting   Codeine Nausea And Vomiting   Invokana [Canagliflozin]    Victoza [Liraglutide] Nausea And Vomiting    Medications  (Not in a hospital admission)    Vitals   Vitals:   01/10/21 1545 01/10/21 1600 01/10/21 1608  BP: (!) 164/100 (!) 152/95   Pulse:  75   Resp:  17   SpO2:  95%   Height:   5\' 5"  (1.651 m)     Body mass index is 33.15 kg/m.  Physical Exam   General: Laying comfortably in bed; in no acute distress.  HENT: Normal oropharynx and mucosa. Normal external appearance of ears and nose.  Neck: Supple, no pain or tenderness  CV: No JVD. No peripheral edema.  Pulmonary: Symmetric Chest rise. Normal respiratory effort.  Abdomen: Soft to touch, non-tender.  Ext: No cyanosis, edema, or deformity  Skin: No rash. Normal palpation of skin.  Musculoskeletal: Normal digits and  nails by inspection. No clubbing.   Neurologic Examination  Mental status/Cognition: Alert, oriented to self, place, month and year, good attention.  Speech/language: Fluent, comprehension intact, object naming intact, repetition intact.  Cranial nerves:   CN II Pupils equal and reactive to light, no VF deficits    CN III,IV,VI EOM intact, no gaze preference or deviation, no nystagmus    CN V normal sensation in V1, V2, and V3 segments bilaterally    CN VII L lower facial droop disappears when distracted or talking.   CN VIII normal hearing to speech    CN IX & X normal palatal elevation, no uvular deviation   CN XI 5/5 head turn and 5/5 shoulder shrug bilaterally   CN XII midline tongue protrusion   Motor:  Muscle bulk: normal, tone normal, pronator drift yes LUE, tremor none Mvmt Root Nerve  Muscle Right Left Comments  SA C5/6 Ax Deltoid 5 4   EF C5/6 Mc Biceps 5 4   EE C6/7/8 Rad Triceps 5 4   WF C6/7 Med FCR     WE C7/8 PIN ECU     F Ab C8/T1 U ADM/FDI 5 4   HF L1/2/3 Fem Illopsoas 5 4   KE L2/3/4 Fem Quad 5 4   DF L4/5 D Peron Tib Ant 5 5   PF S1/2 Tibial Grc/Sol 5 5    Reflexes:  Right Left Comments  Pectoralis      Biceps (C5/6) 1 1   Brachioradialis (C5/6) 1 1    Triceps (C6/7) 1 1    Patellar (L3/4) 1 1    Achilles (S1)      Hoffman      Plantar     Jaw jerk    Sensation:  Light touch Decreased in LUE and LLE to touch   Pin prick    Temperature    Vibration   Proprioception    Coordination/Complex Motor:  - Finger to Nose intact BL - Heel to shin unable to assess. - Rapid alternating movement are slowed in LUE - Gait: unsafe to assess at this time.  Labs   CBC:  Recent Labs  Lab 01/10/21 1536 01/10/21 1545  WBC 9.7  --   NEUTROABS 6.1  --   HGB 15.2 16.0  HCT 44.7 47.0  MCV 89.8  --   PLT 285  --     Basic Metabolic Panel:  Lab Results  Component Value Date   NA 139 01/10/2021   K 3.3 (L) 01/10/2021   CO2 23 07/04/2020   GLUCOSE 181  (H) 01/10/2021   BUN 11 01/10/2021   CREATININE 0.80 01/10/2021   CALCIUM 9.6 07/04/2020   GFRNONAA >60 07/04/2020   GFRAA >60 02/24/2020   Lipid Panel:  Lab Results  Component Value Date   LDLCALC 45 03/20/2017   HgbA1c:  Lab Results  Component Value Date   HGBA1C 8.1 (H) 07/03/2020   Urine Drug Screen:     Component Value Date/Time   LABOPIA NONE DETECTED 04/08/2011 2339   COCAINSCRNUR NONE DETECTED 04/08/2011 2339   LABBENZ NONE DETECTED 04/08/2011 2339   AMPHETMU NONE DETECTED 04/08/2011 2339   THCU NONE DETECTED 04/08/2011 2339   LABBARB NONE DETECTED 04/08/2011 2339    Alcohol Level     Component Value Date/Time   ETH <11 04/08/2011 2330    CT Head without contrast(personally reviewed): CTH was negative for a large hypodensity concerning for a large territory infarct or hyperdensity  concerning for an [place]  Impression   PARMVIR BOOMER is a 55 y.o. male with PMH significant for DM2, HTN, HLD, OSA, kidney stones who was brought in as a stroke code for left sided weakness at work. Upon further questioning, symptoms have been ongoing since 01/08/21 His neurologic examination is notable for left facial droop that goes away when distracted, LUE and LLE weakness with significant giveaway weakness.  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  Recommendations  - Recommend MRI Brain without contrast, if it is positive for a stroke, recommend full stroke workup. ______________________________________________________________________   Thank you for the opportunity to take part in the care of this patient. If you have any further questions, please contact the neurology consultation attending.  Signed,  [facility] Pager Number [phone] _ _ _   _ __   _ __ _ _  __ __   _ __   __ _

## 2021-01-10 NOTE — ED Notes (Signed)
Patient transported to MRI 

## 2021-01-10 NOTE — Code Documentation (Signed)
Stroke Response Nurse Documentation Code Documentation  CARREL LEATHER is a 55 y.o. male arriving to [facility] ED via [facility] on 01/10/2021 with past medical hx of diabetes. Code stroke was activated by EMS. Patient from home where he was LKW at Sunday night when he went to bed. Pt reports that he woke up on Monday and was a little confused and noted left sided weakness. It got better over some time, but did not fully resolve. Today. Pt noted that his weakness got worse at 1400 and he called his daughter. Daughter called wide and they called EMS. EMS was told that the symptoms started at 1400 and activated a Code Stroke.    Stroke team at the bedside on patient arrival. Labs drawn and patient cleared for CT by Dr. Roslynn Amble. Patient to CT with team. NIHSS 5, see documentation for details and code stroke times. Patient with left arm weakness, left leg weakness, and left decreased sensation on exam. The following imaging was completed:  CT. Patient is not a candidate for tPA due to being outside window.    Care/Plan: q2 mNIHSS/VS. Workup pending MRI Results. Bedside handoff with ED RN [place].    Kathrin Greathouse  Stroke Response RN

## 2021-01-10 NOTE — ED Provider Notes (Signed)
[facility] EMERGENCY DEPARTMENT Provider Note   CSN: [id number] Arrival date & time: 01/10/21  1533  An emergency department physician performed an initial assessment on this suspected stroke patient at 1534.  History CC: Left sided weakness   Levi Meyers is a 54 y.o. male with history of hypertension, HLD, diabetes, presented to the ED as a code stroke with left-sided weakness.  Onset of symptoms was approximately 2 PM today while the patient was at work.  He describes weakness in his left arm and leg and a left-sided facial droop.  No prior history of strokes reported.  He does also reports mild left-sided chest pressure.  He reports has been feeling "bad since Monday".  [place] echo Dec 2021 with EF 60-65%, no regional wall motion abnormalities, no report of aortic dilation or aneurysm.  Hx of CAD, last LHC in Dec 2021 as noted below:  LEFT HEART CATH AND CORONARY ANGIOGRAPHY    Conclusion  1.  Patent left main with no obstruction 2.  Mild diffuse nonobstructive LAD stenosis involving the ostium and proximal vessel as well as segmental nonobstructive stenosis in the mid vessel.  There is moderate stenosis of the first diagonal branch of the LAD, not well suited for PCI. 3.  Mild plaquing in the distal circumflex without any significant stenoses 4.  Mild irregularity in the RCA (dominant vessel) with no significant obstruction 5.  Normal LV systolic function with no regional wall motion abnormalities and LVEF estimated at 55 to 65% 6.  Dense pericardial calcification most prominent along the lateral aspect of the heart   Recommend: 2D echocardiogram.  As long as no significant abnormalities or evidence of pericardial constriction, would anticipate medical therapy for nonobstructive coronary artery disease.  HPI     Past Medical History:  Diagnosis Date   Asthma    Depression    Diabetes mellitus    Diabetes mellitus without complication (HCC)    GERD  (gastroesophageal reflux disease)    High cholesterol    History of kidney stones    Hyperlipidemia    Hypertension    Kidney disease    stones   Kidney stone    Noncompliance with diabetes treatment    Sleep apnea    cpap    Patient Active Problem List   Diagnosis Date Noted   Chest pain 07/03/2020   Unstable angina ([place])    Type 2 diabetes mellitus without complication, with long-term current use of insulin (HCC)    Cervical spondylosis with radiculopathy 06/24/2014   OSA on CPAP 02/18/2013   DOE (dyspnea on exertion) 01/07/2013   Tachycardia 01/07/2013   Edema extremities 01/07/2013   Bipolar affective disorder ([place]) 03/31/2012   Type 1 diabetes mellitus with hypoglycemia ([place]) 11/22/2011   Amblyopia 11/22/2011   Bronchitis, acute 11/03/2011   DIABETES MELLITUS, TYPE I 04/28/2008   HYPERLIPIDEMIA 04/28/2008   RHEUMATIC FEVER 04/28/2008   Primary hypertension 04/28/2008   RENAL CALCULUS, HX OF 04/28/2008   CHICKENPOX, HX OF 04/28/2008    Past Surgical History:  Procedure Laterality Date   ANTERIOR CERVICAL DECOMP/DISCECTOMY FUSION N/A 06/24/2014   Procedure: Cervical five-six anterior cervical decompression with fusion interbody prosthesis with plating and bonegraft;  Surgeon: Consuella Lose, MD;  Location: [facility];  Service: Neurosurgery;  Laterality: N/A;  Cervical five-six anterior cervical decompression with fusion interbody prosthesis with plating and bonegraft   APPENDECTOMY  2004   APPENDECTOMY     CARDIAC CATHETERIZATION     Dr.  Wall ([place])   CERVICAL SPINE SURGERY     CHOLECYSTECTOMY  2004   CHOLECYSTECTOMY     COLONOSCOPY N/A 09/08/2012   Procedure: COLONOSCOPY;  Surgeon: Leighton Ruff, MD;  Location: [facility];  Service: Endoscopy;  Laterality: N/A;   EYE SURGERY  1970   EYE SURGERY     HERNIA REPAIR     KNEE CARTILAGE SURGERY     LEFT HEART CATH AND CORONARY ANGIOGRAPHY N/A 07/03/2020   Procedure: LEFT HEART CATH AND CORONARY ANGIOGRAPHY;   Surgeon: Sherren Mocha, MD;  Location: [facility];  Service: Cardiovascular;  Laterality: N/A;   LEG SURGERY     SKIN GRAFT  1974   SKIN GRAFT FULL THICKNESS LEG     STRABISMUS SURGERY         Family History  Problem Relation Age of Onset   Diabetes Mother    Heart attack Mother    Stroke Mother    Diabetes Father    Heart disease Other        Parent   Heart disease Other        Grandparents    Social History   Tobacco Use   Smoking status: Former    Years: 4.00    Pack years: 0.00    Types: Cigarettes   Smokeless tobacco: Former  Substance Use Topics   Alcohol use: No   Drug use: No    Home Medications Prior to Admission medications   Medication Sig Start Date End Date Taking? Authorizing Provider  aspirin 81 MG tablet Take 81 mg by mouth daily.   Yes [provider]  atorvastatin (LIPITOR) 20 MG tablet Take 20 mg by mouth daily.   Yes [provider]  buPROPion (WELLBUTRIN XL) 300 MG 24 hr tablet Take 300 mg by mouth daily.    Yes [provider]  Continuous Blood Gluc Sensor (FREESTYLE LIBRE 2 SENSOR) MISC 1 each by Other route every 14 (fourteen) days. 12/29/20  Yes [provider]  Continuous Blood Gluc Sensor (FREESTYLE LIBRE 2 SENSOR) MISC 1 each by Does not apply route every 14 (fourteen) days. 12/29/20  Yes [provider]  gabapentin (NEURONTIN) 300 MG capsule Take 600 mg by mouth 3 (three) times daily.   Yes [provider]  ibuprofen (ADVIL,MOTRIN) 200 MG tablet Take 400 mg by mouth 2 (two) times daily as needed (pain).   Yes [provider]  insulin aspart (NOVOLOG) 100 UNIT/ML FlexPen Inject 30 Units into the skin daily before supper.    Yes [provider]  insulin glargine (LANTUS) 100 UNIT/ML Solostar Pen Inject 60 Units into the skin 2 (two) times daily.    Yes [provider]  lisinopril (PRINIVIL,ZESTRIL) 5 MG tablet Take 5 mg by mouth daily.   Yes [provider]  metoprolol tartrate (LOPRESSOR) 25 MG tablet Take 1 tablet (25 mg total) by mouth 2 (two) times daily. 07/24/20 01/10/21 Yes Burtis Junes, NP  nitroGLYCERIN (NITROSTAT) 0.4 MG SL tablet Place 1 tablet (0.4 mg total) under the tongue every 5 (five) minutes as needed for chest pain. 07/24/20 01/10/21 Yes Burtis Junes, NP  traZODone (DESYREL) 100 MG tablet Take 100 mg by mouth at bedtime.   Yes [provider]  TRULICITY 3 OE/7.0JJ SOPN Inject 3 mg into the skin once a week. Mondays 12/01/20  Yes [provider]  dicyclomine (BENTYL) 20 MG tablet Take 1 tablet (20 mg total) by mouth 2 (two) times daily as  needed for spasms. Patient not taking: No sig reported 12/17/19   Hayden Rasmussen, MD    Allergies    Byetta 10 mcg pen [exenatide], Codeine, Invokana [canagliflozin], and Victoza [liraglutide]  Review of Systems   Review of Systems  Constitutional:  Negative for chills and fever.  Respiratory:  Negative for cough and shortness of breath.   Cardiovascular:  Negative for chest pain and palpitations.  Gastrointestinal:  Negative for abdominal pain and vomiting.  Musculoskeletal:  Negative for arthralgias and myalgias.  Neurological:  Positive for speech difficulty, weakness, light-headedness and numbness.  All other systems reviewed and are negative.  Physical Exam Updated Vital Signs BP (!) 156/97   Pulse 87   Temp 97.7 F (36.5 C) (Oral)   Resp (!) 21   Ht 5\' 5"  (1.651 m)   SpO2 96%   BMI 33.15 kg/m   Physical Exam Constitutional:      General: He is not in acute distress. HENT:     Head: Normocephalic and atraumatic.  Eyes:     Conjunctiva/sclera: Conjunctivae normal.     Pupils: Pupils are equal, round, and reactive to light.  Cardiovascular:     Rate and Rhythm: Normal rate and regular rhythm.     Pulses: Normal pulses.  Pulmonary:     Effort: Pulmonary effort is normal. No respiratory distress.  Skin:    General: Skin is warm and  dry.  Neurological:     General: No focal deficit present.     Mental Status: He is alert. Mental status is at baseline.     Comments: Left arm and left weakness Left sided facial droop Right sided gaze deviation    ED Results / Procedures / Treatments   Labs (all labs ordered are listed, but only abnormal results are displayed) Labs Reviewed  COMPREHENSIVE METABOLIC PANEL - Abnormal; Notable for the following components:      Result Value   Potassium 3.4 (*)    Glucose, Bld 180 (*)    All other components within normal limits  URINALYSIS, ROUTINE W REFLEX MICROSCOPIC - Abnormal; Notable for the following components:   Color, Urine STRAW (*)    Glucose, UA 50 (*)    All other components within normal limits  I-STAT CHEM 8, ED - Abnormal; Notable for the following components:   Potassium 3.3 (*)    Glucose, Bld 181 (*)    Calcium, Ion 1.11 (*)    All other components within normal limits  CBG MONITORING, ED - Abnormal; Notable for the following components:   Glucose-Capillary 190 (*)    All other components within normal limits  RESP PANEL BY RT-PCR (FLU A&B, COVID) ARPGX2  CULTURE, BLOOD (ROUTINE X 2)  CULTURE, BLOOD (ROUTINE X 2)  PROTIME-INR  APTT  CBC  DIFFERENTIAL  TROPONIN I (HIGH SENSITIVITY)  TROPONIN I (HIGH SENSITIVITY)    EKG EKG Interpretation  Date/Time:  Wednesday January 10 2021 15:59:00 EDT Ventricular Rate:  97 PR Interval:  165 QRS Duration: 92 QT Interval:  321 QTC Calculation: 408 R Axis:   84 Text Interpretation: Sinus rhythm Borderline T abnormalities, unchanged from 2021 ecg No STEMI Confirmed by Octaviano Glow [phone]) on 01/10/2021 4:07:12 PM  Radiology MR BRAIN WO CONTRAST  Result Date: 01/10/2021 CLINICAL DATA:  T8.  Left-sided weakness. EXAM: MRI HEAD WITHOUT CONTRAST TECHNIQUE: Multiplanar, multiecho pulse sequences of the brain and surrounding structures were obtained without intravenous contrast. COMPARISON:  Same day CT head.  FINDINGS: Mildly motion limited study.  Within this limitation: Brain: No acute infarction, hemorrhage, hydrocephalus, extra-axial collection or mass lesion. Mild T2/FLAIR hyperintensities within the white matter, nonspecific but most likely related to chronic microvascular ischemic disease that is not advanced for age. Vascular: Major arterial flow voids are maintained at the skull base. Skull and upper cervical spine: Normal marrow signal. Sinuses/Orbits: Mild pansinus mucosal thickening with retention cyst in the inferior right maxillary sinus. Unremarkable orbits. Other: No mastoid effusions. IMPRESSION: 1. No evidence of acute intracranial abnormality. Specifically, no acute infarct. 2. Mild chronic microvascular ischemic disease. Electronically Signed   By: Margaretha Sheffield MD   On: 01/10/2021 18:50   DG Chest Portable 1 View  Result Date: 01/10/2021 CLINICAL DATA:  Chest pain and facial droop, initial encounter EXAM: PORTABLE CHEST 1 VIEW COMPARISON:  07/03/2020 FINDINGS: Cardiac shadow remains enlarged. Lungs are clear bilaterally. No bony abnormality is seen. IMPRESSION: No acute abnormality noted. Electronically Signed   By: Inez Catalina M.D.   On: 01/10/2021 16:51   CT HEAD CODE STROKE WO CONTRAST  Result Date: 01/10/2021 MILD CALCIFIC ATHEROSCLEROSIS.  CLINICAL DATA: Code stroke.  Neuro deficit, acute stroke suspected. EXAM: CT HEAD WITHOUT CONTRAST TECHNIQUE: Contiguous axial images were obtained from the base of the skull through the vertex without intravenous contrast. COMPARISON:  CT head February 24, 2020 FINDINGS: Brain: No evidence of acute large vascular territory infarction, hemorrhage, hydrocephalus, extra-axial collection or mass lesion/mass effect. Vascular: No hyperdense vessel identified. Skull: No acute fracture Sinuses/Orbits: Mild inferior maxillary sinus mucosal thickening. No acute orbital abnormality. Other: No mastoid effusions. ASPECTS [facility] Stroke Program Early CT Score)  total score (0-10 with 10 being normal): 10. IMPRESSION: No evidence of acute large vascular territory infarct or acute hemorrhage. ASPECTS is 10. Code stroke imaging results were communicated on 01/10/2021 at 3:47 pm to provider Dr.Khaliqdina via secure text paging. Electronically Signed   By: Margaretha Sheffield MD   On: 01/10/2021 15:48    Procedures .Critical Care  Date/Time: 01/10/2021 3:56 PM Performed by: Wyvonnia Dusky, MD Authorized by: Wyvonnia Dusky, MD   Critical care provider statement:    Critical care time (minutes):  45   Critical care was necessary to treat or prevent imminent or life-threatening deterioration of the following conditions:  CNS failure or compromise   Critical care was time spent personally by me on the following activities:  Discussions with consultants, evaluation of patient's response to treatment, examination of patient, ordering and performing treatments and interventions, ordering and review of laboratory studies, ordering and review of radiographic studies, pulse oximetry, re-evaluation of patient's condition, obtaining history from patient or surrogate and review of old charts Comments:     Stroke alert evaluation, consultation with neurology, consideration of tPA (patient not felt to be a candidate)   Medications Ordered in ED Medications  sodium chloride flush (NS) 0.9 % injection 3 mL (3 mLs Intravenous Not Given 01/10/21 1636)  LORazepam (ATIVAN) injection 1 mg (1 mg Intravenous Given 01/10/21 1727)    ED Course  I have reviewed the triage vital signs and the nursing notes.  Pertinent labs & imaging results that were available during my care of the patient were reviewed by me and considered in my medical decision making (see chart for details).  This patient complains of left sided weakness.  This involves an extensive number of treatment options, and is a complaint that carries with it a high risk of complications and morbidity.  The  differential diagnosis includes stroke versus hypoglycemia versus  atypical ACS vs infection vs other  Patient is GCS 15 on arrival and steroids pain.  Neurology was present on arrival.  He was taken immediately to CT scan.  No hypoglycemia on arrival.  I ordered, reviewed, and interpreted labs, showing no evidence of acute anemia, significant dehydration, COVID or influenza, urinary infection, acute coronary syndrome.  Troponins are flat.  White blood cell count is normal.  Glucose was also normal on arrival. I ordered medication Ativan for anxiety for MRI scan. I ordered imaging studies which included x-ray of the chest, CT scan of the head, MRI of the brain. I independently visualized and interpreted imaging which showed no life-threatening abnormalities, no evidence of acute cerebrovascular accident or stroke, no widening of the mediastinum on x-ray of chest, and the monitor tracing which showed sinus rhythm Additional history was obtained from the patient's wife at bedside.  I consulted neurology and discussed lab and imaging findings, as the patient did arrive as a code stroke.  Neurologist noted the patient an inconsistent neurological exam.  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  He did recommend an MRI of the brain but if this was unremarkable he did not feel that this was a stroke or other emergent neurological process.   Clinical Course as of 01/10/21 2046  Wed Jan 10, 2021  1552 [facility] with no acute findings.  Dr Lorrin Goodell reports patient is not a candidate for tPA, has good strength in left arm and leg, symptoms are intermittent.  He requests MRI brain.  We'll also need to perform a workup for alternative causes of symptoms including ACS & infection [MT]  1607 EKG reviewed personally which shows no acute ischemic changes, T wave inversions noted on prior tracing [MT]  1656 I reassessed the patient.  He continues to complain of some mild left-sided chest pressure he said it was  similar to when he had "pericarditis last year".  He was told that he will continue to have chest pressure and does get it occasionally.  He also continues to have a right-sided facial droop.  His wife is now present at the bedside.  She reports that he had similar right-sided facial droop approximately 2 weeks ago, but at that time his blood sugars were "really low".  The droop resolved on its own. [MT]  7564 I discussed our work-up including plan for an MRI.  He was requesting some Ativan as he is claustrophobic. [MT]  1746 Troponin I (High Sensitivity): 5 [MT]  3329 Pt at MRI [MT]  1854 IMPRESSION: 1. No evidence of acute intracranial abnormality. Specifically, no acute infarct. 2. Mild chronic microvascular ischemic disease.  [MT]  1945 Patient reported near complete resolution of his symptoms.  I discussed his presentation with him and his wife.  It is unusual.  It is not clear to me whether he may be having complex migraines (he has a 3 out of 10 headache) or episodes of hypoglycemia, or another cause.  At this point I have a lower suspicion for stroke, ACS, aortic dissection, covid or other illness.  I discussed observation in the hospital with him and his wife versus close monitoring at home and return precautions.  They would prefer to go home.  At this time I think it is reasonable. [MT]  1950 Because he has been having 2 to 3 days of confusion and fatigue, we did discuss also sending blood cultures to rule out an occult bacteremia or infection.  He has no indications of sepsis  at this time.  However they are in agreement with this plan [MT]  1951 He otherwise has had resolution of his facial droop.  He has no objective weakness of the left side on my testing.  He is able to ambulate [MT]  1953 Troponin I (High Sensitivity): 3 [MT]    Clinical Course User Index [MT] Sariyah Corcino, Carola Rhine, MD    Final Clinical Impression(s) / ED Diagnoses Final diagnoses:  Facial droop  Left-sided weakness     Rx / DC Orders ED Discharge Orders          Ordered    Ambulatory referral to Neurology       Comments: An appointment is requested in approximately: 1 week Recurrent episodes of facial droop, left sided weakness - possible complex migraines   01/10/21 1954             Wyvonnia Dusky, MD 01/10/21 2046

## 2021-01-10 NOTE — Discharge Instructions (Addendum)
Your work-up today in the ER did not show signs of a stroke.  Your cardiac work-up was normal as well.    Your symptoms appear to have improved from when you arrived.  We talked about the possibility that these are complex migraines.  We also discussed the possibility that the symptoms are related to low blood sugar.  I advised that you keep juice or food with you at all times, and if the symptoms recur, to try to drink and eat something.  However, we talked about the fact that we do not completely know what is causing the symptoms.  We talked about the option of staying in the hospital observation overnight.  You preferred to go home with your wife and keep an eye on your symptoms at home.  Therefore, if the symptoms come back, or you have worsening weakness, you should call 911 and come back to the ER.  If your blood cultures come back positive for infection, you will get a phone call within 1 to 2 days on average, telling you to come back to the ER immediately.  I placed a referral to our neurology clinic.  They should reach out to you in 1-2 business days to set up a follow-up appointment.  If you do not hear from them at that time, please call the number above to schedule an appointment in their office.

## 2021-01-16 ENCOUNTER — Telehealth: Payer: Self-pay

## 2021-01-16 NOTE — Telephone Encounter (Signed)
Patient scheduled for virtual visit with Dr. Radford Pax.

## 2021-01-16 NOTE — Telephone Encounter (Signed)
-----   Message from Freada Bergeron, [place] sent at 01/04/2021  3:13 PM EDT ----- Regarding: Sleep Study Antonieta Iba, RN   PSG sleep study has been ordered.   Patient needs an office visit per his insurance so we have chart notes to send to the insurance company. Thanks, Gae Bon

## 2021-01-24 ENCOUNTER — Encounter: Payer: Self-pay | Admitting: Cardiology

## 2021-01-24 ENCOUNTER — Encounter: Payer: BC Managed Care – PPO | Admitting: Cardiology

## 2021-01-24 ENCOUNTER — Other Ambulatory Visit: Payer: Self-pay

## 2021-01-24 NOTE — Progress Notes (Signed)
This encounter was created in error - please disregard.

## 2021-02-08 ENCOUNTER — Ambulatory Visit: Payer: 59 | Admitting: Cardiology

## 2021-02-08 ENCOUNTER — Encounter: Payer: Self-pay | Admitting: Cardiology

## 2021-02-08 ENCOUNTER — Telehealth: Payer: Self-pay | Admitting: *Deleted

## 2021-02-08 ENCOUNTER — Other Ambulatory Visit: Payer: Self-pay | Admitting: Cardiology

## 2021-02-08 ENCOUNTER — Other Ambulatory Visit: Payer: Self-pay

## 2021-02-08 ENCOUNTER — Other Ambulatory Visit (INDEPENDENT_AMBULATORY_CARE_PROVIDER_SITE_OTHER): Payer: 59

## 2021-02-08 VITALS — BP 144/100 | HR 98 | Ht 65.0 in | Wt 205.2 lb

## 2021-02-08 DIAGNOSIS — I10 Essential (primary) hypertension: Secondary | ICD-10-CM

## 2021-02-08 DIAGNOSIS — G4733 Obstructive sleep apnea (adult) (pediatric): Secondary | ICD-10-CM | POA: Diagnosis not present

## 2021-02-08 DIAGNOSIS — Z9989 Dependence on other enabling machines and devices: Secondary | ICD-10-CM | POA: Diagnosis not present

## 2021-02-08 DIAGNOSIS — R030 Elevated blood-pressure reading, without diagnosis of hypertension: Secondary | ICD-10-CM | POA: Diagnosis not present

## 2021-02-08 NOTE — Progress Notes (Signed)
Cardiology Office Note:    Date:  02/08/2021   ID:  Levi Meyers, DOB 11/14/1965, MRN [id number]  PCP:  Sharilyn Sites, MD  Cardiologist:  None    Referring MD: Sharilyn Sites, MD   Chief Complaint  Patient presents with   Sleep Apnea   Hypertension     History of Present Illness:    Levi Meyers is a 55 y.o. male with a hx of HTN, DM, GERD, HLD and OSA on CPAP. He has not been followed by a sleep medicine MD for his OSA and is now referred to establish sleep care. He tells me that he has had a CPAP in the past but currently is not using it.  He has not used his CPAP in 2 years.  He says that the hose is not long enough.  He says that he snores very bad and his wife says he sounds like a freight train snoring.  He wakes up gasping for breath and occasionally will have headaches in the am.  He does have problems with getting sleepy at work and will fall asleep everytime he comes home from work and sits in a chair.  His last sleep study was in 2014 showing moderate OSA with an AHI of 13.5/hr overall and severe during REM sleep with [facility] of 37.3/hr.  O2 sats dropped to a low of 81%.  He was on auto CPAP in the past.   Past Medical History:  Diagnosis Date   Asthma    Depression    Diabetes mellitus    Diabetes mellitus without complication (HCC)    GERD (gastroesophageal reflux disease)    High cholesterol    History of kidney stones    Hyperlipidemia    Hypertension    Kidney disease    stones   Kidney stone    Noncompliance with diabetes treatment    Sleep apnea    cpap    Past Surgical History:  Procedure Laterality Date   ANTERIOR CERVICAL DECOMP/DISCECTOMY FUSION N/A 06/24/2014   Procedure: Cervical five-six anterior cervical decompression with fusion interbody prosthesis with plating and bonegraft;  Surgeon: Consuella Lose, MD;  Location: [facility];  Service: Neurosurgery;  Laterality: N/A;  Cervical five-six anterior cervical decompression with fusion interbody  prosthesis with plating and bonegraft   APPENDECTOMY  2004   APPENDECTOMY     CARDIAC CATHETERIZATION     Dr. Verl Blalock ([place])   CERVICAL SPINE SURGERY     CHOLECYSTECTOMY  2004   CHOLECYSTECTOMY     COLONOSCOPY N/A 09/08/2012   Procedure: COLONOSCOPY;  Surgeon: Leighton Ruff, MD;  Location: [facility];  Service: Endoscopy;  Laterality: N/A;   EYE SURGERY  1970   EYE SURGERY     HERNIA REPAIR     KNEE CARTILAGE SURGERY     LEFT HEART CATH AND CORONARY ANGIOGRAPHY N/A 07/03/2020   Procedure: LEFT HEART CATH AND CORONARY ANGIOGRAPHY;  Surgeon: Sherren Mocha, MD;  Location: [facility];  Service: Cardiovascular;  Laterality: N/A;   LEG SURGERY     SKIN GRAFT  1974   SKIN GRAFT FULL THICKNESS LEG     STRABISMUS SURGERY      Current Medications: Current Meds  Medication Sig   aspirin 81 MG tablet Take 81 mg by mouth daily.   atorvastatin (LIPITOR) 20 MG tablet Take 20 mg by mouth daily.   buPROPion (WELLBUTRIN XL) 300 MG 24 hr tablet Take 300 mg by mouth daily.    Continuous  Blood Gluc Sensor (FREESTYLE LIBRE 2 SENSOR) MISC 1 each by Other route every 14 (fourteen) days.   Continuous Blood Gluc Sensor (FREESTYLE LIBRE 2 SENSOR) MISC 1 each by Does not apply route every 14 (fourteen) days.   dicyclomine (BENTYL) 20 MG tablet Take 1 tablet (20 mg total) by mouth 2 (two) times daily as needed for spasms.   gabapentin (NEURONTIN) 300 MG capsule Take 600 mg by mouth 3 (three) times daily.   ibuprofen (ADVIL,MOTRIN) 200 MG tablet Take 400 mg by mouth 2 (two) times daily as needed (pain).   insulin aspart (NOVOLOG) 100 UNIT/ML FlexPen Inject 30 Units into the skin daily before supper.    insulin glargine (LANTUS) 100 UNIT/ML Solostar Pen Inject 60 Units into the skin 2 (two) times daily.    lisinopril (PRINIVIL,ZESTRIL) 5 MG tablet Take 5 mg by mouth daily.   traZODone (DESYREL) 100 MG tablet Take 100 mg by mouth at bedtime.   TRULICITY 3 WU/9.8JX SOPN Inject 3 mg into the skin once a  week. Mondays     Allergies:   Byetta 10 mcg pen [exenatide], Codeine, Invokana [canagliflozin], and Victoza [liraglutide]   Social History   Socioeconomic History   Marital status: Married    Spouse name: Not on file   Number of children: 1   Years of education: Not on file   Highest education level: Not on file  Occupational History   Occupation: Unemployed  Tobacco Use   Smoking status: Former    Years: 4.00    Types: Cigarettes   Smokeless tobacco: Former  Substance and Sexual Activity   Alcohol use: No   Drug use: No   Sexual activity: Not on file  Other Topics Concern   Not on file  Social History Narrative   ** Merged History Encounter **       Regular exercise-yes   Caffeine Use-no         Social Determinants of Health   Financial Resource Strain: Not on file  Food Insecurity: Not on file  Transportation Needs: Not on file  Physical Activity: Not on file  Stress: Not on file  Social Connections: Not on file     Family History: The patient's family history includes Diabetes in his father and mother; Heart attack in his mother; Heart disease in some other family members; Stroke in his mother.  ROS:   Please see the history of present illness.    ROS  All other systems reviewed and negative.   EKGs/Labs/Other Studies Reviewed:    The following studies were reviewed today: PAP compliance download  EKG:  EKG is not ordered today.    Recent Labs: 07/03/2020: Magnesium 2.2; TSH 0.924 01/10/2021: ALT 31; BUN 11; Creatinine, Ser 0.80; Hemoglobin 16.0; Platelets 285; Potassium 3.3; Sodium 139   Recent Lipid Panel    Component Value Date/Time   CHOL 101 03/20/2017 0836   TRIG 126 03/20/2017 0836   HDL 31 (L) 03/20/2017 0836   CHOLHDL 3.3 03/20/2017 0836   CHOLHDL 8.5 09/12/2008 0230   VLDL UNABLE TO CALCULATE IF TRIGLYCERIDE OVER 400 mg/dL 09/12/2008 0230   LDLCALC 45 03/20/2017 0836    CHA2DS2-VASc Score =   [ ] .  Therefore, the patient's annual  risk of stroke is   %.        Physical Exam:    VS:  BP (!) 144/100   Pulse 98   Ht 5\' 5"  (1.651 m)   Wt 205 lb 3.2 oz (93.1 kg)  SpO2 97%   BMI 34.15 kg/m     Wt Readings from Last 3 Encounters:  02/08/21 205 lb 3.2 oz (93.1 kg)  01/24/21 205 lb (93 kg)  07/24/20 199 lb 3.2 oz (90.4 kg)     GEN:  Well nourished, well developed in no acute distress HEENT: Normal NECK: No JVD; No carotid bruits LYMPHATICS: No lymphadenopathy CARDIAC: RRR, no murmurs, rubs, gallops RESPIRATORY:  Clear to auscultation without rales, wheezing or rhonchi  ABDOMEN: Soft, non-tender, non-distended MUSCULOSKELETAL:  No edema; No deformity  SKIN: Warm and dry NEUROLOGIC:  Alert and oriented x 3 PSYCHIATRIC:  Normal affect   ASSESSMENT:    1. OSA on CPAP   2. Primary hypertension    PLAN:    In order of problems listed above:   OSA  -he was dx with mild OSA overall with AHI 13/hr but severe during REM sleep at 37/hr and O2 desats as low as 81%.  -he has significant problems with excessive daytime sleepiness and snoring -I think he would benefit from PAP therapy -given he has not had a sleep eval in 8 years I will refer him to the sleep lab for a split night study   HTN -BP poorly controlled on exam today -check 48 hour BP monitor to assess adequacy of BP control -Continue prescription drug management with Lopressor 25mg  BID, Lisinopril 5mg  daily with PRN refills   Time Spent: 25 minutes total time of encounter, including 15 minutes spent in face-to-face patient care on the date of this encounter. This time includes coordination of care and counseling regarding above mentioned problem list. Remainder of non-face-to-face time involved reviewing chart documents/testing relevant to the patient encounter and documentation in the medical record. I have independently reviewed documentation from referring provider  Medication Adjustments/Labs and Tests Ordered: Current medicines are reviewed at  length with the patient today.  Concerns regarding medicines are outlined above.  No orders of the defined types were placed in this encounter.  No orders of the defined types were placed in this encounter.   Signed, Fransico Him, MD  02/08/2021 11:35 AM    [place]

## 2021-02-08 NOTE — Telephone Encounter (Signed)
-----   Message from Antonieta Iba, RN sent at 02/08/2021 11:51 AM EDT ----- Split night sleep study has been ordered.  Thanks!

## 2021-02-08 NOTE — Patient Instructions (Signed)
Medication Instructions:  Your physician recommends that you continue on your current medications as directed. Please refer to the Current Medication list given to you today.  *If you need a refill on your cardiac medications before your next appointment, please call your pharmacy*   Testing/Procedures: Your physician has recommended that you have a sleep study. This test records several body functions during sleep, including: brain activity, eye movement, oxygen and carbon dioxide blood levels, heart rate and rhythm, breathing rate and rhythm, the flow of air through your mouth and nose, snoring, body muscle movements, and chest and belly movement.  Your physician has recommended that you wear a blood pressure monitor.    Follow-Up: At [facility], you and your health needs are our priority.  As part of our continuing mission to provide you with exceptional heart care, we have created designated Provider Care Teams.  These Care Teams include your primary Cardiologist (physician) and Advanced Practice Providers (APPs -  Physician Assistants and Nurse Practitioners) who all work together to provide you with the care you need, when you need it.  Follow up with Dr. Radford Pax based on the results of your sleep study.

## 2021-02-08 NOTE — Addendum Note (Signed)
Addended by: Antonieta Iba on: 02/08/2021 11:40 AM   Modules accepted: Orders

## 2021-02-14 NOTE — Telephone Encounter (Signed)
Patient is scheduled for lab study on 02/22/21. Patient understands his sleep study will be done at [facility]. Patient understands he will receive a sleep packet in a week or so. Patient understands to call if he does not receive the sleep packet in a timely manner.  Left detailed message on voicemail with date and time of titration and informed patient to call back to confirm or reschedule.

## 2021-02-22 ENCOUNTER — Other Ambulatory Visit: Payer: Self-pay

## 2021-02-22 ENCOUNTER — Telehealth: Payer: Self-pay

## 2021-02-22 ENCOUNTER — Ambulatory Visit: Payer: 59 | Attending: Cardiology | Admitting: Cardiology

## 2021-02-22 DIAGNOSIS — G4733 Obstructive sleep apnea (adult) (pediatric): Secondary | ICD-10-CM

## 2021-02-22 DIAGNOSIS — Z9989 Dependence on other enabling machines and devices: Secondary | ICD-10-CM

## 2021-02-22 DIAGNOSIS — R0902 Hypoxemia: Secondary | ICD-10-CM | POA: Diagnosis not present

## 2021-02-22 DIAGNOSIS — I10 Essential (primary) hypertension: Secondary | ICD-10-CM

## 2021-02-22 MED ORDER — LISINOPRIL 10 MG PO TABS: 10.0000 mg | ORAL_TABLET | Freq: Every day | ORAL | 3 refills | Status: DC

## 2021-02-22 NOTE — Telephone Encounter (Signed)
-----   Message from Sueanne Margarita, MD sent at 02/14/2021  4:40 PM EDT ----- Increase Lisinopril to '10mg'$  daily and check BP daily for a week and call.  Check BMET in 1 week ----- Message ----- From: Antonieta Iba, RN Sent: 02/14/2021   9:40 AM EDT To: Sueanne Margarita, MD  Spoke with patient's wife (on DPR). Who states that patient has been taking both lisinopril 5 mg daily and Lopressor 25 mg twice daily as prescribed.

## 2021-02-22 NOTE — Telephone Encounter (Signed)
Spoke with the patient's wife and advised her on recommendations from Dr. Radford Pax. She verbalized understanding. Rx has been sent in. Labs have been ordered.

## 2021-02-23 NOTE — Procedures (Signed)
   Patient Name: Levi Meyers, Levi Meyers Date:02/22/2021 Gender: Male D.O.B: 18-Dec-1965 Age (years): 37 Referring Provider: Fransico Him MD, ABSM Height (inches): 65 Interpreting Physician: Fransico Him MD, ABSM Weight (lbs): 202 RPSGT: Peak, Robert BMI: 34 MRN: [id number] Neck Size: 15.50  CLINICAL INFORMATION Sleep Study Type: NPSG  Indication for sleep study: N/A  Epworth Sleepiness Score: 8  SLEEP STUDY TECHNIQUE As per the AASM Manual for the Scoring of Sleep and Associated Events v2.3 (April 2016) with a hypopnea requiring 4% desaturations.  The channels recorded and monitored were frontal, central and occipital EEG, electrooculogram (EOG), submentalis EMG (chin), nasal and oral airflow, thoracic and abdominal wall motion, anterior tibialis EMG, snore microphone, electrocardiogram, and pulse oximetry.  MEDICATIONS Medications self-administered by patient taken the night of the study : N/A  SLEEP ARCHITECTURE The study was initiated at 8:54:24 PM and ended at 5:09:43 AM.  Sleep onset time was 23.1 minutes and the sleep efficiency was 83.4%. The total sleep time was 413.2 minutes.  Stage REM latency was 101.0 minutes.  The patient spent 4.24% of the night in stage N1 sleep, 80.84% in stage N2 sleep, 0.73% in stage N3 and 14.2% in REM.  Alpha intrusion was absent.  Supine sleep was 30.55%.  RESPIRATORY PARAMETERS The overall apnea/hypopnea index (AHI) was 8.1 per hour. There were 0 total apneas, including 0 obstructive, 0 central and 0 mixed apneas. There were 56 hypopneas and 2 RERAs.  The AHI during Stage REM sleep was 17.4 per hour.  AHI while supine was 12.4 per hour.  The mean oxygen saturation was 89.70%. The minimum SpO2 during sleep was 81.00%.  moderate snoring was noted during this study.  CARDIAC DATA The 2 lead EKG demonstrated sinus rhythm. The mean heart rate was 81.41 beats per minute. Other EKG findings include: None. LEG MOVEMENT DATA The total  PLMS were 59 with a resulting PLMS index of 8.57. Associated arousal with leg movement index was 0.3 .  IMPRESSIONS - Mild obstructive sleep apnea occurred during this study (AHI = 8.1/h). - Mild oxygen desaturation was noted during this study (Min O2 = 81.00%). - The patient snored with moderate snoring volume. - No cardiac abnormalities were noted during this study. - Mild periodic limb movements of sleep occurred during the study. No significant associated arousals.  DIAGNOSIS - Obstructive Sleep Apnea (G47.33) - Nocturnal Hypoxemia (G47.36)  RECOMMENDATIONS - Therapeutic CPAP titration to determine optimal pressure required to alleviate sleep disordered breathing. - Avoid alcohol, sedatives and other CNS depressants that may worsen sleep apnea and disrupt normal sleep architecture. - Sleep hygiene should be reviewed to assess factors that may improve sleep quality. - Weight management and regular exercise should be initiated or continued if appropriate.  [Electronically signed] 02/23/2021 09:14 AM  Fransico Him MD, ABSM Diplomate, American Board of Sleep Medicine

## 2021-02-26 ENCOUNTER — Telehealth: Payer: Self-pay | Admitting: *Deleted

## 2021-02-26 DIAGNOSIS — I10 Essential (primary) hypertension: Secondary | ICD-10-CM

## 2021-02-26 DIAGNOSIS — G4733 Obstructive sleep apnea (adult) (pediatric): Secondary | ICD-10-CM

## 2021-02-26 DIAGNOSIS — Z9989 Dependence on other enabling machines and devices: Secondary | ICD-10-CM

## 2021-02-26 NOTE — Telephone Encounter (Signed)
Patient understands his sleep study showed they have sleep apnea and recommend CPAP titration. Please set up titration in the sleep lab ASAP. Pt is aware of her/his results   Titration to be precerted

## 2021-02-26 NOTE — Telephone Encounter (Signed)
-----   Message from Sueanne Margarita, MD sent at 02/23/2021  9:20 AM EDT ----- Please let patient know that they have sleep apnea.  Recommend therapeutic CPAP titration for treatment of patient's sleep disordered breathing.  If unable to perform an in lab titration then initiate ResMed auto CPAP from 4 to 15cm H2O with heated humidity and mask of choice and overnight pulse ox on CPAP.

## 2021-03-02 ENCOUNTER — Other Ambulatory Visit: Payer: 59 | Admitting: *Deleted

## 2021-03-02 ENCOUNTER — Other Ambulatory Visit: Payer: Self-pay

## 2021-03-02 DIAGNOSIS — I10 Essential (primary) hypertension: Secondary | ICD-10-CM

## 2021-03-02 LAB — BASIC METABOLIC PANEL
BUN/Creatinine Ratio: 13 (ref 9–20)
BUN: 13 mg/dL (ref 6–24)
CO2: 23 mmol/L (ref 20–29)
Calcium: 9.4 mg/dL (ref 8.7–10.2)
Chloride: 97 mmol/L (ref 96–106)
Creatinine, Ser: 1 mg/dL (ref 0.76–1.27)
Glucose: 197 mg/dL — ABNORMAL HIGH (ref 65–99)
Potassium: 3.8 mmol/L (ref 3.5–5.2)
Sodium: 135 mmol/L (ref 134–144)
eGFR: 89 mL/min/{1.73_m2} (ref >59)

## 2021-03-06 NOTE — Telephone Encounter (Signed)
Prior Authorization for TITRATION sent to [facility] via web portal. REP YSA J. CALL REF Number [id number] REF# [id number].

## 2021-03-13 NOTE — Telephone Encounter (Addendum)
Ok to schedule TITRATION valid dates are 03/20/21 to 06/18/21  aut [id number].

## 2021-03-22 NOTE — Telephone Encounter (Signed)
Patient is scheduled for lab study on 04/05/21. Patient understands his sleep study will be done at [place] lab. Pt is aware and agreeable to testing.

## 2021-04-05 ENCOUNTER — Other Ambulatory Visit: Payer: Self-pay

## 2021-04-05 ENCOUNTER — Ambulatory Visit: Payer: 59 | Attending: Cardiology | Admitting: Cardiology

## 2021-04-05 DIAGNOSIS — G4733 Obstructive sleep apnea (adult) (pediatric): Secondary | ICD-10-CM | POA: Diagnosis not present

## 2021-04-05 DIAGNOSIS — I10 Essential (primary) hypertension: Secondary | ICD-10-CM | POA: Insufficient documentation

## 2021-04-05 DIAGNOSIS — R0902 Hypoxemia: Secondary | ICD-10-CM | POA: Insufficient documentation

## 2021-04-05 DIAGNOSIS — Z9989 Dependence on other enabling machines and devices: Secondary | ICD-10-CM | POA: Diagnosis not present

## 2021-04-15 NOTE — Procedures (Signed)
   Patient Name: Levi Meyers, Levi Meyers Date: 04/05/2021 Gender: Male D.O.B: 1965-11-23 Age (years): 42 Referring Provider: Fransico Him MD, ABSM Height (inches): 65 Interpreting Physician: Fransico Him MD, ABSM Weight (lbs): 202 RPSGT: Peak, Robert BMI: 34 MRN: [id number] Neck Size: 15.50  CLINICAL INFORMATION The patient is referred for a CPAP titration to treat sleep apnea.  SLEEP STUDY TECHNIQUE As per the AASM Manual for the Scoring of Sleep and Associated Events v2.3 (April 2016) with a hypopnea requiring 4% desaturations.  The channels recorded and monitored were frontal, central and occipital EEG, electrooculogram (EOG), submentalis EMG (chin), nasal and oral airflow, thoracic and abdominal wall motion, anterior tibialis EMG, snore microphone, electrocardiogram, and pulse oximetry. Continuous positive airway pressure (CPAP) was initiated at the beginning of the study and titrated to treat sleep-disordered breathing.  MEDICATIONS Medications self-administered by patient taken the night of the study : N/A  TECHNICIAN COMMENTS Comments added by technician: Patient was fully adherent to CPAP pressure. CPAP pressure eliminated airway obstructions and snoring. Comments added by scorer: N/A  RESPIRATORY PARAMETERS Optimal PAP Pressure (cm): 10  AHI at Optimal Pressure (/hr):N/A Overall Minimal O2 (%):75.00  Supine % at Optimal Pressure (%):N/A Minimal O2 at Optimal Pressure (%): 75.00   SLEEP ARCHITECTURE The study was initiated at 9:50:45 PM and ended at 4:16:46 AM.  Sleep onset time was 0.0 minutes and the sleep efficiency was 97.2%. The total sleep time was 375 minutes.  The patient spent 1.33% of the night in stage N1 sleep, 82.00% in stage N2 sleep, 0.53% in stage N3 and 16.1% in REM.Stage REM latency was 253.7 minutes  Wake after sleep onset was 11.0. Alpha intrusion was absent. Supine sleep was 33.63%.  CARDIAC DATA The 2 lead EKG demonstrated sinus rhythm. The mean  heart rate was 75.66 beats per minute. Other EKG findings include: None.  LEG MOVEMENT DATA The total Periodic Limb Movements of Sleep (PLMS) were 0. The PLMS index was 0.00. A PLMS index of <15 is considered normal in adults.  IMPRESSIONS - The optimal PAP pressure could not be obtained due to ongoing respiratory events.  - Severe oxygen desaturations were observed during this titration (min O2 = 75.00%). - No snoring was audible during this study. - No cardiac abnormalities were observed during this study. - Clinically significant periodic limb movements were not noted during this study. Arousals associated with PLMs were rare.  DIAGNOSIS - Obstructive Sleep Apnea (G47.33) - Nocturnal Hypoxemia (G47.36)  RECOMMENDATIONS - Due to ongoing respiratory events with CPAP, recommend in lab BiPAP titration.  - Avoid alcohol, sedatives and other CNS depressants that may worsen sleep apnea and disrupt normal sleep architecture. - Sleep hygiene should be reviewed to assess factors that may improve sleep quality. - Weight management and regular exercise should be initiated or continued.  [Electronically signed] 04/15/2021 10:29 PM  Fransico Him MD, ABSM Diplomate, American Board of Sleep Medicine

## 2021-04-18 ENCOUNTER — Telehealth: Payer: Self-pay | Admitting: *Deleted

## 2021-04-18 DIAGNOSIS — Z9989 Dependence on other enabling machines and devices: Secondary | ICD-10-CM

## 2021-04-18 DIAGNOSIS — G4733 Obstructive sleep apnea (adult) (pediatric): Secondary | ICD-10-CM

## 2021-04-18 NOTE — Telephone Encounter (Signed)
-----   Message from Sueanne Margarita, MD sent at 04/15/2021 10:31 PM EDT ----- Unsuccessful CPAP titration - please set up in lab BIPAP titration

## 2021-04-18 NOTE — Telephone Encounter (Signed)
The patient has been notified of the result and verbalized understanding.  All questions (if any) were answered. Marolyn Hammock, [place] 04/18/2021 1:91 PM    Will precert Bipap titration

## 2021-04-22 ENCOUNTER — Encounter (HOSPITAL_BASED_OUTPATIENT_CLINIC_OR_DEPARTMENT_OTHER): Payer: 59 | Admitting: Cardiology

## 2021-05-01 NOTE — Telephone Encounter (Signed)
Prior Authorization for BIPAP TIT sent to [facility] via Phone. Reference # [id number]  APPROVED - 03/20/21 -06-18-21 [id number].

## 2021-05-02 NOTE — Telephone Encounter (Addendum)
Patient is scheduled for BiPAP Titration on 05/11/21. Patient understands his titration study will be done at [place] lab. Patient understands he will receive a letter in a week or so detailing appointment, date, time, and location. Patient understands to call if he does not receive the letter  in a timely manner. Patient agrees with treatment and thanked me for call.

## 2021-05-03 NOTE — Addendum Note (Signed)
Addended by: Freada Bergeron on: 05/03/2021 10:21 AM   Modules accepted: Orders

## 2021-05-08 ENCOUNTER — Emergency Department (HOSPITAL_BASED_OUTPATIENT_CLINIC_OR_DEPARTMENT_OTHER)
Admission: EM | Admit: 2021-05-08 | Discharge: 2021-05-08 | Disposition: A | Discharge status: Home / Self Care | Payer: 59 | Attending: Emergency Medicine | Admitting: Emergency Medicine

## 2021-05-08 ENCOUNTER — Other Ambulatory Visit: Payer: Self-pay

## 2021-05-08 ENCOUNTER — Emergency Department (HOSPITAL_BASED_OUTPATIENT_CLINIC_OR_DEPARTMENT_OTHER): Payer: 59

## 2021-05-08 ENCOUNTER — Encounter (HOSPITAL_BASED_OUTPATIENT_CLINIC_OR_DEPARTMENT_OTHER): Payer: Self-pay

## 2021-05-08 DIAGNOSIS — Z87891 Personal history of nicotine dependence: Secondary | ICD-10-CM | POA: Insufficient documentation

## 2021-05-08 DIAGNOSIS — J069 Acute upper respiratory infection, unspecified: Secondary | ICD-10-CM | POA: Insufficient documentation

## 2021-05-08 DIAGNOSIS — Z20822 Contact with and (suspected) exposure to covid-19: Secondary | ICD-10-CM | POA: Insufficient documentation

## 2021-05-08 DIAGNOSIS — J45909 Unspecified asthma, uncomplicated: Secondary | ICD-10-CM | POA: Diagnosis not present

## 2021-05-08 DIAGNOSIS — Z794 Long term (current) use of insulin: Secondary | ICD-10-CM | POA: Diagnosis not present

## 2021-05-08 DIAGNOSIS — E119 Type 2 diabetes mellitus without complications: Secondary | ICD-10-CM | POA: Diagnosis not present

## 2021-05-08 DIAGNOSIS — Z7982 Long term (current) use of aspirin: Secondary | ICD-10-CM | POA: Diagnosis not present

## 2021-05-08 DIAGNOSIS — I10 Essential (primary) hypertension: Secondary | ICD-10-CM | POA: Insufficient documentation

## 2021-05-08 DIAGNOSIS — Z79899 Other long term (current) drug therapy: Secondary | ICD-10-CM | POA: Insufficient documentation

## 2021-05-08 DIAGNOSIS — R059 Cough, unspecified: Secondary | ICD-10-CM | POA: Diagnosis present

## 2021-05-08 LAB — RESP PANEL BY RT-PCR (FLU A&B, COVID) ARPGX2
Influenza A by PCR: NEGATIVE
Influenza B by PCR: NEGATIVE
SARS Coronavirus 2 by RT PCR: NEGATIVE

## 2021-05-08 MED ORDER — DEXAMETHASONE 4 MG PO TABS
4.0000 mg | ORAL_TABLET | Freq: Once | ORAL | Status: AC
  Administered 2021-05-08: 4 mg via ORAL
  Filled 2021-05-08: qty 1

## 2021-05-08 MED ORDER — ONDANSETRON 4 MG PO TBDP
4.0000 mg | ORAL_TABLET | Freq: Once | ORAL | Status: AC
  Administered 2021-05-08: 4 mg via ORAL
  Filled 2021-05-08: qty 1

## 2021-05-08 NOTE — ED Triage Notes (Signed)
Patient here POV from Home with Cough.  Patient states he being feeling ill yesterday AM. Patient has a Dry Cough that has been persistent. No Known Sick Contacts. Moderate Nausea and Vomiting.  Patient Anxious during Triage. A&Ox4. GCS 15. Ambulatory. No Fevers.

## 2021-05-08 NOTE — Discharge Instructions (Addendum)
Chest x-ray shows no evidence of pneumonia.  Overall suspect that this is a viral process.  I have given you a long-acting steroid to help with your cough and congestion.  Follow-up your viral panel on your MyChart.

## 2021-05-08 NOTE — ED Notes (Signed)
Patient verbalizes understanding of discharge instructions. Opportunity for questioning and answers were provided. Patient discharged from ED.  °

## 2021-05-08 NOTE — ED Provider Notes (Signed)
[facility] Provider Note   CSN: [id number] Arrival date & time: 05/08/21  2263     History Chief Complaint  Patient presents with   Cough    Levi Meyers is a 55 y.o. male.  The history is provided by the patient.  Cough Cough characteristics:  Productive Sputum characteristics:  Nondescript Severity:  Mild Onset quality:  Gradual Timing:  Constant Progression:  Unchanged Chronicity:  New Context: sick contacts and upper respiratory infection   Relieved by:  Nothing Worsened by:  Nothing Associated symptoms: rhinorrhea and sinus congestion   Associated symptoms: no chest pain, no chills, no diaphoresis, no ear fullness, no ear pain, no eye discharge, no fever, no headaches, no myalgias, no shortness of breath and no sore throat       Past Medical History:  Diagnosis Date   Asthma    Depression    Diabetes mellitus    Diabetes mellitus without complication ([place])    GERD (gastroesophageal reflux disease)    High cholesterol    History of kidney stones    Hyperlipidemia    Hypertension    Kidney disease    stones   Kidney stone    Noncompliance with diabetes treatment    Sleep apnea    cpap    Patient Active Problem List   Diagnosis Date Noted   Chest pain 07/03/2020   Unstable angina (HCC)    Type 2 diabetes mellitus without complication, with long-term current use of insulin (HCC)    Cervical spondylosis with radiculopathy 06/24/2014   OSA on CPAP 02/18/2013   DOE (dyspnea on exertion) 01/07/2013   Tachycardia 01/07/2013   Edema extremities 01/07/2013   Bipolar affective disorder ([place]) 03/31/2012   Type 1 diabetes mellitus with hypoglycemia ([place]) 11/22/2011   Amblyopia 11/22/2011   Bronchitis, acute 11/03/2011   DIABETES MELLITUS, TYPE I 04/28/2008   HYPERLIPIDEMIA 04/28/2008   RHEUMATIC FEVER 04/28/2008   Primary hypertension 04/28/2008   RENAL CALCULUS, HX OF 04/28/2008   CHICKENPOX, HX OF 04/28/2008    Past  Surgical History:  Procedure Laterality Date   ANTERIOR CERVICAL DECOMP/DISCECTOMY FUSION N/A 06/24/2014   Procedure: Cervical five-six anterior cervical decompression with fusion interbody prosthesis with plating and bonegraft;  Surgeon: Consuella Lose, MD;  Location: [facility];  Service: Neurosurgery;  Laterality: N/A;  Cervical five-six anterior cervical decompression with fusion interbody prosthesis with plating and bonegraft   APPENDECTOMY  2004   APPENDECTOMY     CARDIAC CATHETERIZATION     Dr. Verl Blalock ([place])   CERVICAL SPINE SURGERY     CHOLECYSTECTOMY  2004   CHOLECYSTECTOMY     COLONOSCOPY N/A 09/08/2012   Procedure: COLONOSCOPY;  Surgeon: Leighton Ruff, MD;  Location: [facility];  Service: Endoscopy;  Laterality: N/A;   EYE SURGERY  1970   EYE SURGERY     HERNIA REPAIR     KNEE CARTILAGE SURGERY     LEFT HEART CATH AND CORONARY ANGIOGRAPHY N/A 07/03/2020   Procedure: LEFT HEART CATH AND CORONARY ANGIOGRAPHY;  Surgeon: Sherren Mocha, MD;  Location: [facility];  Service: Cardiovascular;  Laterality: N/A;   LEG SURGERY     SKIN GRAFT  1974   SKIN GRAFT FULL THICKNESS LEG     STRABISMUS SURGERY         Family History  Problem Relation Age of Onset   Diabetes Mother    Heart attack Mother    Stroke Mother    Diabetes Father  Heart disease Other        Parent   Heart disease Other        Grandparents    Social History   Tobacco Use   Smoking status: Former    Years: 4.00    Types: Cigarettes   Smokeless tobacco: Former  Substance Use Topics   Alcohol use: No   Drug use: No    Home Medications Prior to Admission medications   Medication Sig Start Date End Date Taking? Authorizing Provider  aspirin 81 MG tablet Take 81 mg by mouth daily.    [provider]  atorvastatin (LIPITOR) 20 MG tablet Take 20 mg by mouth daily.    [provider]  buPROPion (WELLBUTRIN XL) 300 MG 24 hr tablet Take 300 mg by mouth daily.     [provider]  Continuous Blood Gluc Sensor (FREESTYLE LIBRE 2 SENSOR) MISC 1 each by Other route every 14 (fourteen) days. 12/29/20   [provider]  Continuous Blood Gluc Sensor (FREESTYLE LIBRE 2 SENSOR) MISC 1 each by Does not apply route every 14 (fourteen) days. 12/29/20   [provider]  dicyclomine (BENTYL) 20 MG tablet Take 1 tablet (20 mg total) by mouth 2 (two) times daily as needed for spasms. 12/17/19   Hayden Rasmussen, MD  gabapentin (NEURONTIN) 300 MG capsule Take 600 mg by mouth 3 (three) times daily.    [provider]  ibuprofen (ADVIL,MOTRIN) 200 MG tablet Take 400 mg by mouth 2 (two) times daily as needed (pain).    [provider]  insulin aspart (NOVOLOG) 100 UNIT/ML FlexPen Inject 30 Units into the skin daily before supper.     [provider]  insulin glargine (LANTUS) 100 UNIT/ML Solostar Pen Inject 60 Units into the skin 2 (two) times daily.     [provider]  lisinopril (ZESTRIL) 10 MG tablet Take 1 tablet (10 mg total) by mouth daily. 02/22/21   Sueanne Margarita, MD  metoprolol tartrate (LOPRESSOR) 25 MG tablet Take 1 tablet (25 mg total) by mouth 2 (two) times daily. 07/24/20 01/24/21  Burtis Junes, NP  nitroGLYCERIN (NITROSTAT) 0.4 MG SL tablet Place 1 tablet (0.4 mg total) under the tongue every 5 (five) minutes as needed for chest pain. 07/24/20 01/24/21  Burtis Junes, NP  traZODone (DESYREL) 100 MG tablet Take 100 mg by mouth at bedtime.    [provider]  TRULICITY 3 XL/2.4MW SOPN Inject 3 mg into the skin once a week. Mondays 12/01/20   [provider]    Allergies    Byetta 10 mcg pen [exenatide], Codeine, Invokana [canagliflozin], and Victoza [liraglutide]  Review of Systems   Review of Systems  Constitutional:  Negative for chills, diaphoresis and fever.  HENT:  Positive for congestion, postnasal drip, rhinorrhea and sinus pain. Negative for drooling, ear discharge, ear pain, sore  throat and trouble swallowing.   Eyes:  Negative for discharge.  Respiratory:  Positive for cough. Negative for shortness of breath.   Cardiovascular:  Negative for chest pain.  Gastrointestinal:  Positive for nausea. Negative for abdominal pain.  Musculoskeletal:  Negative for myalgias.  Neurological:  Negative for weakness and headaches.   Physical Exam Updated Vital Signs BP 139/89   Pulse 94   Temp 97.9 F (36.6 C) (Oral)   Resp 20   Ht 5\' 5"  (1.651 m)   Wt 93.1 kg   SpO2 95%   BMI 34.16 kg/m   Physical Exam  Constitutional:      General: He is not in acute distress.    Appearance: He is not ill-appearing.  HENT:     Head: Normocephalic and atraumatic.     Nose: Congestion present.     Mouth/Throat:     Mouth: Mucous membranes are moist.     Pharynx: No oropharyngeal exudate or posterior oropharyngeal erythema.  Eyes:     Pupils: Pupils are equal, round, and reactive to light.  Cardiovascular:     Rate and Rhythm: Normal rate.     Pulses: Normal pulses.  Pulmonary:     Effort: Pulmonary effort is normal.     Breath sounds: No wheezing.  Abdominal:     General: Abdomen is flat.     Tenderness: There is no abdominal tenderness.  Musculoskeletal:     Cervical back: Normal range of motion.  Skin:    Capillary Refill: Capillary refill takes less than 2 seconds.  Neurological:     Mental Status: He is alert.    ED Results / Procedures / Treatments   Labs (all labs ordered are listed, but only abnormal results are displayed) Labs Reviewed  RESP PANEL BY RT-PCR (FLU A&B, COVID) [id number]    EKG EKG Interpretation  Date/Time:  Tuesday May 08 2021 06:45:20 EDT Ventricular Rate:  99 PR Interval:  159 QRS Duration: 91 QT Interval:  333 QTC Calculation: 428 R Axis:   83 Text Interpretation: Sinus rhythm Nonspecific T abnormalities, lateral leads NO SIGNIFICANT CHANGE SINCE LAST TRACING YESTERDAY Confirmed by Lennice Sites [phone]) on 05/08/2021 7:07:50  AM  Radiology DG Chest Portable 1 View  Result Date: 05/08/2021 CLINICAL DATA:  SHORTNESS OF BREATH.  COUGH. EXAM: PORTABLE CHEST - 1 VIEW COMPARISON:  Chest radiograph, most recently 01/10/2021. CT chest, 06/28/2014. FINDINGS: Enlarged cardiac silhouette. Hypoinflation. No focal consolidation or mass. No pleural effusion pneumothorax. Cervical fusion hardware, incompletely imaged. No acute osseous abnormality. IMPRESSION: Cardiomegaly without acute superimposed cardiopulmonary process. Electronically Signed   By: Michaelle Birks M.D.   On: 05/08/2021 07:48    Procedures Procedures   Medications Ordered in ED Medications  dexamethasone (DECADRON) tablet 4 mg (has no administration in time range)  ondansetron (ZOFRAN-ODT) disintegrating tablet 4 mg (4 mg Oral Given 05/08/21 6759)    ED Course  I have reviewed the triage vital signs and the nursing notes.  Pertinent labs & imaging results that were available during my care of the patient were reviewed by me and considered in my medical decision making (see chart for details).    MDM Rules/Calculators/A&P                           Levi Meyers is a 55 year old male with history of diabetes who presents the ED with URI symptoms.  Cough, congestion.  May be exposure to COVID here in the last several weeks.  Normal vitals.  No fever.  Clear breath sounds.  Normal work of breathing.  No chest pain.  No signs of throat infection.  Nasal turbinates are inflamed.  Overall suspect viral process.  Given a dose of Decadron/Zofran for symptomatic care.  Chest x-ray with no obvious pneumonia.  EKG shows sinus rhythm.  No ischemic changes.  Not having any chest pain.  No concern for ACS or PE.  Overall suspect viral process.  Viral panel collected and patient will follow up on his MyChart.  Discharged in good condition.  Understands return precautions.  This chart  was dictated using voice recognition software.  Despite best efforts to proofread,  errors  can occur which can change the documentation meaning.    Final Clinical Impression(s) / ED Diagnoses Final diagnoses:  Viral URI    Rx / DC Orders ED Discharge Orders     None        Lennice Sites, DO 05/08/21 1791

## 2021-05-11 ENCOUNTER — Ambulatory Visit: Payer: 59 | Attending: Cardiology | Admitting: Cardiology

## 2021-05-11 ENCOUNTER — Other Ambulatory Visit: Payer: Self-pay

## 2021-05-11 DIAGNOSIS — G4733 Obstructive sleep apnea (adult) (pediatric): Secondary | ICD-10-CM | POA: Insufficient documentation

## 2021-05-11 DIAGNOSIS — Z9989 Dependence on other enabling machines and devices: Secondary | ICD-10-CM

## 2021-05-11 DIAGNOSIS — G4736 Sleep related hypoventilation in conditions classified elsewhere: Secondary | ICD-10-CM | POA: Insufficient documentation

## 2021-05-14 NOTE — Procedures (Signed)
   Patient Name: Levi Meyers, Levi Meyers Date: 05/11/2021 Gender: Male D.O.B: 09-25-1965 Age (years): 4 Referring Provider: Fransico Him MD, ABSM Height (inches): 65 Interpreting Physician: Fransico Him MD, ABSM Weight (lbs): 202 RPSGT: Peak, Robert BMI: 34 MRN: [id number] Neck Size: 15.50  CLINICAL INFORMATION The patient is referred for a BiPAP titration to treat sleep apnea.  SLEEP STUDY TECHNIQUE As per the AASM Manual for the Scoring of Sleep and Associated Events v2.3 (April 2016) with a hypopnea requiring 4% desaturations.  The channels recorded and monitored were frontal, central and occipital EEG, electrooculogram (EOG), submentalis EMG (chin), nasal and oral airflow, thoracic and abdominal wall motion, anterior tibialis EMG, snore microphone, electrocardiogram, and pulse oximetry. Bilevel positive airway pressure (BPAP) was initiated at the beginning of the study and titrated to treat sleep-disordered breathing.  MEDICATIONS Medications self-administered by patient taken the night of the study : N/A  RESPIRATORY PARAMETERS Optimal IPAP Pressure (cm): 14  AHI at Optimal Pressure (/hr) 0 Optimal EPAP Pressure (cm):10  Overall Minimal O2 (%):77.00  Minimal O2 at Optimal Pressure (%):89.0  SLEEP ARCHITECTURE Start Time:9:53:51 PM  Stop Time:4:57:31 AM  Total Time (min):423.7  Total Sleep Time (min):411.4 Sleep Latency (min):2.7  Sleep Efficiency (%):97.1  REM Latency (min):230.0  WASO (min):9.5 Stage N1 (%)1.82  Stage N2 (%):94.65  Stage N3 (%): 0.49  Stage R (%):3 Supine (%):81.01  Arousal Index (/hr):6.3   CARDIAC DATA The 2 lead EKG demonstrated sinus rhythm. The mean heart rate was 75.47 beats per minute. Other EKG findings include: None.  LEG MOVEMENT DATA The total Periodic Limb Movements of Sleep (PLMS) were 75. The PLMS index was 10.94. A PLMS index of <15 is considered normal in adults.  IMPRESSIONS - An optimal PAP pressure was selected for this  patient ( 14/10 cm of water) - Severe oxygen desaturations were observed during this titration (min O2 = 77.00%). - No snoring was audible during this study. - No cardiac abnormalities were observed during this study. - Mild periodic limb movements were observed during this study. Arousals associated with PLMs were rare.  DIAGNOSIS - Obstructive Sleep Apnea (G47.33) - Nocturnal Hypoxemia  RECOMMENDATIONS - Trial of BiPAP therapy on 14/10 cm H2O with a Small size Fisher&Paykel Nasal Mask Evora (Nasal) Mask mask and heated humidification. - Avoid alcohol, sedatives and other CNS depressants that may worsen sleep apnea and disrupt normal sleep architecture. - Sleep hygiene should be reviewed to assess factors that may improve sleep quality. - Weight management and regular exercise should be initiated or continued. - Return to Sleep Center for re-evaluation after 4 weeks of therapy  [Electronically signed] 05/14/2021 10:49 PM  Fransico Him MD, ABSM Diplomate, American Board of Sleep Medicine

## 2021-05-17 ENCOUNTER — Telehealth: Payer: Self-pay | Admitting: *Deleted

## 2021-05-17 NOTE — Telephone Encounter (Signed)
The patient has been notified of the result and verbalized understanding.  All questions (if any) were answered. Marolyn Hammock, [place] 05/17/2021 1:30 PM    Upon patient request DME selection is [facility] Patient understands he will be contacted by [facility] to set up his cpap. Patient understands to call if [facility] does not contact him with new setup in a timely manner. Patient understands they will be called once confirmation has been received from [facility] that they have received their new machine to schedule 10 week follow up appointment.   [place] notified of new cpap order  Please add to airview Patient was grateful for the call and thanked me

## 2021-05-17 NOTE — Telephone Encounter (Signed)
-----   Message from Sueanne Margarita, MD sent at 05/14/2021 10:52 PM EDT ----- Please let patient know that they had a successful PAP titration and let DME know that orders are in EPIC.  Please set up 6 week OV with me. Get an overnight pulse ox on BiPAP

## 2021-05-24 ENCOUNTER — Other Ambulatory Visit: Payer: Self-pay

## 2021-05-24 MED ORDER — METOPROLOL TARTRATE 25 MG PO TABS: 25.0000 mg | ORAL_TABLET | Freq: Two times a day (BID) | ORAL | 2 refills | Status: AC

## 2021-06-18 ENCOUNTER — Encounter (HOSPITAL_BASED_OUTPATIENT_CLINIC_OR_DEPARTMENT_OTHER): Payer: 59 | Admitting: Cardiology

## 2021-07-26 ENCOUNTER — Telehealth: Payer: Self-pay | Admitting: Cardiology

## 2021-07-26 NOTE — Telephone Encounter (Signed)
Called pt to schedule sleep compliance appt. He stated he would have his wife call us back tomorrow.

## 2021-08-03 NOTE — Telephone Encounter (Signed)
LVM for pt to call back and schedule sleep compliance appt

## 2021-09-05 ENCOUNTER — Other Ambulatory Visit: Payer: Self-pay

## 2021-09-05 ENCOUNTER — Emergency Department (HOSPITAL_BASED_OUTPATIENT_CLINIC_OR_DEPARTMENT_OTHER)
Admission: EM | Admit: 2021-09-05 | Discharge: 2021-09-05 | Disposition: A | Discharge status: Home / Self Care | Payer: 59 | Attending: Emergency Medicine | Admitting: Emergency Medicine

## 2021-09-05 ENCOUNTER — Encounter (HOSPITAL_BASED_OUTPATIENT_CLINIC_OR_DEPARTMENT_OTHER): Payer: Self-pay | Admitting: Emergency Medicine

## 2021-09-05 DIAGNOSIS — R519 Headache, unspecified: Secondary | ICD-10-CM | POA: Diagnosis present

## 2021-09-05 DIAGNOSIS — G43909 Migraine, unspecified, not intractable, without status migrainosus: Secondary | ICD-10-CM | POA: Insufficient documentation

## 2021-09-05 DIAGNOSIS — Z20822 Contact with and (suspected) exposure to covid-19: Secondary | ICD-10-CM | POA: Diagnosis not present

## 2021-09-05 DIAGNOSIS — Z7982 Long term (current) use of aspirin: Secondary | ICD-10-CM | POA: Insufficient documentation

## 2021-09-05 DIAGNOSIS — Z79899 Other long term (current) drug therapy: Secondary | ICD-10-CM | POA: Diagnosis not present

## 2021-09-05 DIAGNOSIS — I10 Essential (primary) hypertension: Secondary | ICD-10-CM | POA: Diagnosis not present

## 2021-09-05 DIAGNOSIS — E119 Type 2 diabetes mellitus without complications: Secondary | ICD-10-CM | POA: Diagnosis not present

## 2021-09-05 DIAGNOSIS — Z794 Long term (current) use of insulin: Secondary | ICD-10-CM | POA: Insufficient documentation

## 2021-09-05 LAB — CBC
HCT: 44.6 % (ref 39.0–52.0)
Hemoglobin: 15.2 g/dL (ref 13.0–17.0)
MCH: 29.9 pg (ref 26.0–34.0)
MCHC: 34.1 g/dL (ref 30.0–36.0)
MCV: 87.8 fL (ref 80.0–100.0)
Platelets: 281 10*3/uL (ref 150–400)
RBC: 5.08 MIL/uL (ref 4.22–5.81)
RDW: 12.8 % (ref 11.5–15.5)
WBC: 7.6 10*3/uL (ref 4.0–10.5)
nRBC: 0 % (ref 0.0–0.2)

## 2021-09-05 LAB — BASIC METABOLIC PANEL
Anion gap: 8 (ref 5–15)
BUN: 9 mg/dL (ref 6–20)
CO2: 23 mmol/L (ref 22–32)
Calcium: 8.9 mg/dL (ref 8.9–10.3)
Chloride: 103 mmol/L (ref 98–111)
Creatinine, Ser: 0.82 mg/dL (ref 0.61–1.24)
GFR, Estimated: >60 mL/min (ref >60)
Glucose, Bld: 262 mg/dL — ABNORMAL HIGH (ref 70–99)
Potassium: 3.5 mmol/L (ref 3.5–5.1)
Sodium: 134 mmol/L — ABNORMAL LOW (ref 135–145)

## 2021-09-05 LAB — RESP PANEL BY RT-PCR (FLU A&B, COVID) ARPGX2
Influenza A by PCR: NEGATIVE
Influenza B by PCR: NEGATIVE
SARS Coronavirus 2 by RT PCR: NEGATIVE

## 2021-09-05 MED ORDER — SODIUM CHLORIDE 0.9 % IV SOLN
INTRAVENOUS | Status: DC
Start: 2021-09-05 — End: 2021-09-05

## 2021-09-05 MED ORDER — METOCLOPRAMIDE HCL 5 MG/ML IJ SOLN
10.0000 mg | Freq: Once | INTRAMUSCULAR | Status: AC
  Administered 2021-09-05: 10 mg via INTRAVENOUS
  Filled 2021-09-05: qty 2

## 2021-09-05 MED ORDER — SODIUM CHLORIDE 0.9 % IV BOLUS
1000.0000 mL | Freq: Once | INTRAVENOUS | Status: AC
  Administered 2021-09-05: 1000 mL via INTRAVENOUS

## 2021-09-05 MED ORDER — DIPHENHYDRAMINE HCL 50 MG/ML IJ SOLN
25.0000 mg | Freq: Once | INTRAMUSCULAR | Status: AC
  Administered 2021-09-05: 25 mg via INTRAVENOUS
  Filled 2021-09-05: qty 1

## 2021-09-05 MED ORDER — KETOROLAC TROMETHAMINE 15 MG/ML IJ SOLN
15.0000 mg | Freq: Once | INTRAMUSCULAR | Status: AC
  Administered 2021-09-05: 15 mg via INTRAVENOUS
  Filled 2021-09-05: qty 1

## 2021-09-05 MED ORDER — ACETAMINOPHEN 500 MG PO TABS
1000.0000 mg | ORAL_TABLET | Freq: Once | ORAL | Status: AC
  Administered 2021-09-05: 1000 mg via ORAL
  Filled 2021-09-05: qty 2

## 2021-09-05 NOTE — ED Provider Notes (Signed)
[facility] EMERGENCY DEPARTMENT Provider Note   CSN: [id number] Arrival date & time: 09/05/21  9767     History  Chief Complaint  Patient presents with   Headache    Levi Meyers is a 56 y.o. male.   Headache Associated symptoms: nausea    55 year old male presenting to the emergency department with a headache.  The patient states that since yesterday morning, he has had a gradual onset of headache which radiates in a bandlike pattern across his forehead.  He denies any sudden onset or maximal onset of his headache.  He denies any fevers, chills or neck stiffness.  He denies any visual deficits.  He does endorse nausea and light sensitivity, no sound sensitivity.  He has had no episodes of vomiting but feels like he might vomit.  He denies any neurologic deficits.  He denies any visual field deficit or diplopia.  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  He denies any facial droop, numbness, weakness, dysarthria, dysphagia.  Home Medications Prior to Admission medications   Medication Sig Start Date End Date Taking? Authorizing Provider  aspirin 81 MG tablet Take 81 mg by mouth daily.    [provider]  atorvastatin (LIPITOR) 20 MG tablet Take 20 mg by mouth daily.    [provider]  buPROPion (WELLBUTRIN XL) 300 MG 24 hr tablet Take 300 mg by mouth daily.     [provider]  Continuous Blood Gluc Sensor (FREESTYLE LIBRE 2 SENSOR) MISC 1 each by Other route every 14 (fourteen) days. 12/29/20   [provider]  Continuous Blood Gluc Sensor (FREESTYLE LIBRE 2 SENSOR) MISC 1 each by Does not apply route every 14 (fourteen) days. 12/29/20   [provider]  dicyclomine (BENTYL) 20 MG tablet Take 1 tablet (20 mg total) by mouth 2 (two) times daily as needed for spasms. 12/17/19   Hayden Rasmussen, MD  gabapentin (NEURONTIN) 300 MG capsule Take 600 mg by mouth 3 (three) times daily.    [provider]  ibuprofen (ADVIL,MOTRIN) 200 MG tablet Take 400 mg by mouth 2 (two) times daily as needed (pain).    [provider]  insulin aspart (NOVOLOG) 100 UNIT/ML FlexPen Inject 30 Units into the skin daily before supper.     [provider]  insulin glargine (LANTUS) 100 UNIT/ML Solostar Pen Inject 60 Units into the skin 2 (two) times daily.     [provider]  lisinopril (ZESTRIL) 10 MG tablet Take 1 tablet (10 mg total) by mouth daily. 02/22/21   Sueanne Margarita, MD  metoprolol tartrate (LOPRESSOR) 25 MG tablet Take 1 tablet (25 mg total) by mouth 2 (two) times daily. 05/24/21 08/22/21  Sueanne Margarita, MD  nitroGLYCERIN (NITROSTAT) 0.4 MG SL tablet Place 1 tablet (0.4 mg total) under the tongue every 5 (five) minutes as needed for chest pain. 07/24/20 01/24/21  Burtis Junes, NP  traZODone (DESYREL) 100 MG tablet Take 100 mg by mouth at bedtime.    [provider]  TRULICITY 3 HA/1.9FX SOPN Inject 3 mg into the skin once a week. Mondays 12/01/20   [provider]      Allergies    Byetta 10 mcg pen [exenatide], Codeine, Invokana [canagliflozin], and Victoza [liraglutide]    Review of Systems   Review of Systems  Gastrointestinal:  Positive for nausea.  Neurological:  Positive for headaches.  All other systems reviewed and are  negative.  Physical Exam Updated Vital Signs BP 117/68 (BP Location: Left Arm)    Pulse 96    Temp 98 F (36.7 C) (Oral)    Resp 16    SpO2 98%  Physical Exam Vitals and nursing note reviewed.  Constitutional:      General: He is not in acute distress.    Appearance: He is well-developed.  HENT:     Head: Normocephalic and atraumatic.  Eyes:     Conjunctiva/sclera: Conjunctivae normal.     Pupils: Pupils are equal, round, and reactive to light.  Cardiovascular:     Rate and Rhythm: Normal rate and regular rhythm.     Heart sounds: No murmur heard. Pulmonary:     Effort: Pulmonary effort is normal. No respiratory  distress.     Breath sounds: Normal breath sounds.  Abdominal:     General: There is no distension.     Palpations: Abdomen is soft.     Tenderness: There is no abdominal tenderness. There is no guarding.  Musculoskeletal:        General: No swelling, deformity or signs of injury.     Cervical back: Neck supple.  Skin:    General: Skin is warm and dry.     Capillary Refill: Capillary refill takes less than 2 seconds.     Findings: No lesion or rash.  Neurological:     General: No focal deficit present.     Mental Status: He is alert. Mental status is at baseline.     Comments: MENTAL STATUS EXAM:    Orientation: Alert and oriented to person, place and time.  Memory: Cooperative, follows commands well.  Language: Speech is clear and language is normal.   CRANIAL NERVES:    CN 2 (Optic): Visual fields intact to confrontation.  CN 3,4,6 (EOM): Pupils equal and reactive to light. Full extraocular eye movement without nystagmus.  Strabismus present. CN 5 (Trigeminal): Facial sensation is normal, no weakness of masticatory muscles.  CN 7 (Facial): No facial weakness or asymmetry.  CN 8 (Auditory): Auditory acuity grossly normal.  CN 9,10 (Glossophar): The uvula is midline, the palate elevates symmetrically.  CN 11 (spinal access): Normal sternocleidomastoid and trapezius strength.  CN 12 (Hypoglossal): The tongue is midline. No atrophy or fasciculations.Marland Kitchen   MOTOR:  Muscle Strength: 5/5RUE, 5/5LUE, 5/5RLE, 5/5LLE.   SENSATION:   Intact to light touch all four extremities, slightly decreased in the RLE in area of old skin graft.  GAIT: Gait normal without ataxia   Psychiatric:        Mood and Affect: Mood normal.    ED Results / Procedures / Treatments   Labs (all labs ordered are listed, but only abnormal results are displayed) Labs Reviewed  BASIC METABOLIC PANEL - Abnormal; Notable for the following components:      Result Value   Sodium 134 (*)    Glucose, Bld 262 (*)     All other components within normal limits  RESP PANEL BY RT-PCR (FLU A&B, COVID) ARPGX2  CBC    EKG None  Radiology No results found.  Procedures Procedures    Medications Ordered in ED Medications  sodium chloride 0.9 % bolus 1,000 mL (0 mLs Intravenous Stopped 09/05/21 0843)    And  0.9 %  sodium chloride infusion (has no administration in time range)  ketorolac (TORADOL) 15 MG/ML injection 15 mg (15 mg Intravenous Given 09/05/21 0742)  metoCLOPramide (REGLAN) injection 10 mg (10 mg Intravenous Given 09/05/21 0742)  diphenhydrAMINE (BENADRYL) injection 25 mg (25 mg Intravenous Given 09/05/21 0742)  acetaminophen (TYLENOL) tablet 1,000 mg (1,000 mg Oral Given 09/05/21 8242)    ED Course/ Medical Decision Making/ A&P                           Medical Decision Making Amount and/or Complexity of Data Reviewed Labs: ordered.  Risk OTC drugs. Prescription drug management.    56 year old male presenting to the emergency department with a headache.  The patient states that since yesterday morning, he has had a gradual onset of headache which radiates in a bandlike pattern across his forehead.  He denies any sudden onset or maximal onset of his headache.  He denies any fevers, chills or neck stiffness.  He denies any visual deficits.  He does endorse nausea and light sensitivity, no sound sensitivity.  He has had no episodes of vomiting but feels like he might vomit.  He denies any neurologic deficits.  He denies any visual field deficit or diplopia.  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  He denies any facial droop, numbness, weakness, dysarthria, dysphagia.  On arrival, the patient was vitally stable.  Afebrile, hemodynamically stable, not tachycardic or tachypneic, mildly hypertensive BP 159/99, saturating 97% on room air.  Sinus rhythm noted on cardiac telemetry.  I have reviewed the nursing documentation for past medical history, family history, and  social history. I have reviewed the EMR and have learned that the patient has a history of DM 2, GERD, HLD, nephrolithiasis, HTN, OSA on CPAP.  Currently he is awake, alert, GCS 15, HDS, and afebrile. His exam is most notable for normal gait, fully intact extraocular motions with bilaterally reactive pupils, no focal neurologic deficits, no meningismus, and no temporal tenderness. There is no rash. The headache was not sudden onset or the worst headache of the patient's life. There is no visual deficit.  I am most concerned for tension type headache.  To further evaluate and risk stratify, labs were obtained, which were significant for:  Labs:   I do not think the patient has an aneurysm, intracranial bleed, mass lesion, meningitis, temporal arteritis, stroke, cluster headache, idiopathic intracranial hypertension, cavernous sinus thrombosis, carbon monoxide toxicity, herpes zoster, carotid or vertebral artery dissection, or acute angle close glaucoma.  On reassessment, the patient remained well appearing and was again able to ambulate without difficulty and did not have any focal neurologic deficits.  I believe the patient is stable for discharge.  We participated in shared decision making regarding continued observation in the ED versus discharge home for continued recovery after receiving the below medications. He preferred to recover at home. I believe that this is safe and reasonable.  We have discussed the diagnosis and risks, and we agree with discharging home to follow-up with their primary doctor. We also discussed returning to the Emergency Department immediately if new or worsening symptoms occur. We have discussed the symptoms which are most concerning (e.g., changing or worsening pain, weakness, vomiting, fever, or abnormal sensation) that necessitate immediate return. I provided ED return precautions. The patient felt safe with this plan.  ED Medication Summary: Medications  sodium  chloride 0.9 % bolus 1,000 mL (0 mLs Intravenous Stopped 09/05/21 0843)    And  0.9 %  sodium chloride infusion (has no administration in time range)  ketorolac (TORADOL) 15 MG/ML injection 15 mg (15 mg Intravenous Given 09/05/21 0742)  metoCLOPramide (REGLAN) injection 10 mg (10 mg Intravenous Given 09/05/21 0742)  diphenhydrAMINE (BENADRYL) injection 25 mg (25 mg Intravenous Given 09/05/21 0742)  acetaminophen (TYLENOL) tablet 1,000 mg (1,000 mg Oral Given 09/05/21 0742)     Final Clinical Impression(s) / ED Diagnoses Final diagnoses:  Migraine without status migrainosus, not intractable, unspecified migraine type    Rx / DC Orders ED Discharge Orders     None         Regan Lemming, MD 09/05/21 [phone]

## 2021-09-05 NOTE — ED Triage Notes (Signed)
Pt reports HA across forehead and nausea since yesterday. No v/d, cough, sore throat or runny nose.

## 2021-09-18 ENCOUNTER — Telehealth: Payer: 59 | Admitting: Cardiology

## 2021-10-08 ENCOUNTER — Encounter (HOSPITAL_BASED_OUTPATIENT_CLINIC_OR_DEPARTMENT_OTHER): Payer: Self-pay | Admitting: *Deleted

## 2021-10-08 ENCOUNTER — Emergency Department (HOSPITAL_BASED_OUTPATIENT_CLINIC_OR_DEPARTMENT_OTHER)
Admission: EM | Admit: 2021-10-08 | Discharge: 2021-10-08 | Disposition: A | Discharge status: Home / Self Care | Payer: 59 | Attending: Emergency Medicine | Admitting: Emergency Medicine

## 2021-10-08 ENCOUNTER — Other Ambulatory Visit: Payer: Self-pay

## 2021-10-08 ENCOUNTER — Emergency Department (HOSPITAL_BASED_OUTPATIENT_CLINIC_OR_DEPARTMENT_OTHER): Payer: 59

## 2021-10-08 DIAGNOSIS — R112 Nausea with vomiting, unspecified: Secondary | ICD-10-CM | POA: Insufficient documentation

## 2021-10-08 DIAGNOSIS — I10 Essential (primary) hypertension: Secondary | ICD-10-CM | POA: Insufficient documentation

## 2021-10-08 DIAGNOSIS — Z7982 Long term (current) use of aspirin: Secondary | ICD-10-CM | POA: Diagnosis not present

## 2021-10-08 DIAGNOSIS — R109 Unspecified abdominal pain: Secondary | ICD-10-CM | POA: Diagnosis present

## 2021-10-08 DIAGNOSIS — E1165 Type 2 diabetes mellitus with hyperglycemia: Secondary | ICD-10-CM | POA: Insufficient documentation

## 2021-10-08 DIAGNOSIS — Z79899 Other long term (current) drug therapy: Secondary | ICD-10-CM | POA: Diagnosis not present

## 2021-10-08 DIAGNOSIS — Z794 Long term (current) use of insulin: Secondary | ICD-10-CM | POA: Insufficient documentation

## 2021-10-08 DIAGNOSIS — D72829 Elevated white blood cell count, unspecified: Secondary | ICD-10-CM | POA: Diagnosis not present

## 2021-10-08 LAB — CBC WITH DIFFERENTIAL/PLATELET
Abs Immature Granulocytes: 0.07 10*3/uL (ref 0.00–0.07)
Basophils Absolute: 0.1 10*3/uL (ref 0.0–0.1)
Basophils Relative: 0 %
Eosinophils Absolute: 0.2 10*3/uL (ref 0.0–0.5)
Eosinophils Relative: 1 %
HCT: 49.9 % (ref 39.0–52.0)
Hemoglobin: 17 g/dL (ref 13.0–17.0)
Immature Granulocytes: 0 %
Lymphocytes Relative: 4 %
Lymphs Abs: 0.8 10*3/uL (ref 0.7–4.0)
MCH: 30.3 pg (ref 26.0–34.0)
MCHC: 34.1 g/dL (ref 30.0–36.0)
MCV: 88.9 fL (ref 80.0–100.0)
Monocytes Absolute: 0.8 10*3/uL (ref 0.1–1.0)
Monocytes Relative: 4 %
Neutro Abs: 15.3 10*3/uL — ABNORMAL HIGH (ref 1.7–7.7)
Neutrophils Relative %: 91 %
Platelets: 318 10*3/uL (ref 150–400)
RBC: 5.61 MIL/uL (ref 4.22–5.81)
RDW: 13.2 % (ref 11.5–15.5)
WBC: 17.1 10*3/uL — ABNORMAL HIGH (ref 4.0–10.5)
nRBC: 0 % (ref 0.0–0.2)

## 2021-10-08 LAB — COMPREHENSIVE METABOLIC PANEL
ALT: 38 U/L (ref 0–44)
AST: 22 U/L (ref 15–41)
Albumin: 4.2 g/dL (ref 3.5–5.0)
Alkaline Phosphatase: 87 U/L (ref 38–126)
Anion gap: 9 (ref 5–15)
BUN: 19 mg/dL (ref 6–20)
CO2: 21 mmol/L — ABNORMAL LOW (ref 22–32)
Calcium: 9 mg/dL (ref 8.9–10.3)
Chloride: 103 mmol/L (ref 98–111)
Creatinine, Ser: 1.13 mg/dL (ref 0.61–1.24)
GFR, Estimated: >60 mL/min (ref >60)
Glucose, Bld: 261 mg/dL — ABNORMAL HIGH (ref 70–99)
Potassium: 3.9 mmol/L (ref 3.5–5.1)
Sodium: 133 mmol/L — ABNORMAL LOW (ref 135–145)
Total Bilirubin: 1 mg/dL (ref 0.3–1.2)
Total Protein: 7.6 g/dL (ref 6.5–8.1)

## 2021-10-08 LAB — URINALYSIS, ROUTINE W REFLEX MICROSCOPIC
Bilirubin Urine: NEGATIVE
Glucose, UA: >=500 mg/dL — AB
Hgb urine dipstick: NEGATIVE
Ketones, ur: 15 mg/dL — AB
Leukocytes,Ua: NEGATIVE
Nitrite: NEGATIVE
Protein, ur: 30 mg/dL — AB
Specific Gravity, Urine: >=1.03 (ref 1.005–1.030)
pH: 5 (ref 5.0–8.0)

## 2021-10-08 LAB — URINALYSIS, MICROSCOPIC (REFLEX): WBC, UA: NONE SEEN WBC/hpf (ref 0–5)

## 2021-10-08 LAB — LIPASE, BLOOD: Lipase: 36 U/L (ref 11–51)

## 2021-10-08 MED ORDER — SODIUM CHLORIDE 0.9 % IV BOLUS
1000.0000 mL | Freq: Once | INTRAVENOUS | Status: AC
  Administered 2021-10-08: 1000 mL via INTRAVENOUS

## 2021-10-08 MED ORDER — HYDROMORPHONE HCL 1 MG/ML IJ SOLN
0.5000 mg | Freq: Once | INTRAMUSCULAR | Status: AC
  Administered 2021-10-08: 0.5 mg via INTRAVENOUS
  Filled 2021-10-08: qty 1

## 2021-10-08 MED ORDER — KETOROLAC TROMETHAMINE 30 MG/ML IJ SOLN
10.0000 mg | Freq: Once | INTRAMUSCULAR | Status: AC
  Administered 2021-10-08: 9.9 mg via INTRAVENOUS
  Filled 2021-10-08: qty 1

## 2021-10-08 MED ORDER — ONDANSETRON HCL 4 MG/2ML IJ SOLN
4.0000 mg | Freq: Once | INTRAMUSCULAR | Status: AC
  Administered 2021-10-08: 4 mg via INTRAVENOUS
  Filled 2021-10-08: qty 2

## 2021-10-08 MED ORDER — METHOCARBAMOL 500 MG PO TABS: 500.0000 mg | ORAL_TABLET | Freq: Two times a day (BID) | ORAL | 0 refills | Status: AC

## 2021-10-08 NOTE — ED Notes (Signed)
Patient unable to provide urine sample at this time

## 2021-10-08 NOTE — ED Triage Notes (Signed)
Right flank pain for an hour. States he is passing a kidney stone.  ?

## 2021-10-08 NOTE — ED Notes (Signed)
Patient transported to CT 

## 2021-10-08 NOTE — ED Provider Notes (Signed)
?[facility] EMERGENCY DEPARTMENT ?Provider Note ? ? ?CSN: [id number] ?Arrival date & time: 10/08/21  1331 ? ?  ? ?History ? ?Chief Complaint  ?Patient presents with  ? Flank Pain  ? ? ?Levi Meyers is a 56 y.o. male. ? ?56 year old male with history of kidney stones presents with complaint of right flank pain onset 10:00 this morning while he was sitting at his desk working.  Pain does not radiate, nothing makes pain better or worse.  Is associated with nausea and vomiting.  Reports history of kidney stones and states this pain feels similar.  Denies changes in bowel or bladder habits or hematuria.  Patient takes a daily aspirin otherwise is not anticoagulated.  Denies fevers or chills.  Reports prior stone removal, no prior stents or lithotripsy. ? ? ?  ? ?Home Medications ?Prior to Admission medications   ?Medication Sig Start Date End Date Taking? Authorizing Provider  ?methocarbamol (ROBAXIN) 500 MG tablet Take 1 tablet (500 mg total) by mouth 2 (two) times daily. 10/08/21  Yes Tacy Learn, PA-C  ?aspirin 81 MG tablet Take 81 mg by mouth daily.    [provider]  ?atorvastatin (LIPITOR) 20 MG tablet Take 20 mg by mouth daily.    [provider]  ?buPROPion (WELLBUTRIN XL) 300 MG 24 hr tablet Take 300 mg by mouth daily.     [provider]  ?Continuous Blood Gluc Sensor (FREESTYLE LIBRE 2 SENSOR) MISC 1 each by Other route every 14 (fourteen) days. 12/29/20   [provider]  ?Continuous Blood Gluc Sensor (FREESTYLE LIBRE 2 SENSOR) MISC 1 each by Does not apply route every 14 (fourteen) days. 12/29/20   [provider]  ?dicyclomine (BENTYL) 20 MG tablet Take 1 tablet (20 mg total) by mouth 2 (two) times daily as needed for spasms. 12/17/19   Hayden Rasmussen, MD  ?gabapentin (NEURONTIN) 300 MG capsule Take 600 mg by mouth 3 (three) times daily.    [provider]  ?ibuprofen (ADVIL,MOTRIN) 200 MG tablet Take 400 mg by mouth 2 (two) times daily as  needed (pain).    [provider]  ?insulin aspart (NOVOLOG) 100 UNIT/ML FlexPen Inject 30 Units into the skin daily before supper.     [provider]  ?insulin glargine (LANTUS) 100 UNIT/ML Solostar Pen Inject 60 Units into the skin 2 (two) times daily.     [provider]  ?lisinopril (ZESTRIL) 10 MG tablet Take 1 tablet (10 mg total) by mouth daily. 02/22/21   Sueanne Margarita, MD  ?metoprolol tartrate (LOPRESSOR) 25 MG tablet Take 1 tablet (25 mg total) by mouth 2 (two) times daily. 05/24/21 08/22/21  Sueanne Margarita, MD  ?nitroGLYCERIN (NITROSTAT) 0.4 MG SL tablet Place 1 tablet (0.4 mg total) under the tongue every 5 (five) minutes as needed for chest pain. 07/24/20 01/24/21  Burtis Junes, NP  ?traZODone (DESYREL) 100 MG tablet Take 100 mg by mouth at bedtime.    [provider]  ?TRULICITY 3 BP/1.0CH SOPN Inject 3 mg into the skin once a week. Mondays 12/01/20   [provider]  ?   ? ?Allergies    ?Byetta 10 mcg pen [exenatide], Codeine, Invokana [canagliflozin], and Victoza [liraglutide]   ? ?Review of Systems   ?Review of Systems ?Negative except as per HPI. ?Physical Exam ?Updated Vital Signs ?BP 118/80 (BP Location: Right Arm)   Pulse 95   Temp 98.1 ?F (36.7 ?C)   Resp 18  Ht '5\' 5"'$  (1.651 m)   Wt 96.1 kg   SpO2 95%   BMI 35.26 kg/m?  ?Physical Exam ?Vitals and nursing note reviewed.  ?Constitutional:   ?   General: He is not in acute distress. ?   Appearance: He is well-developed. He is not diaphoretic.  ?   Comments: Appears uncomfortable.  ?HENT:  ?   Head: Normocephalic and atraumatic.  ?Cardiovascular:  ?   Rate and Rhythm: Normal rate and regular rhythm.  ?   Pulses: Normal pulses.  ?   Heart sounds: Normal heart sounds.  ?Pulmonary:  ?   Effort: Pulmonary effort is normal.  ?   Breath sounds: Normal breath sounds.  ?Abdominal:  ?   Palpations: Abdomen is soft.  ?   Tenderness: There is no abdominal tenderness. There is no right CVA tenderness or left  CVA tenderness.  ?Musculoskeletal:  ?   Right lower leg: No edema.  ?   Left lower leg: No edema.  ?Skin: ?   General: Skin is warm and dry.  ?   Findings: No erythema or rash.  ?Neurological:  ?   Mental Status: He is alert and oriented to person, place, and time.  ?Psychiatric:     ?   Behavior: Behavior normal.  ? ? ?ED Results / Procedures / Treatments   ?Labs ?(all labs ordered are listed, but only abnormal results are displayed) ?Labs Reviewed  ?URINALYSIS, ROUTINE W REFLEX MICROSCOPIC - Abnormal; Notable for the following components:  ?    Result Value  ? APPearance HAZY (*)   ? Glucose, UA >=500 (*)   ? Ketones, ur 15 (*)   ? Protein, ur 30 (*)   ? All other components within normal limits  ?COMPREHENSIVE METABOLIC PANEL - Abnormal; Notable for the following components:  ? Sodium 133 (*)   ? CO2 21 (*)   ? Glucose, Bld 261 (*)   ? All other components within normal limits  ?CBC WITH DIFFERENTIAL/PLATELET - Abnormal; Notable for the following components:  ? WBC 17.1 (*)   ? Neutro Abs 15.3 (*)   ? All other components within normal limits  ?URINALYSIS, MICROSCOPIC (REFLEX) - Abnormal; Notable for the following components:  ? Bacteria, UA RARE (*)   ? All other components within normal limits  ?LIPASE, BLOOD  ? ? ?EKG ?None ? ?Radiology ?CT Renal Stone Study ? ?Result Date: 10/08/2021 ?CLINICAL DATA:  Right flank pain EXAM: CT ABDOMEN AND PELVIS WITHOUT CONTRAST TECHNIQUE: Multidetector CT imaging of the abdomen and pelvis was performed following the standard protocol without IV contrast. RADIATION DOSE REDUCTION: This exam was performed according to the departmental dose-optimization program which includes automated exposure control, adjustment of the mA and/or kV according to patient size and/or use of iterative reconstruction technique. COMPARISON:  Multiple priors, most recent CT abdomen and pelvis dated Dec 17, 2019 FINDINGS: Lower chest: Small solid pulmonary nodule of the left lower lobe, unchanged in  size compared prior with prior exams and favored to be benign. Unchanged pericardial calcifications. Hepatobiliary: No focal liver abnormality is seen. Status post cholecystectomy. No biliary dilatation. Pancreas: Unremarkable. No pancreatic ductal dilatation or surrounding inflammatory changes. Spleen: Normal in size without focal abnormality. Adrenals/Urinary Tract: Bilateral adrenal glands are unremarkable. No hydronephrosis. Punctate nonobstructing stones of the bilateral kidneys. Exophytic simple cyst of the right kidney. Stomach/Bowel: Stomach is within normal limits. No evidence of bowel wall thickening, distention, or inflammatory changes. Vascular/Lymphatic: Aortic atherosclerosis. No enlarged abdominal or pelvic lymph  nodes. Reproductive: Prostate is unremarkable. Other: Small fat containing right inguinal hernia. Musculoskeletal: No acute or significant osseous findings. IMPRESSION: 1. No acute findings in the abdomen or pelvis, including no evidence of obstructive uropathy. 2. Punctate bilateral nonobstructing renal stones. 3. Unchanged pericardial calcifications. 4.  Aortic Atherosclerosis (ICD10-I70.0). Electronically Signed   By: Yetta Glassman M.D.   On: 10/08/2021 14:46   ? ?Procedures ?Procedures  ? ? ?Medications Ordered in ED ?Medications  ?ketorolac (TORADOL) 30 MG/ML injection 9.9 mg (has no administration in time range)  ?sodium chloride 0.9 % bolus 1,000 mL (0 mLs Intravenous Stopped 10/08/21 1546)  ?ondansetron (ZOFRAN) injection 4 mg (4 mg Intravenous Given 10/08/21 1427)  ?HYDROmorphone (DILAUDID) injection 0.5 mg (0.5 mg Intravenous Given 10/08/21 1421)  ? ? ?ED Course/ Medical Decision Making/ A&P ?  ?                        ?Medical Decision Making ?Amount and/or Complexity of Data Reviewed ?Labs: ordered. ?Radiology: ordered. ? ?Risk ?Prescription drug management. ? ? ?This patient presents to the ED for concern of right flank pain with nausea and vomiting, this involves an extensive  number of treatment options, and is a complaint that carries with it a high risk of complications and morbidity.  The differential diagnosis includes but not limited to ureterolithiasis, radiculopathy, muscle str

## 2021-10-08 NOTE — Discharge Instructions (Addendum)
Robaxin as needed as prescribed for pain.  Recheck with your primary care provider for reevaluation.  Return to the emergency room for worsening or concerning symptoms. ?

## 2022-02-14 ENCOUNTER — Telehealth: Payer: Self-pay | Admitting: *Deleted

## 2022-02-14 NOTE — Chronic Care Management (AMB) (Signed)
  Care Coordination  Note  02/14/2022 Name: ELAI VANWYK MRN: [id number] DOB: [birth year]  FRITZ CAUTHON is a 56 y.o. year old male who is a primary care patient of Sharilyn Sites, MD. I reached out to Lorin Glass by phone today to offer care coordination services.      Mr. Housman was given information about Care Coordination services today including:  The Care Coordination services include support from the care team which includes your Nurse Coordinator, Clinical Social Worker, or Pharmacist.  The Care Coordination team is here to help remove barriers to the health concerns and goals most important to you. Care Coordination services are voluntary and the patient may decline or stop services at any time by request to their care team member.   Patient agreed to services and verbal consent obtained.   Follow up plan: Telephone appointment with care coordination team member scheduled for:02/20/22  [place]: [phone]

## 2022-02-20 ENCOUNTER — Ambulatory Visit: Payer: Self-pay | Admitting: *Deleted

## 2022-02-20 NOTE — Patient Outreach (Signed)
  Care Coordination   02/20/2022 Name: Levi Meyers MRN: [id number] DOB: 10-10-1965   Care Coordination Outreach Attempts:  An unsuccessful telephone outreach was attempted today to offer the patient information about available care coordination services as a benefit of their health plan.   Follow Up Plan:  Additional outreach attempts will be made to offer the patient care coordination information and services.   Encounter Outcome:  No Answer  Care Coordination Interventions Activated:  No   Care Coordination Interventions:  No, not indicated    Valente David, RN, MSN, [place] [facility]  Care Coordinator [phone]

## 2022-03-27 ENCOUNTER — Other Ambulatory Visit: Payer: Self-pay | Admitting: Cardiology

## 2022-03-29 ENCOUNTER — Telehealth: Payer: Self-pay | Admitting: *Deleted

## 2022-03-29 NOTE — Chronic Care Management (AMB) (Signed)
  Care Coordination   Note   03/29/2022 Name: JEREN DUFRANE MRN: [id number] DOB: 1965/12/29  EVERSON MOTT is a 56 y.o. year old male who sees Sharilyn Sites, MD for primary care. I reached out to Lorin Glass by phone today to reschedule initial call with [facility] for care coordination services.    Follow up plan:  Telephone appointment with care coordination team member scheduled for:  04/01/22  Encounter Outcome:  Pt. Scheduled  [place]  Direct Dial: [phone]

## 2022-04-01 ENCOUNTER — Encounter: Payer: Self-pay | Admitting: *Deleted

## 2022-04-01 ENCOUNTER — Ambulatory Visit: Payer: Self-pay | Admitting: *Deleted

## 2022-04-01 NOTE — Patient Instructions (Signed)
Visit Information  Thank you for taking time to visit with me today. Please don't hesitate to contact me if I can be of assistance to you before our next scheduled telephone appointment.  Following are the goals we discussed today:  Continue monitoring daily blood sugar Adhere to diabetic diet  Our next appointment is by telephone on 11/27 at 1pm  Please call the care guide team at [phone] if you need to cancel or reschedule your appointment.   Please call the Suicide and Crisis Lifeline: 988 call the [place] National Suicide Prevention Lifeline: 208-049-8378 or TTY: (413) 205-1435 TTY 567-524-3474) to talk to a trained counselor call 1-800-273-TALK (toll free, 24 hour hotline) call the [facility]: [phone] call 911 if you are experiencing a Mental Health or [place] or need someone to talk to.  Patient verbalizes understanding of instructions and care plan provided today and agrees to view in [place]. Active MyChart status and patient understanding of how to access instructions and care plan via MyChart confirmed with patient.     The patient has been provided with contact information for the care management team and has been advised to call with any health related questions or concerns.   Valente David, RN, MSN, [place] Care Management Care Management Coordinator [phone]

## 2022-04-01 NOTE — Patient Outreach (Signed)
  Care Coordination   Initial Visit Note   04/01/2022 Name: Levi Meyers MRN: [id number] DOB: Oct 06, 1965  Levi Meyers is a 56 y.o. year old male who sees Levi Sites, MD for primary care. I spoke with  Levi Meyers by phone today.  What matters to the patients health and wellness today?  Member state he has had trouble in the past managing his diabetes, but feel it is stable currently. Last A1C was 8.1, daily blood sugars range 100's-250.  State he has hypoglycemic symptoms if it is less then 90 so he try to keep it at least 100.  Active with endocrinology, wife is a nurse that also helps with health management.     Goals Addressed               This Visit's Progress     Effective DM management (pt-stated)        Care Coordination Interventions: Provided education to patient about basic DM disease process Reviewed medications with patient and discussed importance of medication adherence Counseled on importance of regular laboratory monitoring as prescribed Discussed plans with patient for ongoing care management follow up and provided patient with direct contact information for care management team Reviewed scheduled/upcoming provider appointments including: Need for yearly PCP visit and follow up with endocrinology on 9/20 Advised patient, providing education and rationale, to check cbg 3 times a day and record, calling provider for findings outside established parameters Assessed social determinant of health barriers Encouraged to adhere to diabetic diet and exercise regime         SDOH assessments and interventions completed:  Yes  SDOH Interventions Today    Flowsheet Row Most Recent Value  SDOH Interventions   Food Insecurity Interventions Intervention Not Indicated  Housing Interventions Intervention Not Indicated  Transportation Interventions Intervention Not Indicated  Utilities Interventions Intervention Not Indicated        Care Coordination  Interventions Activated:  Yes  Care Coordination Interventions:  Yes, provided   Follow up plan: Follow up call scheduled for 11/27    Encounter Outcome:  Pt. Visit Completed   Levi David, RN, MSN, [place] [facility] Care Management Coordinator [phone]

## 2022-04-10 ENCOUNTER — Other Ambulatory Visit: Payer: Self-pay | Admitting: Cardiology

## 2022-06-17 ENCOUNTER — Ambulatory Visit: Payer: Self-pay | Admitting: *Deleted

## 2022-06-17 ENCOUNTER — Encounter: Payer: Self-pay | Admitting: *Deleted

## 2022-06-17 NOTE — Patient Outreach (Signed)
  Care Coordination   Follow Up Visit Note   06/17/2022 Name: Levi Meyers MRN: [id number] DOB: July 23, 1965  Levi Meyers is a 56 y.o. year old male who sees Sharilyn Sites, MD for primary care. I spoke with  Levi Meyers by phone today.  What matters to the patients health and wellness today?  Diabetes management    Goals Addressed               This Visit's Progress     Patient Stated     Effective DM management (pt-stated)        Care Coordination Interventions: Reviewed medications with patient and discussed importance of medication adherence Counseled on importance of regular laboratory monitoring as prescribed Discussed plans with patient for ongoing care management follow up and provided patient with direct contact information for care management team Reviewed scheduled/upcoming provider appointments including: Need for yearly PCP visit and follow up with endocrinology on 07/17/22 Advised patient, providing education and rationale, to check cbg 4 times a day and PRN with CGM and record, calling endocrinologist for findings outside established parameters Assessed social determinant of health barriers Discussed diet and assessed need for educational resources. Patient declined at this time. Discussed use of CGM to monitor blood sugar.  Patient can transmit results to endocrinologist at any time Discussed home monitoring Blood sugar was 235 this morning. Advised to reach out to endocrinologist if he has 3 consecutive readings above 200 Discussed recent med changes. Increased Mounjaro to 10 units weekly Provided with [facility] telephone number and encouraged to reach out as needed         SDOH assessments and interventions completed:  Yes  SDOH Interventions Today    Flowsheet Row Most Recent Value  SDOH Interventions   Housing Interventions Intervention Not Indicated  Transportation Interventions Intervention Not Indicated  Financial Strain Interventions Intervention  Not Indicated        Care Coordination Interventions:  Yes, provided   Follow up plan: Follow up call scheduled for 08/16/22    Encounter Outcome:  Pt. Visit Completed   Chong Sicilian, BSN, RN-BC RN Care Coordinator [place]: [phone] Main #: [phone]

## 2022-08-07 ENCOUNTER — Other Ambulatory Visit: Payer: Self-pay | Admitting: Cardiology

## 2022-08-16 ENCOUNTER — Ambulatory Visit: Payer: Self-pay | Admitting: *Deleted

## 2022-08-16 ENCOUNTER — Encounter: Payer: Self-pay | Admitting: *Deleted

## 2022-08-16 NOTE — Patient Outreach (Signed)
  Care Coordination   Follow Up Visit Note   08/16/2022 Name: Levi Meyers MRN: [id number] DOB: 03-04-1966  Levi Meyers is a 57 y.o. year old male who sees Sharilyn Sites, MD for primary care. I spoke with  Levi Meyers by phone today.  What matters to the patients health and wellness today?  Managing blood sugar    Goals Addressed               This Visit's Progress     Patient Stated     Effective DM management (pt-stated)   On track     Care Coordination Interventions: Reviewed medications with patient and discussed importance of medication adherence Counseled on importance of regular laboratory monitoring as prescribed Reviewed scheduled/upcoming provider appointments including: Dr Steffanie Dunn ([facility]) on 10/07/22 Advised patient, providing education and rationale, to check cbg 4 times a day and PRN with CGM and record, calling endocrinologist for findings outside established parameters Assessed social determinant of health barriers Discussed use of CGM to monitor blood sugar.  Patient can transmit results to endocrinologist at any time Discussed home monitoring Blood sugar was 170 this morning No readings below 70 Unsure of number of readings above 200 but reports overall improvement in readings since increasing Mounjaro to 10 units weekly Assessed for additional resource or care coordination needs. Patient denies at this time.  Assessed for need to continue following through care coordination program. Patient denies at this time and says he will reach out if he has any resource or care coordination needs Provided with [facility] telephone number and encouraged to reach out as needed         SDOH assessments and interventions completed:  Yes  SDOH Interventions Today    Flowsheet Row Most Recent Value  SDOH Interventions   Food Insecurity Interventions Intervention Not Indicated  Housing Interventions Intervention Not Indicated  Transportation Interventions  Intervention Not Indicated        Care Coordination Interventions:  Yes, provided   Follow up plan: No further intervention required.   Encounter Outcome:  Pt. Visit Completed   Chong Sicilian, BSN, RN-BC RN Care Coordinator [place] Direct Dial: [phone] Main #: [phone]

## 2022-09-03 ENCOUNTER — Other Ambulatory Visit: Payer: Self-pay | Admitting: Cardiology

## 2022-10-10 ENCOUNTER — Other Ambulatory Visit (HOSPITAL_COMMUNITY): Payer: Self-pay

## 2022-10-10 ENCOUNTER — Other Ambulatory Visit: Payer: Self-pay

## 2022-10-10 MED ORDER — OZEMPIC (0.25 OR 0.5 MG/DOSE) 2 MG/3ML ~~LOC~~ SOPN
0.5000 mg | PEN_INJECTOR | SUBCUTANEOUS | 5 refills | Status: AC
Start: 2022-10-10 — End: ?
  Filled 2022-10-10: qty 3, 28d supply, fill #0
  Filled 2022-11-13: qty 3, 28d supply, fill #1

## 2022-11-15 ENCOUNTER — Other Ambulatory Visit (HOSPITAL_COMMUNITY): Payer: Self-pay

## 2022-11-25 ENCOUNTER — Other Ambulatory Visit (HOSPITAL_COMMUNITY): Payer: Self-pay

## 2022-11-25 MED ORDER — OZEMPIC (2 MG/DOSE) 8 MG/3ML ~~LOC~~ SOPN
2.0000 mg | PEN_INJECTOR | SUBCUTANEOUS | 2 refills | Status: DC
  Filled 2022-11-25 – 2022-12-10 (×2): qty 3, 28d supply, fill #0
  Filled 2023-01-02: qty 3, 28d supply, fill #1
  Filled 2023-02-07: qty 3, 28d supply, fill #2

## 2022-11-26 ENCOUNTER — Other Ambulatory Visit (HOSPITAL_COMMUNITY): Payer: Self-pay

## 2022-12-10 ENCOUNTER — Other Ambulatory Visit (HOSPITAL_COMMUNITY): Payer: Self-pay

## 2022-12-10 ENCOUNTER — Other Ambulatory Visit: Payer: Self-pay

## 2022-12-10 ENCOUNTER — Encounter: Payer: Self-pay | Admitting: Pharmacist

## 2022-12-19 ENCOUNTER — Other Ambulatory Visit: Payer: Self-pay

## 2022-12-19 ENCOUNTER — Other Ambulatory Visit (HOSPITAL_COMMUNITY): Payer: Self-pay

## 2022-12-19 MED ORDER — LANTUS SOLOSTAR 100 UNIT/ML ~~LOC~~ SOPN
70.0000 [IU] | PEN_INJECTOR | Freq: Two times a day (BID) | SUBCUTANEOUS | 5 refills | Status: AC
  Filled 2022-12-19 (×3): qty 45, 30d supply, fill #0

## 2023-01-01 ENCOUNTER — Other Ambulatory Visit (HOSPITAL_COMMUNITY): Payer: Self-pay

## 2023-01-02 ENCOUNTER — Other Ambulatory Visit: Payer: Self-pay

## 2023-01-03 ENCOUNTER — Other Ambulatory Visit: Payer: Self-pay

## 2023-01-28 ENCOUNTER — Other Ambulatory Visit: Payer: Self-pay

## 2023-01-28 ENCOUNTER — Other Ambulatory Visit (HOSPITAL_COMMUNITY): Payer: Self-pay

## 2023-01-28 MED ORDER — TRESIBA FLEXTOUCH 200 UNIT/ML ~~LOC~~ SOPN
120.0000 [IU] | PEN_INJECTOR | Freq: Every day | SUBCUTANEOUS | 5 refills | Status: AC
  Filled 2023-01-28: qty 18, 30d supply, fill #0
  Filled 2023-03-17: qty 18, 30d supply, fill #1
  Filled 2023-04-11: qty 18, 30d supply, fill #2
  Filled 2023-06-05: qty 18, 30d supply, fill #3
  Filled 2023-08-22: qty 18, 30d supply, fill #4

## 2023-02-08 ENCOUNTER — Other Ambulatory Visit (HOSPITAL_COMMUNITY): Payer: Self-pay

## 2023-03-17 ENCOUNTER — Other Ambulatory Visit (HOSPITAL_BASED_OUTPATIENT_CLINIC_OR_DEPARTMENT_OTHER): Payer: Self-pay

## 2023-03-17 MED ORDER — OZEMPIC (2 MG/DOSE) 8 MG/3ML ~~LOC~~ SOPN
2.0000 mg | PEN_INJECTOR | SUBCUTANEOUS | 2 refills | Status: DC
  Filled 2023-03-17: qty 3, 28d supply, fill #0
  Filled 2023-04-11: qty 3, 28d supply, fill #1
  Filled ????-??-??: fill #1

## 2023-04-02 ENCOUNTER — Other Ambulatory Visit (HOSPITAL_BASED_OUTPATIENT_CLINIC_OR_DEPARTMENT_OTHER): Payer: Self-pay

## 2023-04-02 MED ORDER — BD PEN NEEDLE SHORT U/F 31G X 8 MM MISC
6 refills | Status: AC
  Filled 2023-04-02: qty 300, 90d supply, fill #0

## 2023-04-07 ENCOUNTER — Other Ambulatory Visit (HOSPITAL_BASED_OUTPATIENT_CLINIC_OR_DEPARTMENT_OTHER): Payer: Self-pay

## 2023-04-07 MED ORDER — FREESTYLE LIBRE 3 SENSOR MISC
1.0000 | 1 refills | Status: AC
  Filled 2023-04-07 – 2024-02-02 (×2): qty 2, 28d supply, fill #0
  Filled 2024-03-02: qty 2, 28d supply, fill #1

## 2023-04-11 ENCOUNTER — Other Ambulatory Visit (HOSPITAL_BASED_OUTPATIENT_CLINIC_OR_DEPARTMENT_OTHER): Payer: Self-pay

## 2023-04-17 ENCOUNTER — Other Ambulatory Visit (HOSPITAL_COMMUNITY): Payer: Self-pay

## 2023-04-17 MED ORDER — MOUNJARO 10 MG/0.5ML ~~LOC~~ SOAJ
10.0000 mg | SUBCUTANEOUS | 2 refills | Status: DC
  Filled 2023-04-17 – 2023-04-24 (×2): qty 2, 28d supply, fill #0

## 2023-04-18 ENCOUNTER — Other Ambulatory Visit (HOSPITAL_COMMUNITY): Payer: Self-pay

## 2023-04-18 MED ORDER — ATORVASTATIN CALCIUM 20 MG PO TABS
20.0000 mg | ORAL_TABLET | Freq: Every day | ORAL | 3 refills | Status: DC
  Filled 2023-04-18 – 2024-03-23 (×2): qty 90, 90d supply, fill #0

## 2023-04-23 ENCOUNTER — Other Ambulatory Visit (HOSPITAL_BASED_OUTPATIENT_CLINIC_OR_DEPARTMENT_OTHER): Payer: Self-pay

## 2023-04-24 ENCOUNTER — Other Ambulatory Visit (HOSPITAL_BASED_OUTPATIENT_CLINIC_OR_DEPARTMENT_OTHER): Payer: Self-pay

## 2023-04-24 ENCOUNTER — Other Ambulatory Visit (HOSPITAL_COMMUNITY): Payer: Self-pay

## 2023-04-28 ENCOUNTER — Other Ambulatory Visit (HOSPITAL_BASED_OUTPATIENT_CLINIC_OR_DEPARTMENT_OTHER): Payer: Self-pay

## 2023-04-28 MED ORDER — VITAMIN D (ERGOCALCIFEROL) 1.25 MG (50000 UNIT) PO CAPS
50000.0000 [IU] | ORAL_CAPSULE | ORAL | 0 refills | Status: AC
  Filled 2023-04-28: qty 4, 28d supply, fill #0

## 2023-05-20 ENCOUNTER — Other Ambulatory Visit (HOSPITAL_COMMUNITY): Payer: Self-pay

## 2023-05-20 MED ORDER — MOUNJARO 12.5 MG/0.5ML ~~LOC~~ SOAJ
12.5000 mg | SUBCUTANEOUS | 2 refills | Status: DC
  Filled 2023-05-20 – 2023-05-21 (×2): qty 2, 28d supply, fill #0
  Filled 2023-06-23 – 2023-06-27 (×3): qty 2, 28d supply, fill #1
  Filled 2023-07-21: qty 2, 28d supply, fill #2

## 2023-05-20 MED ORDER — GABAPENTIN 300 MG PO CAPS
600.0000 mg | ORAL_CAPSULE | Freq: Two times a day (BID) | ORAL | 1 refills | Status: DC
Start: 2023-05-20 — End: 2024-06-07
  Filled 2023-05-20: qty 360, 90d supply, fill #0
  Filled 2023-05-21: qty 120, 30d supply, fill #0
  Filled 2023-07-21: qty 360, 90d supply, fill #1
  Filled 2024-02-02: qty 240, 60d supply, fill #2

## 2023-05-21 ENCOUNTER — Other Ambulatory Visit (HOSPITAL_BASED_OUTPATIENT_CLINIC_OR_DEPARTMENT_OTHER): Payer: Self-pay

## 2023-05-22 ENCOUNTER — Other Ambulatory Visit (HOSPITAL_COMMUNITY): Payer: Self-pay

## 2023-06-05 ENCOUNTER — Other Ambulatory Visit (HOSPITAL_BASED_OUTPATIENT_CLINIC_OR_DEPARTMENT_OTHER): Payer: Self-pay

## 2023-06-23 ENCOUNTER — Other Ambulatory Visit: Payer: Self-pay

## 2023-06-27 ENCOUNTER — Other Ambulatory Visit (HOSPITAL_BASED_OUTPATIENT_CLINIC_OR_DEPARTMENT_OTHER): Payer: Self-pay

## 2023-06-27 ENCOUNTER — Other Ambulatory Visit (HOSPITAL_COMMUNITY): Payer: Self-pay

## 2023-07-21 ENCOUNTER — Other Ambulatory Visit: Payer: Self-pay

## 2023-08-01 ENCOUNTER — Other Ambulatory Visit (HOSPITAL_COMMUNITY): Payer: Self-pay

## 2023-08-01 MED ORDER — MOUNJARO 15 MG/0.5ML ~~LOC~~ SOAJ
15.0000 mg | SUBCUTANEOUS | 5 refills | Status: AC
  Filled 2023-08-01 – 2023-08-22 (×2): qty 2, 28d supply, fill #0
  Filled 2023-09-24: qty 2, 28d supply, fill #1
  Filled 2023-10-24: qty 2, 28d supply, fill #2
  Filled 2023-11-21: qty 2, 28d supply, fill #3
  Filled 2023-12-26: qty 2, 28d supply, fill #4
  Filled 2024-01-19: qty 2, 28d supply, fill #5

## 2023-08-22 ENCOUNTER — Other Ambulatory Visit (HOSPITAL_BASED_OUTPATIENT_CLINIC_OR_DEPARTMENT_OTHER): Payer: Self-pay

## 2023-09-01 ENCOUNTER — Other Ambulatory Visit: Payer: Self-pay

## 2023-09-01 ENCOUNTER — Emergency Department (HOSPITAL_BASED_OUTPATIENT_CLINIC_OR_DEPARTMENT_OTHER)
Admission: EM | Admit: 2023-09-01 | Discharge: 2023-09-01 | Discharge status: Against Medical Advice | Payer: Self-pay | Attending: Emergency Medicine | Admitting: Emergency Medicine

## 2023-09-01 ENCOUNTER — Encounter (HOSPITAL_BASED_OUTPATIENT_CLINIC_OR_DEPARTMENT_OTHER): Payer: Self-pay | Admitting: Emergency Medicine

## 2023-09-01 DIAGNOSIS — M791 Myalgia, unspecified site: Secondary | ICD-10-CM | POA: Insufficient documentation

## 2023-09-01 DIAGNOSIS — R059 Cough, unspecified: Secondary | ICD-10-CM | POA: Insufficient documentation

## 2023-09-01 DIAGNOSIS — Z5321 Procedure and treatment not carried out due to patient leaving prior to being seen by health care provider: Secondary | ICD-10-CM | POA: Insufficient documentation

## 2023-09-01 LAB — RESP PANEL BY RT-PCR (RSV, FLU A&B, COVID)  RVPGX2
Influenza A by PCR: POSITIVE — AB
Influenza B by PCR: NEGATIVE
Resp Syncytial Virus by PCR: NEGATIVE
SARS Coronavirus 2 by RT PCR: NEGATIVE

## 2023-09-01 MED ORDER — IBUPROFEN 400 MG PO TABS
400.0000 mg | ORAL_TABLET | Freq: Once | ORAL | Status: AC | PRN
  Administered 2023-09-01: 400 mg via ORAL
  Filled 2023-09-01: qty 1

## 2023-09-01 NOTE — ED Notes (Signed)
 Pt advise Registration he was leaving

## 2023-09-01 NOTE — ED Triage Notes (Signed)
 Cough, body aches, x 4 days.

## 2023-09-25 ENCOUNTER — Other Ambulatory Visit: Payer: Self-pay

## 2023-10-24 ENCOUNTER — Other Ambulatory Visit (HOSPITAL_BASED_OUTPATIENT_CLINIC_OR_DEPARTMENT_OTHER): Payer: Self-pay

## 2023-10-25 ENCOUNTER — Other Ambulatory Visit (HOSPITAL_BASED_OUTPATIENT_CLINIC_OR_DEPARTMENT_OTHER): Payer: Self-pay

## 2023-11-22 ENCOUNTER — Other Ambulatory Visit (HOSPITAL_BASED_OUTPATIENT_CLINIC_OR_DEPARTMENT_OTHER): Payer: Self-pay

## 2023-12-26 ENCOUNTER — Other Ambulatory Visit (HOSPITAL_BASED_OUTPATIENT_CLINIC_OR_DEPARTMENT_OTHER): Payer: Self-pay

## 2023-12-27 ENCOUNTER — Other Ambulatory Visit (HOSPITAL_BASED_OUTPATIENT_CLINIC_OR_DEPARTMENT_OTHER): Payer: Self-pay

## 2024-01-06 ENCOUNTER — Other Ambulatory Visit (HOSPITAL_COMMUNITY): Payer: Self-pay

## 2024-01-06 ENCOUNTER — Other Ambulatory Visit: Payer: Self-pay

## 2024-01-06 MED ORDER — MOUNJARO 15 MG/0.5ML ~~LOC~~ SOAJ
15.0000 mg | SUBCUTANEOUS | 1 refills | Status: DC
  Filled 2024-02-16: qty 6, 84d supply, fill #0
  Filled 2024-05-10: qty 6, 84d supply, fill #1

## 2024-01-06 MED ORDER — HUMALOG KWIKPEN 100 UNIT/ML ~~LOC~~ SOPN
20.0000 [IU] | PEN_INJECTOR | Freq: Three times a day (TID) | SUBCUTANEOUS | 1 refills | Status: AC
  Filled 2024-01-06: qty 54, 90d supply, fill #0
  Filled 2024-01-07: qty 15, 25d supply, fill #0
  Filled 2024-01-07: qty 30, 50d supply, fill #0
  Filled 2024-01-07: qty 45, 75d supply, fill #0

## 2024-01-06 MED ORDER — TRESIBA FLEXTOUCH 200 UNIT/ML ~~LOC~~ SOPN
90.0000 [IU] | PEN_INJECTOR | Freq: Every day | SUBCUTANEOUS | 1 refills | Status: AC
  Filled 2024-01-06: qty 9, 20d supply, fill #0
  Filled 2024-01-06: qty 39, 86d supply, fill #0
  Filled 2024-03-10: qty 39, 86d supply, fill #1
  Filled 2024-03-10: qty 12, 26d supply, fill #1

## 2024-01-06 MED ORDER — BD PEN NEEDLE SHORT ULTRAFINE 31G X 8 MM MISC
1.0000 | Freq: Four times a day (QID) | 99 refills | Status: AC
  Filled 2024-01-06: qty 100, 25d supply, fill #0
  Filled 2024-01-06: qty 400, 100d supply, fill #0
  Filled 2024-01-06 (×2): qty 100, 25d supply, fill #0

## 2024-01-06 MED ORDER — FREESTYLE LIBRE 3 SENSOR MISC
3 refills | Status: AC
  Filled 2024-01-06: qty 6, 84d supply, fill #0
  Filled 2024-01-06: qty 2, 28d supply, fill #0
  Filled 2024-04-01: qty 2, 28d supply, fill #1
  Filled 2024-04-26: qty 2, 28d supply, fill #2
  Filled 2024-05-24: qty 2, 28d supply, fill #3
  Filled 2024-06-21: qty 2, 28d supply, fill #4
  Filled 2024-07-21: qty 2, 28d supply, fill #5
  Filled 2024-08-17: qty 2, 28d supply, fill #6

## 2024-01-07 ENCOUNTER — Other Ambulatory Visit (HOSPITAL_COMMUNITY): Payer: Self-pay

## 2024-01-19 ENCOUNTER — Other Ambulatory Visit (HOSPITAL_COMMUNITY): Payer: Self-pay

## 2024-01-21 ENCOUNTER — Other Ambulatory Visit (HOSPITAL_COMMUNITY): Payer: Self-pay

## 2024-01-29 ENCOUNTER — Other Ambulatory Visit (HOSPITAL_BASED_OUTPATIENT_CLINIC_OR_DEPARTMENT_OTHER): Payer: Self-pay

## 2024-01-29 MED ORDER — INSULIN LISPRO (1 UNIT DIAL) 100 UNIT/ML (KWIKPEN)
40.0000 [IU] | PEN_INJECTOR | Freq: Every day | SUBCUTANEOUS | 2 refills | Status: AC
  Filled 2024-01-29: qty 15, 37d supply, fill #0
  Filled 2024-02-04: qty 12, 30d supply, fill #0

## 2024-01-29 MED ORDER — LISINOPRIL 10 MG PO TABS
10.0000 mg | ORAL_TABLET | Freq: Every day | ORAL | 2 refills | Status: AC
  Filled 2024-01-29 – 2024-03-02 (×2): qty 90, 90d supply, fill #0
  Filled 2024-06-14: qty 90, 90d supply, fill #1

## 2024-01-29 MED ORDER — BUPROPION HCL ER (XL) 300 MG PO TB24
300.0000 mg | ORAL_TABLET | Freq: Every day | ORAL | 2 refills | Status: AC
  Filled 2024-01-29 – 2024-04-12 (×2): qty 90, 90d supply, fill #0
  Filled 2024-07-08: qty 90, 90d supply, fill #1

## 2024-02-03 ENCOUNTER — Other Ambulatory Visit: Payer: Self-pay

## 2024-02-03 ENCOUNTER — Other Ambulatory Visit (HOSPITAL_BASED_OUTPATIENT_CLINIC_OR_DEPARTMENT_OTHER): Payer: Self-pay

## 2024-02-04 ENCOUNTER — Other Ambulatory Visit (HOSPITAL_COMMUNITY): Payer: Self-pay

## 2024-02-04 ENCOUNTER — Other Ambulatory Visit (HOSPITAL_BASED_OUTPATIENT_CLINIC_OR_DEPARTMENT_OTHER): Payer: Self-pay

## 2024-02-16 ENCOUNTER — Other Ambulatory Visit (HOSPITAL_COMMUNITY): Payer: Self-pay

## 2024-03-03 ENCOUNTER — Other Ambulatory Visit (HOSPITAL_BASED_OUTPATIENT_CLINIC_OR_DEPARTMENT_OTHER): Payer: Self-pay

## 2024-03-10 ENCOUNTER — Other Ambulatory Visit (HOSPITAL_COMMUNITY): Payer: Self-pay

## 2024-03-11 ENCOUNTER — Emergency Department (HOSPITAL_BASED_OUTPATIENT_CLINIC_OR_DEPARTMENT_OTHER)
Admission: EM | Admit: 2024-03-11 | Discharge: 2024-03-11 | Disposition: A | Discharge status: Home / Self Care | Attending: Emergency Medicine | Admitting: Emergency Medicine

## 2024-03-11 ENCOUNTER — Encounter (HOSPITAL_BASED_OUTPATIENT_CLINIC_OR_DEPARTMENT_OTHER): Payer: Self-pay | Admitting: Emergency Medicine

## 2024-03-11 ENCOUNTER — Other Ambulatory Visit: Payer: Self-pay

## 2024-03-11 DIAGNOSIS — L02213 Cutaneous abscess of chest wall: Secondary | ICD-10-CM | POA: Diagnosis present

## 2024-03-11 DIAGNOSIS — Z794 Long term (current) use of insulin: Secondary | ICD-10-CM | POA: Diagnosis not present

## 2024-03-11 DIAGNOSIS — L0291 Cutaneous abscess, unspecified: Secondary | ICD-10-CM

## 2024-03-11 DIAGNOSIS — Z7982 Long term (current) use of aspirin: Secondary | ICD-10-CM | POA: Insufficient documentation

## 2024-03-11 DIAGNOSIS — Z23 Encounter for immunization: Secondary | ICD-10-CM | POA: Insufficient documentation

## 2024-03-11 DIAGNOSIS — E1165 Type 2 diabetes mellitus with hyperglycemia: Secondary | ICD-10-CM | POA: Diagnosis not present

## 2024-03-11 LAB — COMPREHENSIVE METABOLIC PANEL WITH GFR
ALT: 23 U/L (ref 0–44)
AST: 15 U/L (ref 15–41)
Albumin: 4.2 g/dL (ref 3.5–5.0)
Alkaline Phosphatase: 142 U/L — ABNORMAL HIGH (ref 38–126)
Anion gap: 14 (ref 5–15)
BUN: 10 mg/dL (ref 6–20)
CO2: 21 mmol/L — ABNORMAL LOW (ref 22–32)
Calcium: 9.8 mg/dL (ref 8.9–10.3)
Chloride: 101 mmol/L (ref 98–111)
Creatinine, Ser: 1.07 mg/dL (ref 0.61–1.24)
GFR, Estimated: >60 mL/min (ref >60)
Glucose, Bld: 266 mg/dL — ABNORMAL HIGH (ref 70–99)
Potassium: 3.8 mmol/L (ref 3.5–5.1)
Sodium: 136 mmol/L (ref 135–145)
Total Bilirubin: 0.5 mg/dL (ref 0.0–1.2)
Total Protein: 7.4 g/dL (ref 6.5–8.1)

## 2024-03-11 LAB — CBC WITH DIFFERENTIAL/PLATELET
Abs Immature Granulocytes: 0.02 K/uL (ref 0.00–0.07)
Basophils Absolute: 0.1 K/uL (ref 0.0–0.1)
Basophils Relative: 1 %
Eosinophils Absolute: 0.2 K/uL (ref 0.0–0.5)
Eosinophils Relative: 2 %
HCT: 44.8 % (ref 39.0–52.0)
Hemoglobin: 15.4 g/dL (ref 13.0–17.0)
Immature Granulocytes: 0 %
Lymphocytes Relative: 35 %
Lymphs Abs: 3.3 K/uL (ref 0.7–4.0)
MCH: 30.4 pg (ref 26.0–34.0)
MCHC: 34.4 g/dL (ref 30.0–36.0)
MCV: 88.4 fL (ref 80.0–100.0)
Monocytes Absolute: 0.9 K/uL (ref 0.1–1.0)
Monocytes Relative: 10 %
Neutro Abs: 5.1 K/uL (ref 1.7–7.7)
Neutrophils Relative %: 52 %
Platelets: 319 K/uL (ref 150–400)
RBC: 5.07 MIL/uL (ref 4.22–5.81)
RDW: 12.9 % (ref 11.5–15.5)
WBC: 9.6 K/uL (ref 4.0–10.5)
nRBC: 0 % (ref 0.0–0.2)

## 2024-03-11 LAB — LACTIC ACID, PLASMA: Lactic Acid, Venous: 1.5 mmol/L (ref 0.5–1.9)

## 2024-03-11 LAB — CBG MONITORING, ED: Glucose-Capillary: 259 mg/dL — ABNORMAL HIGH (ref 70–99)

## 2024-03-11 MED ORDER — SODIUM CHLORIDE 0.9 % IV SOLN
100.0000 mg | Freq: Once | INTRAVENOUS | Status: AC
  Administered 2024-03-11: 100 mg via INTRAVENOUS
  Filled 2024-03-11: qty 100

## 2024-03-11 MED ORDER — LIDOCAINE HCL (PF) 1 % IJ SOLN
10.0000 mL | Freq: Once | INTRAMUSCULAR | Status: AC
  Administered 2024-03-11: 10 mL
  Filled 2024-03-11: qty 10

## 2024-03-11 MED ORDER — DOXYCYCLINE HYCLATE 100 MG PO CAPS: 100.0000 mg | ORAL_CAPSULE | Freq: Two times a day (BID) | ORAL | 0 refills | Status: DC

## 2024-03-11 MED ORDER — DOXYCYCLINE HYCLATE 100 MG PO CAPS
100.0000 mg | ORAL_CAPSULE | Freq: Two times a day (BID) | ORAL | 0 refills | Status: AC
  Filled 2024-03-11 – 2024-03-12 (×2): qty 20, 10d supply, fill #0

## 2024-03-11 MED ORDER — TETANUS-DIPHTH-ACELL PERTUSSIS 5-2.5-18.5 LF-MCG/0.5 IM SUSY
0.5000 mL | PREFILLED_SYRINGE | Freq: Once | INTRAMUSCULAR | Status: AC
  Administered 2024-03-11: 0.5 mL via INTRAMUSCULAR
  Filled 2024-03-11: qty 0.5

## 2024-03-11 NOTE — Discharge Instructions (Addendum)
 Evaluation today revealed that you have an abscess with surrounding cellulitis.  I am treating with doxycycline .  As we discussed the wound will continue to ooze.  Please keep it clean with soap and water.  Please follow-up with her PCP.  If symptoms worsen, there is more pustulant oozing, redness, develop a fever or any other concerning symptom please return to ED for further evaluation.

## 2024-03-11 NOTE — ED Triage Notes (Signed)
 Abscess present since Monday. Right axillary abscess. Drained at home by wife. Has large red border with dark eschar head at center. He feels it is now swelling up into his shoulder. Diabetic.

## 2024-03-11 NOTE — ED Provider Notes (Signed)
 [place] EMERGENCY DEPARTMENT AT [facility] Provider Note   CSN: [id number] Arrival date & time: 03/11/24  8197     Patient presents with: Abscess  HPI Levi Meyers. is a 58 y.o. male presenting for abscess.  Noticed that this past Monday.  Located in the right axillary region.  His wife mentioned that she noticed a whitehead in the area and was able to express some pustulant discharge.  This occurred yesterday.  Now has a large eschar head with surrounding red border.  The redness seems to be worsening per his wife and she reports that he is a diabetic.  He also reports that his blood sugars around 250 at home.  Has been taking his diabetes medications as prescribed.    Abscess      Prior to Admission medications   Medication Sig Start Date End Date Taking? Authorizing Provider  doxycycline  (VIBRAMYCIN ) 100 MG capsule Take 1 capsule (100 mg total) by mouth 2 (two) times daily. 03/11/24  Yes Lang Norleen POUR, PA-C  aspirin  81 MG tablet Take 81 mg by mouth daily.    [provider]  atorvastatin  (LIPITOR) 20 MG tablet Take 20 mg by mouth daily.    [provider]  atorvastatin  (LIPITOR) 20 MG tablet Take 1 tablet (20 mg total) by mouth daily. 04/18/23     buPROPion  (WELLBUTRIN  XL) 300 MG 24 hr tablet Take 300 mg by mouth daily.     [provider]  buPROPion  (WELLBUTRIN  XL) 300 MG 24 hr tablet Take 1 tablet (300 mg total) by mouth daily.--CALL OFFICE TO SET UP WELLNESS 01/29/24     Continuous Blood Gluc Sensor (FREESTYLE LIBRE 2 SENSOR) MISC 1 each by Other route every 14 (fourteen) days. 12/29/20   [provider]  Continuous Blood Gluc Sensor (FREESTYLE LIBRE 2 SENSOR) MISC 1 each by Does not apply route every 14 (fourteen) days. 12/29/20   [provider]  Continuous Glucose Sensor (FREESTYLE LIBRE 3 SENSOR) MISC Inject 1 each into the skin every 14 (fourteen) days. 04/07/23     Continuous Glucose Sensor (FREESTYLE LIBRE 3  SENSOR) MISC Place 1 sensor onto the skin every 14 (fourteen) days. 01/06/24     dicyclomine  (BENTYL ) 20 MG tablet Take 1 tablet (20 mg total) by mouth 2 (two) times daily as needed for spasms. 12/17/19   Towana Ozell BROCKS, MD  gabapentin  (NEURONTIN ) 300 MG capsule Take 600 mg by mouth 3 (three) times daily.    [provider]  gabapentin  (NEURONTIN ) 300 MG capsule Take 2 capsules (600 mg total) by mouth 2 (two) times daily. 05/20/23     HUMALOG  KWIKPEN 100 UNIT/ML KwikPen Inject 20 Units into the skin 3 (three) times daily. 01/06/24     ibuprofen  (ADVIL ,MOTRIN ) 200 MG tablet Take 400 mg by mouth 2 (two) times daily as needed (pain).    [provider]  insulin  aspart (NOVOLOG ) 100 UNIT/ML FlexPen Inject 30 Units into the skin daily before supper.     [provider]  insulin  degludec (TRESIBA  FLEXTOUCH) 200 UNIT/ML FlexTouch Pen Inject 120 Units into the skin daily. 01/28/23     insulin  degludec (TRESIBA  FLEXTOUCH) 200 UNIT/ML FlexTouch Pen Inject 90 Units into the skin daily. 01/06/24     insulin  glargine (LANTUS  SOLOSTAR) 100 UNIT/ML Solostar Pen Inject 70 Units into the skin 2 (two) times daily. 12/19/22     insulin  glargine (LANTUS ) 100 UNIT/ML Solostar Pen Inject 60 Units into the skin 2 (two) times daily.  [provider]  insulin  lispro (HUMALOG  KWIKPEN) 100 UNIT/ML KwikPen Inject 40 Units into the skin daily. 01/29/24     Insulin  Pen Needle (B-D ULTRAFINE III SHORT PEN) 31G X 8 MM MISC Use for insulin  pen injections 3 times a day. 04/02/23     Insulin  Pen Needle (BD PEN NEEDLE SHORT ULTRAFINE) 31G X 8 MM MISC Use for insulin  pen injections 4 times a day. 01/06/24     lisinopril  (ZESTRIL ) 10 MG tablet TAKE 1 TABLET BY MOUTH EVERY DAY 09/04/22   Shlomo Wilbert SAUNDERS, MD  lisinopril  (ZESTRIL ) 10 MG tablet TAKE 1 TABLET BY MOUTH EVERY DAY 01/29/24     methocarbamol  (ROBAXIN ) 500 MG tablet Take 1 tablet (500 mg total) by mouth 2 (two) times daily. 10/08/21   Beverley Leita LABOR,  PA-C  metoprolol  tartrate (LOPRESSOR ) 25 MG tablet Take 1 tablet (25 mg total) by mouth 2 (two) times daily. 05/24/21 08/22/21  Shlomo Wilbert SAUNDERS, MD  nitroGLYCERIN  (NITROSTAT ) 0.4 MG SL tablet Place 1 tablet (0.4 mg total) under the tongue every 5 (five) minutes as needed for chest pain. 07/24/20 01/24/21  Army Katheryn BROCKS, NP  OZEMPIC , 0.25 OR 0.5 MG/DOSE, 2 MG/3ML SOPN Inject 0.5 mg into the skin once a week. 10/10/22     tirzepatide  (MOUNJARO ) 15 MG/0.5ML Pen Inject 15 mg into the skin once a week. 08/01/23     tirzepatide  (MOUNJARO ) 15 MG/0.5ML Pen Inject 15 mg into the skin once a week. 01/06/24     traZODone  (DESYREL ) 100 MG tablet Take 100 mg by mouth at bedtime.    [provider]  TRULICITY 3 MG/0.5ML SOPN Inject 3 mg into the skin once a week. Mondays 12/01/20   [provider]  Vitamin D , Ergocalciferol , (DRISDOL ) 1.25 MG (50000 UNIT) CAPS capsule Take 1 capsule (50,000 Units total) by mouth once a week. then 2000 units otc daily 04/28/23       Allergies: Byetta 10 mcg pen [exenatide], Codeine , Invokana [canagliflozin], and Victoza [liraglutide]    Review of Systems See HPI   Physical Exam   Vitals:   03/11/24 1815  BP: (!) 143/97  Pulse: (!) 111  Resp: 16  Temp: 97.7 F (36.5 C)  SpO2: 96%    CONSTITUTIONAL:  well-appearing, NAD NEURO:  Alert and oriented x 3, CN 3-12 grossly intact EYES:  eyes equal and reactive ENT/NECK:  Supple, no stridor CARDIO:  regular rate and rhythm, appears well-perfused  PULM:  No respiratory distress, CTAB GI/GU:  non-distended, soft MSK/SPINE:  No gross deformities, no edema, moves all extremities  SKIN: Lesion noted in the right upper lateral chest wall just below the right axillary.  Tiny area of fluctuance with overlying eschar, no crepitance surrounding erythema.  See picture below.  Also inspected with ultrasound and there appeared to be a small dime sized collection of fluid just below the area of eschar.     *Additional  and/or pertinent findings included in MDM below    (all labs ordered are listed, but only abnormal results are displayed) Labs Reviewed  COMPREHENSIVE METABOLIC PANEL WITH GFR - Abnormal; Notable for the following components:      Result Value   CO2 21 (*)    Glucose, Bld 266 (*)    Alkaline Phosphatase 142 (*)    All other components within normal limits  CBG MONITORING, ED - Abnormal; Notable for the following components:   Glucose-Capillary 259 (*)    All other components within normal limits  LACTIC ACID,  PLASMA  CBC WITH DIFFERENTIAL/PLATELET  LACTIC ACID, PLASMA    EKG: None  Radiology: No results found.   .Incision and Drainage  Date/Time: 03/11/2024 9:04 PM  Performed by: Lang Norleen POUR, PA-C Authorized by: Lang Norleen POUR, PA-C   Consent:    Consent obtained:  Verbal   Consent given by:  Patient   Risks discussed:  Bleeding, incomplete drainage, pain and damage to other organs   Alternatives discussed:  No treatment Universal protocol:    Procedure explained and questions answered to patient or proxy's satisfaction: yes     Relevant documents present and verified: yes     Test results available : yes     Imaging studies available: yes     Required blood products, implants, devices, and special equipment available: yes     Site/side marked: yes     Immediately prior to procedure, a time out was called: yes     Patient identity confirmed:  Verbally with patient Location:    Type:  Abscess   Size:  0.5 inches   Location: right lateral chest. Pre-procedure details:    Skin preparation:  Betadine Anesthesia:    Anesthesia method:  Local infiltration   Local anesthetic:  Lidocaine  1% w/o epi Procedure type:    Complexity:  Complex Procedure details:    Incision types:  Single straight   Incision depth:  Subcutaneous   Wound management:  Probed and deloculated, irrigated with saline and extensive cleaning   Drainage:  Purulent and bloody   Drainage  amount:  Moderate   Packing materials:  1/4 in iodoform gauze Post-procedure details:    Procedure completion:  Tolerated well, no immediate complications    Medications Ordered in the ED  doxycycline  (VIBRAMYCIN ) 100 mg in sodium chloride  0.9 % 250 mL IVPB (100 mg Intravenous New Bag/Given 03/11/24 2010)  lidocaine  (PF) (XYLOCAINE ) 1 % injection 10 mL (10 mLs Infiltration Given 03/11/24 2012)  Tdap (BOOSTRIX ) injection 0.5 mL (0.5 mLs Intramuscular Given 03/11/24 2012)                                    Medical Decision Making Amount and/or Complexity of Data Reviewed Labs: ordered.  Risk Prescription drug management.   58 year old well-appearing male presenting for abscess.  Exam findings are concerning for abscess with surrounding cellulitis.  I&D procedure went well without complications.  Covered the area with nonadherent dressing.  Started him on doxycycline .  Workup does not suggest associated sepsis.  He looks well, nontoxic and he medically stable in no acute distress.  Feel he safe for discharge.  Sent doxycycline  to his pharmacy.  Advised him to follow-up with his PCP and discussed appropriate wound care at home.  Discharged in good condition.     Final diagnoses:  Abscess    ED Discharge Orders          Ordered    doxycycline  (VIBRAMYCIN ) 100 MG capsule  2 times daily        03/11/24 2106               Dacotah Cabello K, PA-C 03/11/24 2107    Elnor Hila P, DO 03/24/24 2353

## 2024-03-12 ENCOUNTER — Other Ambulatory Visit (HOSPITAL_BASED_OUTPATIENT_CLINIC_OR_DEPARTMENT_OTHER): Payer: Self-pay

## 2024-03-12 ENCOUNTER — Other Ambulatory Visit (HOSPITAL_COMMUNITY): Payer: Self-pay

## 2024-03-12 MED ORDER — DOXYCYCLINE HYCLATE 100 MG PO CAPS
100.0000 mg | ORAL_CAPSULE | Freq: Two times a day (BID) | ORAL | 0 refills | Status: AC
  Filled 2024-03-12: qty 20, 10d supply, fill #0

## 2024-03-23 ENCOUNTER — Other Ambulatory Visit (HOSPITAL_COMMUNITY): Payer: Self-pay

## 2024-04-01 ENCOUNTER — Other Ambulatory Visit (HOSPITAL_COMMUNITY): Payer: Self-pay

## 2024-04-12 ENCOUNTER — Other Ambulatory Visit (HOSPITAL_BASED_OUTPATIENT_CLINIC_OR_DEPARTMENT_OTHER): Payer: Self-pay

## 2024-04-14 ENCOUNTER — Other Ambulatory Visit (HOSPITAL_COMMUNITY): Payer: Self-pay

## 2024-04-26 ENCOUNTER — Other Ambulatory Visit: Payer: Self-pay

## 2024-05-10 ENCOUNTER — Other Ambulatory Visit (HOSPITAL_BASED_OUTPATIENT_CLINIC_OR_DEPARTMENT_OTHER): Payer: Self-pay

## 2024-05-11 ENCOUNTER — Other Ambulatory Visit (HOSPITAL_BASED_OUTPATIENT_CLINIC_OR_DEPARTMENT_OTHER): Payer: Self-pay

## 2024-05-11 ENCOUNTER — Encounter (HOSPITAL_BASED_OUTPATIENT_CLINIC_OR_DEPARTMENT_OTHER): Payer: Self-pay

## 2024-05-24 ENCOUNTER — Other Ambulatory Visit (HOSPITAL_COMMUNITY): Payer: Self-pay

## 2024-06-07 ENCOUNTER — Other Ambulatory Visit (HOSPITAL_BASED_OUTPATIENT_CLINIC_OR_DEPARTMENT_OTHER): Payer: Self-pay

## 2024-06-07 MED ORDER — GABAPENTIN 300 MG PO CAPS
600.0000 mg | ORAL_CAPSULE | Freq: Two times a day (BID) | ORAL | 1 refills | Status: AC
  Filled 2024-06-07: qty 360, 90d supply, fill #0

## 2024-06-14 ENCOUNTER — Other Ambulatory Visit (HOSPITAL_COMMUNITY): Payer: Self-pay

## 2024-06-15 ENCOUNTER — Other Ambulatory Visit: Payer: Self-pay

## 2024-06-15 ENCOUNTER — Other Ambulatory Visit (HOSPITAL_COMMUNITY): Payer: Self-pay

## 2024-06-15 MED ORDER — ATORVASTATIN CALCIUM 20 MG PO TABS
20.0000 mg | ORAL_TABLET | Freq: Every day | ORAL | 1 refills | Status: AC
  Filled 2024-06-15: qty 90, 90d supply, fill #0

## 2024-06-15 MED ORDER — ATORVASTATIN CALCIUM 20 MG PO TABS: 20.0000 mg | ORAL_TABLET | Freq: Every day | ORAL | 1 refills | Status: AC

## 2024-06-21 ENCOUNTER — Other Ambulatory Visit (HOSPITAL_COMMUNITY): Payer: Self-pay

## 2024-06-23 ENCOUNTER — Other Ambulatory Visit (HOSPITAL_COMMUNITY): Payer: Self-pay

## 2024-06-23 MED ORDER — LANTUS SOLOSTAR 100 UNIT/ML ~~LOC~~ SOPN
50.0000 [IU] | PEN_INJECTOR | Freq: Every day | SUBCUTANEOUS | 5 refills | Status: AC
  Filled 2024-06-23: qty 15, 30d supply, fill #0
  Filled 2024-07-21 – 2024-07-30 (×2): qty 15, 30d supply, fill #1

## 2024-06-24 ENCOUNTER — Other Ambulatory Visit (HOSPITAL_COMMUNITY): Payer: Self-pay

## 2024-07-08 ENCOUNTER — Other Ambulatory Visit (HOSPITAL_COMMUNITY): Payer: Self-pay

## 2024-07-21 ENCOUNTER — Other Ambulatory Visit (HOSPITAL_COMMUNITY): Payer: Self-pay

## 2024-07-30 ENCOUNTER — Other Ambulatory Visit: Payer: Self-pay

## 2024-07-30 ENCOUNTER — Other Ambulatory Visit (HOSPITAL_BASED_OUTPATIENT_CLINIC_OR_DEPARTMENT_OTHER): Payer: Self-pay

## 2024-07-30 MED ORDER — MOUNJARO 15 MG/0.5ML ~~LOC~~ SOAJ
SUBCUTANEOUS | 0 refills | Status: AC
  Filled 2024-07-30: qty 6, 84d supply, fill #0

## 2024-08-17 ENCOUNTER — Other Ambulatory Visit (HOSPITAL_BASED_OUTPATIENT_CLINIC_OR_DEPARTMENT_OTHER): Payer: Self-pay
